# Patient Record
Sex: Female | Born: 1960
Health system: Southern US, Community
[De-identification: ages and names within clinical notes are randomized; demographics above are authoritative.]

## PROBLEM LIST (undated history)

## (undated) DIAGNOSIS — K769 Liver disease, unspecified: Secondary | ICD-10-CM

## (undated) DIAGNOSIS — F419 Anxiety disorder, unspecified: Secondary | ICD-10-CM

## (undated) DIAGNOSIS — D649 Anemia, unspecified: Secondary | ICD-10-CM

## (undated) DIAGNOSIS — E7800 Pure hypercholesterolemia, unspecified: Secondary | ICD-10-CM

## (undated) DIAGNOSIS — Z803 Family history of malignant neoplasm of breast: Secondary | ICD-10-CM

## (undated) DIAGNOSIS — G4700 Insomnia, unspecified: Secondary | ICD-10-CM

## (undated) DIAGNOSIS — E785 Hyperlipidemia, unspecified: Secondary | ICD-10-CM

## (undated) DIAGNOSIS — R12 Heartburn: Secondary | ICD-10-CM

## (undated) DIAGNOSIS — Z800 Family history of malignant neoplasm of digestive organs: Secondary | ICD-10-CM

## (undated) DIAGNOSIS — K5900 Constipation, unspecified: Secondary | ICD-10-CM

## (undated) DIAGNOSIS — D472 Monoclonal gammopathy: Secondary | ICD-10-CM

## (undated) DIAGNOSIS — R232 Flushing: Secondary | ICD-10-CM

## (undated) DIAGNOSIS — I10 Essential (primary) hypertension: Secondary | ICD-10-CM

## (undated) DIAGNOSIS — R002 Palpitations: Secondary | ICD-10-CM

## (undated) DIAGNOSIS — M792 Neuralgia and neuritis, unspecified: Secondary | ICD-10-CM

## (undated) DIAGNOSIS — R0602 Shortness of breath: Secondary | ICD-10-CM

## (undated) DIAGNOSIS — R079 Chest pain, unspecified: Secondary | ICD-10-CM

## (undated) DIAGNOSIS — E559 Vitamin D deficiency, unspecified: Secondary | ICD-10-CM

## (undated) DIAGNOSIS — R011 Cardiac murmur, unspecified: Secondary | ICD-10-CM

## (undated) DIAGNOSIS — M2550 Pain in unspecified joint: Secondary | ICD-10-CM

## (undated) DIAGNOSIS — N6320 Unspecified lump in the left breast, unspecified quadrant: Secondary | ICD-10-CM

## (undated) DIAGNOSIS — Z8041 Family history of malignant neoplasm of ovary: Secondary | ICD-10-CM

## (undated) HISTORY — DX: Anemia, unspecified: D64.9

## (undated) HISTORY — DX: Chest pain, unspecified: R07.9

## (undated) HISTORY — DX: Flushing: R23.2

## (undated) HISTORY — DX: Palpitations: R00.2

## (undated) HISTORY — DX: Shortness of breath: R06.02

## (undated) HISTORY — PX: OTHER SURGICAL HISTORY: SHX169

## (undated) HISTORY — DX: Unspecified lump in the left breast, unspecified quadrant: N63.20

## (undated) HISTORY — DX: Cardiac murmur, unspecified: R01.1

## (undated) HISTORY — DX: Hyperlipidemia, unspecified: E78.5

## (undated) HISTORY — DX: Pain in unspecified joint: M25.50

## (undated) HISTORY — DX: Insomnia, unspecified: G47.00

## (undated) HISTORY — DX: Liver disease, unspecified: K76.9

## (undated) HISTORY — PX: OOPHORECTOMY: SHX86

## (undated) HISTORY — DX: Family history of malignant neoplasm of digestive organs: Z80.0

## (undated) HISTORY — DX: Constipation, unspecified: K59.00

## (undated) HISTORY — DX: Family history of malignant neoplasm of breast: Z80.3

## (undated) HISTORY — PX: HYSTERECTOMY ABDOMINAL WITH SALPINGECTOMY: SHX6725

## (undated) HISTORY — DX: Vitamin D deficiency, unspecified: E55.9

## (undated) HISTORY — DX: Essential (primary) hypertension: I10

## (undated) HISTORY — DX: Neuralgia and neuritis, unspecified: M79.2

## (undated) HISTORY — DX: Pure hypercholesterolemia, unspecified: E78.00

## (undated) HISTORY — DX: Heartburn: R12

## (undated) HISTORY — DX: Family history of malignant neoplasm of ovary: Z80.41

## (undated) HISTORY — DX: Monoclonal gammopathy: D47.2

## (undated) HISTORY — DX: Anxiety disorder, unspecified: F41.9

---

## 1980-06-06 HISTORY — PX: APPENDECTOMY: SHX54

## 1997-06-06 HISTORY — PX: BREAST BIOPSY: SHX20

## 1998-06-06 HISTORY — PX: OVARY SURGERY: SHX727

## 1999-06-07 HISTORY — PX: ABDOMINAL HYSTERECTOMY: SHX81

## 2004-09-17 ENCOUNTER — Ambulatory Visit: Payer: Self-pay | Admitting: Internal Medicine

## 2004-11-02 ENCOUNTER — Ambulatory Visit: Payer: Self-pay | Admitting: Internal Medicine

## 2004-11-18 ENCOUNTER — Ambulatory Visit: Payer: Self-pay | Admitting: Internal Medicine

## 2005-07-05 ENCOUNTER — Ambulatory Visit: Payer: Self-pay | Admitting: Unknown Physician Specialty

## 2006-07-25 ENCOUNTER — Ambulatory Visit: Payer: Self-pay | Admitting: Internal Medicine

## 2007-08-02 ENCOUNTER — Ambulatory Visit: Payer: Self-pay | Admitting: Unknown Physician Specialty

## 2007-09-20 ENCOUNTER — Ambulatory Visit: Payer: Self-pay | Admitting: Unknown Physician Specialty

## 2007-09-25 ENCOUNTER — Inpatient Hospital Stay: Payer: Self-pay | Admitting: Unknown Physician Specialty

## 2008-10-15 ENCOUNTER — Ambulatory Visit: Payer: Self-pay | Admitting: Unknown Physician Specialty

## 2010-04-21 ENCOUNTER — Ambulatory Visit: Payer: Self-pay | Admitting: Unknown Physician Specialty

## 2011-05-18 ENCOUNTER — Ambulatory Visit: Payer: Self-pay | Admitting: Unknown Physician Specialty

## 2012-06-13 ENCOUNTER — Ambulatory Visit: Payer: Self-pay | Admitting: General Surgery

## 2012-06-13 HISTORY — PX: COLONOSCOPY: SHX174

## 2014-06-06 DIAGNOSIS — E559 Vitamin D deficiency, unspecified: Secondary | ICD-10-CM

## 2014-06-06 HISTORY — DX: Vitamin D deficiency, unspecified: E55.9

## 2014-08-15 ENCOUNTER — Encounter: Payer: Self-pay | Admitting: *Deleted

## 2014-08-25 ENCOUNTER — Encounter: Payer: Self-pay | Admitting: General Surgery

## 2014-08-26 ENCOUNTER — Ambulatory Visit (INDEPENDENT_AMBULATORY_CARE_PROVIDER_SITE_OTHER): Payer: 59 | Admitting: General Surgery

## 2014-08-26 ENCOUNTER — Encounter: Payer: Self-pay | Admitting: General Surgery

## 2014-08-26 VITALS — BP 130/70 | HR 76 | Resp 14 | Ht 62.0 in | Wt 175.0 lb

## 2014-08-26 DIAGNOSIS — R195 Other fecal abnormalities: Secondary | ICD-10-CM | POA: Diagnosis not present

## 2014-08-26 NOTE — Progress Notes (Signed)
Patient ID: Andrea Jones, female   DOB: 08/02/1960, 54 y.o.   MRN: [id number]  Chief Complaint  Patient presents with  . Other    positive hemmocult    HPI Andrea Jones is a 54 y.o. female. Here today for evaluation of blood in stool. Patient saw Dr. Ammie Dalton in [place]. Positive  Hemoccult. Repeat stool sample positive on [facility] testing  Patient has not noticed any blood in stool, No GI problems at this time. Change in bowel movements. The patient has no significant history of gastroesophageal reflux. No history of abdominal pain, distention. HPI  Past Medical History  Diagnosis Date  . Hyperlipidemia   . Vitamin D deficiency 2016    Past Surgical History  Procedure Laterality Date  . Appendectomy  1982  . Abdominal hysterectomy  2001  . Ovary surgery  2000  . Breast biopsy Left 1999    cyst  . Cesarean section  1985  . Colonoscopy  06/13/2012    [facility]. Normal Exam.    Family History  Problem Relation Age of Onset  . Colon cancer Father 45    Social History History  Substance Use Topics  . Smoking status: Never Smoker   . Smokeless tobacco: Not on file  . Alcohol Use: No    No Known Allergies  Current Outpatient Prescriptions  Medication Sig Dispense Refill  . Vitamin D, Ergocalciferol, (DRISDOL) 50000 UNITS CAPS capsule Take 50,000 Units by mouth every 7 (seven) days.     No current facility-administered medications for this visit.    Review of Systems Review of Systems  Constitutional: Negative.   Respiratory: Negative.   Cardiovascular: Negative.   Gastrointestinal: Positive for blood in stool. Negative for nausea, vomiting, abdominal pain, diarrhea, constipation, abdominal distention, anal bleeding and rectal pain.    Blood pressure 130/70, pulse 76, resp. rate 14, height 5\' 2"  (1.575 m), weight 175 lb (79.379 kg).  Physical Exam Physical Exam  Constitutional: She appears well-developed and well-nourished.  Eyes: Conjunctivae are normal.  Pupils are equal, round, and reactive to light.  Cardiovascular: Normal rate, regular rhythm and normal heart sounds.   Pulmonary/Chest: Effort normal and breath sounds normal.    Data Reviewed GYN notes.  Assessment    Heme-positive stools, unlikely colonic source.    Plan    In light of a normal colonoscopy just over 2 years ago, I think it's highly unlikely that were dealing with colonic source. Prior to beginning an upper GI/small bowel investigation the patient will complete stool Hemoccult cards 6. Dietary restrictions were reviewed with the patient. If these are negative would postpone further assessment. If positive will undertake upper endoscopy/SBFT.  6 samples hemoccult cards.     Referred by: Dr. Carollee Leitz 08/27/2014, 6:57 AM

## 2014-08-26 NOTE — Patient Instructions (Signed)
Patient to bring hemoccult cards TBA.

## 2014-08-27 ENCOUNTER — Encounter: Payer: Self-pay | Admitting: General Surgery

## 2014-08-27 DIAGNOSIS — R195 Other fecal abnormalities: Secondary | ICD-10-CM | POA: Insufficient documentation

## 2017-12-29 ENCOUNTER — Ambulatory Visit (INDEPENDENT_AMBULATORY_CARE_PROVIDER_SITE_OTHER): Payer: Managed Care, Other (non HMO) | Admitting: Obstetrics & Gynecology

## 2017-12-29 ENCOUNTER — Encounter: Payer: Self-pay | Admitting: Obstetrics & Gynecology

## 2017-12-29 VITALS — BP 140/80 | Ht 62.0 in | Wt 175.0 lb

## 2017-12-29 DIAGNOSIS — Z1211 Encounter for screening for malignant neoplasm of colon: Secondary | ICD-10-CM

## 2017-12-29 DIAGNOSIS — Z1239 Encounter for other screening for malignant neoplasm of breast: Secondary | ICD-10-CM

## 2017-12-29 DIAGNOSIS — Z01419 Encounter for gynecological examination (general) (routine) without abnormal findings: Secondary | ICD-10-CM

## 2017-12-29 DIAGNOSIS — Z1231 Encounter for screening mammogram for malignant neoplasm of breast: Secondary | ICD-10-CM | POA: Diagnosis not present

## 2017-12-29 DIAGNOSIS — Z13220 Encounter for screening for lipoid disorders: Secondary | ICD-10-CM

## 2017-12-29 DIAGNOSIS — Z1329 Encounter for screening for other suspected endocrine disorder: Secondary | ICD-10-CM

## 2017-12-29 DIAGNOSIS — Z131 Encounter for screening for diabetes mellitus: Secondary | ICD-10-CM

## 2017-12-29 DIAGNOSIS — Z0000 Encounter for general adult medical examination without abnormal findings: Secondary | ICD-10-CM

## 2017-12-29 DIAGNOSIS — Z8041 Family history of malignant neoplasm of ovary: Secondary | ICD-10-CM

## 2017-12-29 DIAGNOSIS — Z1321 Encounter for screening for nutritional disorder: Secondary | ICD-10-CM

## 2017-12-29 DIAGNOSIS — Z124 Encounter for screening for malignant neoplasm of cervix: Secondary | ICD-10-CM

## 2017-12-29 DIAGNOSIS — Z803 Family history of malignant neoplasm of breast: Secondary | ICD-10-CM

## 2017-12-29 NOTE — Patient Instructions (Signed)
PAP every three years Mammogram every year    Call [phone] to schedule at [facility] Colonoscopy every 10 years Labs soon

## 2017-12-29 NOTE — Progress Notes (Signed)
HPI:      Andrea Jones is a 57 y.o. [phone] who LMP was in the past, h/o [place] BSO years ago, she presents today for her annual examination.  The patient has no complaints today. The patient is sexually active. Herlast pap: approximate date 2016 and was normal and last mammogram: approximate date 2016 and was normal. HPV in past.  The patient does perform self breast exams.  There is notable family history of breast or ovarian cancer in her family. The patient is not taking hormone replacement therapy. Patient denies post-menopausal vaginal bleeding.   The patient has regular exercise: yes. The patient denies current symptoms of depression.    GYN Hx: Last Colonoscopy:5 years ago. Normal.  Last DEXA: never ago.    PMHx: Past Medical History:  Diagnosis Date  . Heart murmur   . Hot flashes   . Hypercholesteremia   . Hyperlipidemia   . Insomnia   . Vitamin D deficiency 2016   Past Surgical History:  Procedure Laterality Date  . ABDOMINAL HYSTERECTOMY  2001  . APPENDECTOMY  1982  . BREAST BIOPSY Left 1999   cyst  . [place]  . COLONOSCOPY  06/13/2012   [facility]. Normal Exam.  . HYSTERECTOMY ABDOMINAL WITH SALPINGECTOMY    . OVARY SURGERY  2000   Family History  Problem Relation Age of Onset  . Colon cancer Father 106  . Heart disease Father   . Congestive Heart Failure Mother   . Emphysema Mother   . Hypertension Mother   . Kidney failure Mother   . Ovarian cancer Sister 81  . Breast cancer Cousin 88   Social History   Tobacco Use  . Smoking status: Never Smoker  . Smokeless tobacco: Never Used  Substance Use Topics  . Alcohol use: No    Alcohol/week: 0.0 oz  . Drug use: No    Current Outpatient Medications:  Marland Kitchen  Vitamin D, Ergocalciferol, (DRISDOL) 50000 UNITS CAPS capsule, Take 50,000 Units by mouth every 7 (seven) days., Disp: , Rfl:  Allergies: Patient has no known allergies.  Review of Systems  Constitutional: Negative for chills, fever  and malaise/fatigue.  HENT: Negative for congestion, sinus pain and sore throat.   Eyes: Negative for blurred vision and pain.  Respiratory: Negative for cough and wheezing.   Cardiovascular: Negative for chest pain and leg swelling.  Gastrointestinal: Negative for abdominal pain, constipation, diarrhea, heartburn, nausea and vomiting.  Genitourinary: Negative for dysuria, frequency, hematuria and urgency.  Musculoskeletal: Negative for back pain, joint pain, myalgias and neck pain.  Skin: Negative for itching and rash.  Neurological: Negative for dizziness, tremors and weakness.  Endo/Heme/Allergies: Does not bruise/bleed easily.  Psychiatric/Behavioral: Negative for depression. The patient is not nervous/anxious and does not have insomnia.     Objective: BP 140/80   Ht 5\' 2"  (1.575 m)   Wt 175 lb (79.4 kg)   BMI 32.01 kg/m   Filed Weights   12/29/17 1449  Weight: 175 lb (79.4 kg)   Body mass index is 32.01 kg/m. Physical Exam  Constitutional: She is oriented to person, place, and time. She appears well-developed and well-nourished. No distress.  Genitourinary: Rectum normal and vagina normal. Pelvic exam was performed with patient supine. There is no rash or lesion on the right labia. There is no rash or lesion on the left labia. Vagina exhibits no lesion. No bleeding in the vagina. Right adnexum does not display mass and does not display tenderness. Left  adnexum does not display mass and does not display tenderness. Cervix does not exhibit motion tenderness, lesion or discharge.  Genitourinary Comments: Absent Uterus  HENT:  Head: Normocephalic and atraumatic. Head is without laceration.  Right Ear: Hearing normal.  Left Ear: Hearing normal.  Nose: No epistaxis.  No foreign bodies.  Mouth/Throat: Uvula is midline, oropharynx is clear and moist and mucous membranes are normal.  Eyes: Pupils are equal, round, and reactive to light.  Neck: Normal range of motion. Neck supple. No  thyromegaly present.  Cardiovascular: Normal rate and regular rhythm. Exam reveals no gallop and no friction rub.  No murmur heard. Pulmonary/Chest: Effort normal and breath sounds normal. No respiratory distress. She has no wheezes. Right breast exhibits no mass, no skin change and no tenderness. Left breast exhibits no mass, no skin change and no tenderness.  Abdominal: Soft. Bowel sounds are normal. She exhibits no distension. There is no tenderness. There is no rebound.  Musculoskeletal: Normal range of motion.  Neurological: She is alert and oriented to person, place, and time. No cranial nerve deficit.  Skin: Skin is warm and dry.  Psychiatric: She has a normal mood and affect. Judgment normal.  Vitals reviewed.  Assessment: Annual Exam 1. Annual physical exam   2. Screening for breast cancer   3. Screening for cervical cancer   4. Screen for colon cancer   5. Screening for diabetes mellitus   6. Screening for cholesterol level   7. Screening for thyroid disorder   8. Encounter for vitamin deficiency screening    Plan:            1.  Cervical Screening-  Pap smear done today  2. Breast screening- Exam annually and mammogram scheduled  3. Colonoscopy every 10 years, Hemoccult testing after age 102  4. Labs To return fasting at a later date  5. Counseling for hormonal therapy: none    F/U  Return in about 1 year (around 12/30/2018) for Annual.  Barnett Applebaum, MD, [facility], [facility] 12/29/2017  3:15 PM

## 2018-01-04 ENCOUNTER — Telehealth: Payer: Self-pay | Admitting: Obstetrics & Gynecology

## 2018-01-04 LAB — IGP, APTIMA HPV
HPV APTIMA: POSITIVE — AB
PAP SMEAR COMMENT: 0

## 2018-01-04 LAB — FECAL OCCULT BLOOD, IMMUNOCHEMICAL: FECAL OCCULT BLD: NEGATIVE

## 2018-01-04 LAB — SPECIMEN STATUS REPORT

## 2018-01-04 NOTE — Progress Notes (Signed)
Sch Colpo w [facility]; pt aware

## 2018-01-04 NOTE — Telephone Encounter (Signed)
-----   Message from [name], MD sent at 01/04/2018  2:47 PM EDT ----- Sch Colpo w [place]; pt aware

## 2018-01-04 NOTE — Telephone Encounter (Signed)
Patient is schedule 01/19/18 with [facility]

## 2018-01-16 ENCOUNTER — Ambulatory Visit
Admission: RE | Admit: 2018-01-16 | Discharge: 2018-01-16 | Disposition: A | Discharge status: Home / Self Care | Payer: Managed Care, Other (non HMO) | Source: Ambulatory Visit | Attending: Obstetrics & Gynecology | Admitting: Obstetrics & Gynecology

## 2018-01-16 DIAGNOSIS — Z1231 Encounter for screening mammogram for malignant neoplasm of breast: Secondary | ICD-10-CM | POA: Insufficient documentation

## 2018-01-16 DIAGNOSIS — Z1239 Encounter for other screening for malignant neoplasm of breast: Secondary | ICD-10-CM

## 2018-01-19 ENCOUNTER — Other Ambulatory Visit (HOSPITAL_COMMUNITY)
Admission: RE | Admit: 2018-01-19 | Discharge: 2018-01-19 | Disposition: A | Discharge status: Home / Self Care | Payer: Managed Care, Other (non HMO) | Source: Ambulatory Visit | Attending: Obstetrics & Gynecology | Admitting: Obstetrics & Gynecology

## 2018-01-19 ENCOUNTER — Encounter: Payer: Self-pay | Admitting: Obstetrics & Gynecology

## 2018-01-19 ENCOUNTER — Ambulatory Visit (INDEPENDENT_AMBULATORY_CARE_PROVIDER_SITE_OTHER): Payer: Managed Care, Other (non HMO) | Admitting: Obstetrics & Gynecology

## 2018-01-19 VITALS — BP 130/80 | Ht 62.0 in | Wt 172.0 lb

## 2018-01-19 DIAGNOSIS — R87611 Atypical squamous cells cannot exclude high grade squamous intraepithelial lesion on cytologic smear of cervix (ASC-H): Secondary | ICD-10-CM | POA: Insufficient documentation

## 2018-01-19 NOTE — Patient Instructions (Signed)

## 2018-01-19 NOTE — Progress Notes (Signed)
HPI:  Andrea Jones is a 57 y.o.  [phone]  who presents today for evaluation and management of abnormal cervical cytology.    Dysplasia History:  ASC-H, HPV+ by recent PAP    Last PAP 2017 HPV+ only    Prior PAP 2016 LGSIL, Colpo and biopsy revealed CIN I  ROS:  Pertinent items noted in HPI and remainder of comprehensive ROS otherwise negative.  OB History  Gravida Para Term Preterm AB Living  3 3 3     3   SAB TAB Ectopic Multiple Live Births               # Outcome Date GA Lbr Len/2nd Weight Sex Delivery Anes PTL Lv  3 Term 05/24/89   8 lb 2.6 oz (3.701 kg) M CS-Unspec     2 Term 12/06/86   7 lb 1.8 oz (3.225 kg) F Vag-Spont     1 Term 01/21/84   8 lb 9.6 oz (3.901 kg) F Vag-Spont       Past Medical History:  Diagnosis Date  . Heart murmur   . Hot flashes   . Hypercholesteremia   . Hyperlipidemia   . Insomnia   . Vitamin D deficiency 2016    Past Surgical History:  Procedure Laterality Date  . ABDOMINAL HYSTERECTOMY  2001  . APPENDECTOMY  1982  . BREAST BIOPSY Left 1999   cyst,benign  . CESAREAN SECTION  1985  . COLONOSCOPY  06/13/2012   [facility]. Normal Exam.  . HYSTERECTOMY ABDOMINAL WITH SALPINGECTOMY    . OOPHORECTOMY    . OVARY SURGERY  2000    SOCIAL HISTORY: Social History   Substance and Sexual Activity  Alcohol Use No  . Alcohol/week: 0.0 standard drinks   Social History   Substance and Sexual Activity  Drug Use No     Family History  Problem Relation Age of Onset  . Colon cancer Father 45  . Heart disease Father   . Congestive Heart Failure Mother   . Emphysema Mother   . Hypertension Mother   . Kidney failure Mother   . Ovarian cancer Sister 2  . Breast cancer Cousin 46    ALLERGIES:  Patient has no known allergies.  Current Outpatient Medications on File Prior to Visit  Medication Sig Dispense Refill  . Vitamin D, Ergocalciferol, (DRISDOL) 50000 UNITS CAPS capsule Take 50,000 Units by mouth every 7 (seven) days.      No current facility-administered medications on file prior to visit.     Physical Exam: -Vitals:  There were no vitals taken for this visit. GEN: WD, WN, NAD.  A+ O x 3, good mood and affect. ABD:  NT, ND.  Soft, no masses.  No hernias noted.   Pelvic:   Vulva: Normal appearance.  No lesions.  Vagina: No lesions or abnormalities noted.  Support: Normal pelvic support.  Urethra No masses tenderness or scarring.  Meatus Normal size without lesions or prolapse.  Cervix: See below.  Anus: Normal exam.  No lesions.  Perineum: Normal exam.  No lesions.        Bimanual   Uterus: Normal size.  Non-tender.  Mobile.  AV.  Adnexae: No masses.  Non-tender to palpation.  Cul-de-sac: Negative for abnormality.   PROCEDURE: 1.  Urine Pregnancy Test:  not done 2.  Colposcopy performed with 4% acetic acid after verbal consent obtained                                         -  Aceto-white Lesions Location(s): none.              -Biopsy performed at 6, 12 o'clock               -ECC indicated and performed: Yes.       -Biopsy sites made hemostatic with pressure, AgNO3, and/or Monsel's solution   -Satisfactory colposcopy: Yes.      -Evidence of Invasive cervical CA :  NO  ASSESSMENT:  Andrea Jones is a 57 y.o. [phone] here for  1. Atypical squamous cells cannot exclude high grade squamous intraepithelial lesion on cytologic smear of cervix (ASC-H)   .  PLAN: 1.  I discussed the grading system of pap smears and HPV high risk viral types.  We will discuss and base management after colpo results return. 2. Follow up PAP 6 months, vs intervention if high grade dysplasia identified 3. Treatment of persistantly abnormal PAP smears and cervical dysplasia, even mild, is discussed w pt today in detail, as well as the pros and cons of Cryo and LEEP and even Trachelectomy procedures. Will consider and discuss after results.      Barnett Applebaum, MD, [facility], [facility] 01/19/2018  7:53 AM

## 2018-01-22 ENCOUNTER — Other Ambulatory Visit: Payer: Self-pay | Admitting: Obstetrics & Gynecology

## 2018-01-22 NOTE — Progress Notes (Signed)
Pathology from Colposcopy/ Biopsy recently done: Diagnosis 1. Endocervix, curettage - FRAGMENTS OF DYSPLASTIC SQUAMOUS EPITHELIUM. - BENIGN ENDOCERVICAL TYPE MUCOSA. - SEE COMMENT. 2. Cervix, biopsy, at 6 o'clock - BENIGN SQUAMOUS MUCOSA. - THERE IS NO EVIDENCE OF MALIGNANCY. - SEE COMMENT. 3. Cervix, biopsy, at 12 o'clock - INFLAMED TRANSITIONAL ZONE MUCOSA. - THERE IS NO EVIDENCE OF MALIGNANCY. D/W pt, f/u PAP 6 mos  Barnett Applebaum, MD, [place], [facility] 01/22/2018  1:23 PM

## 2018-01-22 NOTE — Progress Notes (Signed)
LM

## 2018-01-24 ENCOUNTER — Encounter: Payer: Self-pay | Admitting: Obstetrics and Gynecology

## 2018-02-08 ENCOUNTER — Encounter: Payer: Self-pay | Admitting: Obstetrics & Gynecology

## 2018-07-13 ENCOUNTER — Encounter: Payer: Self-pay | Admitting: Obstetrics & Gynecology

## 2018-07-13 ENCOUNTER — Ambulatory Visit (INDEPENDENT_AMBULATORY_CARE_PROVIDER_SITE_OTHER): Payer: 59 | Admitting: Obstetrics & Gynecology

## 2018-07-13 ENCOUNTER — Other Ambulatory Visit (HOSPITAL_COMMUNITY)
Admission: RE | Admit: 2018-07-13 | Discharge: 2018-07-13 | Disposition: A | Discharge status: Home / Self Care | Payer: 59 | Source: Ambulatory Visit | Attending: Obstetrics & Gynecology | Admitting: Obstetrics & Gynecology

## 2018-07-13 VITALS — BP 138/90 | Ht 62.0 in | Wt 178.0 lb

## 2018-07-13 DIAGNOSIS — R87611 Atypical squamous cells cannot exclude high grade squamous intraepithelial lesion on cytologic smear of cervix (ASC-H): Secondary | ICD-10-CM | POA: Insufficient documentation

## 2018-07-13 NOTE — Progress Notes (Signed)
HPI:  Patient is a 58 y.o. [id number] presenting for follow up evaluation of abnormal PAP smear in the past.  Prior [facility].    Last PAP 01/2018 ASC-H, HPV+       Colpo/Bx- 1. Endocervix, curettage - FRAGMENTS OF DYSPLASTIC SQUAMOUS EPITHELIUM. - BENIGN ENDOCERVICAL TYPE MUCOSA. - SEE COMMENT. 2. Cervix, biopsy, at 6 o'clock - BENIGN SQUAMOUS MUCOSA. - THERE IS NO EVIDENCE OF MALIGNANCY. - SEE COMMENT. 3. Cervix, biopsy, at 12 o'clock - INFLAMED TRANSITIONAL ZONE MUCOSA. - THERE IS NO EVIDENCE OF MALIGNANCY.        Prior PAP 2017 HPV+ only    Prior PAP 2016 LGSIL, Colpo and biopsy revealed CIN I  PMHx: She  has a past medical history of Family history of ovarian cancer, Heart murmur, Hot flashes, Hypercholesteremia, Hyperlipidemia, Insomnia, and Vitamin D deficiency (2016). Also,  has a past surgical history that includes Appendectomy (1982); Abdominal hysterectomy (2001); Ovary surgery (2000); Cesarean section (1985); Colonoscopy (06/13/2012); Hysterectomy abdominal with salpingectomy; Oophorectomy; and Breast biopsy (Left, 1999)., family history includes Breast cancer (age of onset: 42) in her cousin; Colon cancer (age of onset: 65) in her father; Congestive Heart Failure in her mother; Emphysema in her mother; Heart disease in her father; Hypertension in her mother; Kidney failure in her mother; Ovarian cancer (age of onset: 59) in her sister.,  reports that she has never smoked. She has never used smokeless tobacco. She reports that she does not drink alcohol or use drugs.  She has a current medication list which includes the following prescription(s): vitamin d (ergocalciferol). Also, has No Known Allergies.  Review of Systems  All other systems reviewed and are negative.  Objective: BP 138/90   Ht 5\' 2"  (1.575 m)   Wt 178 lb (80.7 kg)   BMI 32.56 kg/m  Filed Weights   07/13/18 0811  Weight: 178 lb (80.7 kg)   Body mass index is 32.56 kg/m.  Physical examination Physical  Exam Constitutional:      General: She is not in acute distress.    Appearance: She is well-developed.  Genitourinary:     Pelvic exam was performed with patient supine.     Vagina normal.     No vaginal erythema or bleeding.     No cervical motion tenderness, discharge, polyp or nabothian cyst.     Uterus is absent.  HENT:     Head: Normocephalic and atraumatic.     Nose: Nose normal.  Abdominal:     General: There is no distension.     Palpations: Abdomen is soft.     Tenderness: There is no abdominal tenderness.  Musculoskeletal: Normal range of motion.  Neurological:     Mental Status: She is alert and oriented to person, place, and time.     Cranial Nerves: No cranial nerve deficit.  Skin:    General: Skin is warm and dry.   ASSESSMENT:  History of Cervical Dysplasia, Prior CIN I  Plan:  1.  I discussed the grading system of pap smears and HPV high risk viral types.   2. Follow up PAP 6 months, vs intervention if high grade dysplasia identified. 3. Treatment of persistantly abnormal PAP smears and cervical dysplasia, even mild, is discussed w pt today in detail, as well as the pros and cons of Cryo and LEEP procedures. Will consider and discuss after results.  A total of 15 minutes were spent face-to-face with the patient during this encounter and over half of that time dealt  with counseling and coordination of care.  Barnett Applebaum, MD, [facility], [facility] 07/13/2018  8:13 AM

## 2018-07-17 LAB — CYTOLOGY - PAP: Diagnosis: NEGATIVE

## 2019-01-10 ENCOUNTER — Other Ambulatory Visit: Payer: Self-pay

## 2019-01-10 ENCOUNTER — Encounter: Payer: Self-pay | Admitting: Obstetrics & Gynecology

## 2019-01-10 ENCOUNTER — Ambulatory Visit (INDEPENDENT_AMBULATORY_CARE_PROVIDER_SITE_OTHER): Payer: 59 | Admitting: Obstetrics & Gynecology

## 2019-01-10 VITALS — BP 120/90 | Ht 62.0 in | Wt 174.8 lb

## 2019-01-10 DIAGNOSIS — G629 Polyneuropathy, unspecified: Secondary | ICD-10-CM

## 2019-01-10 DIAGNOSIS — Z01419 Encounter for gynecological examination (general) (routine) without abnormal findings: Secondary | ICD-10-CM

## 2019-01-10 DIAGNOSIS — Z1239 Encounter for other screening for malignant neoplasm of breast: Secondary | ICD-10-CM

## 2019-01-10 DIAGNOSIS — R87611 Atypical squamous cells cannot exclude high grade squamous intraepithelial lesion on cytologic smear of cervix (ASC-H): Secondary | ICD-10-CM

## 2019-01-10 DIAGNOSIS — Z1211 Encounter for screening for malignant neoplasm of colon: Secondary | ICD-10-CM

## 2019-01-10 NOTE — Patient Instructions (Addendum)
PAP every year Mammogram every year    Call [phone] to schedule at [facility] Colonoscopy every 10 years Labs yearly (with PCP or [facility])  Referral - Neurology Dr Jaynee Eagles (or partner)

## 2019-01-10 NOTE — Progress Notes (Signed)
HPI:      [name] is a 58 y.o. [phone] who LMP was in the past as she has had prior LSH/BSO, she presents today for her annual examination.  The patient has no complaints today. The patient is sexually active. Herlast pap: approximate date 07/2018 and was normal and last year PAP was ASC-H (w normal biopsies) and last mammogram: approximate date 2019 and was normal.  The patient does perform self breast exams.  There is no notable family history of breast or ovarian cancer in her family. The patient is not taking hormone replacement therapy. Patient denies post-menopausal vaginal bleeding.   The patient has regular exercise: yes. The patient denies current symptoms of depression.    Reports over the last year bilateral numbness and tingling of the big toes that is intermittent and not associated with anything. Normal testing for diabetes yearly.  FH (mother) of peripheral neuropathy so is concerned.  GYN Hx: Last Colonoscopy:8 yrs ago. Normal.  Last DEXA: never ago.    PMHx: Past Medical History:  Diagnosis Date  . Family history of ovarian cancer    8/19 genetic testing letter sent  . Heart murmur   . Hot flashes   . Hypercholesteremia   . Hyperlipidemia   . Insomnia   . Vitamin D deficiency 2016   Past Surgical History:  Procedure Laterality Date  . ABDOMINAL HYSTERECTOMY  2001  . APPENDECTOMY  1982  . BREAST BIOPSY Left 1999   cyst,benign  . CESAREAN SECTION  1985  . COLONOSCOPY  06/13/2012   [facility]. Normal Exam.  . HYSTERECTOMY ABDOMINAL WITH SALPINGECTOMY    . OOPHORECTOMY    . OVARY SURGERY  2000   Family History  Problem Relation Age of Onset  . Colon cancer Father 55  . Heart disease Father   . Congestive Heart Failure Mother   . Emphysema Mother   . Hypertension Mother   . Kidney failure Mother   . Ovarian cancer Sister 60       pat 1/2 sister  . Breast cancer Cousin 25   Social History   Tobacco Use  . Smoking status: Never Smoker  .  Smokeless tobacco: Never Used  Substance Use Topics  . Alcohol use: No    Alcohol/week: 0.0 standard drinks  . Drug use: No    Current Outpatient Medications:  Marland Kitchen  Vitamin D, Ergocalciferol, (DRISDOL) 50000 UNITS CAPS capsule, Take 50,000 Units by mouth every 7 (seven) days., Disp: , Rfl:  Allergies: Patient has no known allergies.  Review of Systems  Constitutional: Negative for chills, fever and malaise/fatigue.  HENT: Negative for congestion, sinus pain and sore throat.   Eyes: Positive for blurred vision. Negative for pain.  Respiratory: Negative for cough and wheezing.   Cardiovascular: Negative for chest pain and leg swelling.  Gastrointestinal: Negative for abdominal pain, constipation, diarrhea, heartburn, nausea and vomiting.  Genitourinary: Negative for dysuria, frequency, hematuria and urgency.  Musculoskeletal: Negative for back pain, joint pain, myalgias and neck pain.  Skin: Negative for itching and rash.  Neurological: Positive for tingling. Negative for dizziness, tremors and weakness.  Endo/Heme/Allergies: Does not bruise/bleed easily.  Psychiatric/Behavioral: Negative for depression. The patient is not nervous/anxious and does not have insomnia.     Objective: BP 120/90   Ht 5\' 2"  (1.575 m)   Wt 174 lb 12.8 oz (79.3 kg)   BMI 31.97 kg/m   Filed Weights   01/10/19 0805  Weight: 174 lb 12.8 oz (79.3 kg)  Body mass index is 31.97 kg/m. Physical Exam Constitutional:      General: She is not in acute distress.    Appearance: She is well-developed.  Genitourinary:     Pelvic exam was performed with patient supine.     Vagina and rectum normal.     No lesions in the vagina.     No vaginal bleeding.     No cervical motion tenderness, friability, lesion or polyp.     Uterus is absent.     No right or left adnexal mass present.     Right adnexa not tender.     Left adnexa not tender.     Genitourinary Comments: Cervix flush w vaginal apex  HENT:     Head:  Normocephalic and atraumatic. No laceration.     Right Ear: Hearing normal.     Left Ear: Hearing normal.     Mouth/Throat:     Pharynx: Uvula midline.  Eyes:     Pupils: Pupils are equal, round, and reactive to light.  Neck:     Musculoskeletal: Normal range of motion and neck supple.     Thyroid: No thyromegaly.  Cardiovascular:     Rate and Rhythm: Normal rate and regular rhythm.     Heart sounds: No murmur. No friction rub. No gallop.   Pulmonary:     Effort: Pulmonary effort is normal. No respiratory distress.     Breath sounds: Normal breath sounds. No wheezing.  Chest:     Breasts:        Right: No mass, skin change or tenderness.        Left: No mass, skin change or tenderness.  Abdominal:     General: Bowel sounds are normal. There is no distension.     Palpations: Abdomen is soft.     Tenderness: There is no abdominal tenderness. There is no rebound.  Musculoskeletal: Normal range of motion.  Neurological:     Mental Status: She is alert and oriented to person, place, and time.     Cranial Nerves: No cranial nerve deficit.  Skin:    General: Skin is warm and dry.  Psychiatric:        Judgment: Judgment normal.  Vitals signs reviewed.     Assessment: Annual Exam 1. Women's annual routine gynecological examination   2. Atypical squamous cells cannot exclude high grade squamous intraepithelial lesion on cytologic smear of cervix (ASC-H)   3. Screening for breast cancer   4. Screen for colon cancer     Plan:            1.  Cervical Screening-  Pap smear done today  2. Breast screening- Exam annually and mammogram scheduled  3. Colonoscopy every 10 years, Hemoccult testing after age 20  4. Labs managed by PCP  5. Counseling for hormonal therapy: no change in therapy today              6. FRAX - FRAX score for assessing the 10 year probability for fracture calculated and discussed today.  Based on age and score today, DEXA is not currently scheduled.     F/U  Return in about 1 year (around 01/10/2020) for Annual.  Barnett Applebaum, MD, [facility], [facility] 01/10/2019  8:16 AM

## 2019-01-17 LAB — PAP IG (IMAGE GUIDED)

## 2019-02-13 LAB — FECAL OCCULT BLOOD, IMMUNOCHEMICAL

## 2019-02-22 ENCOUNTER — Other Ambulatory Visit: Payer: Self-pay

## 2019-02-22 ENCOUNTER — Ambulatory Visit
Admission: RE | Admit: 2019-02-22 | Discharge: 2019-02-22 | Disposition: A | Discharge status: Home / Self Care | Payer: 59 | Source: Ambulatory Visit | Attending: Obstetrics & Gynecology | Admitting: Obstetrics & Gynecology

## 2019-02-22 DIAGNOSIS — Z1231 Encounter for screening mammogram for malignant neoplasm of breast: Secondary | ICD-10-CM | POA: Insufficient documentation

## 2019-02-22 DIAGNOSIS — Z1239 Encounter for other screening for malignant neoplasm of breast: Secondary | ICD-10-CM

## 2019-02-25 ENCOUNTER — Other Ambulatory Visit: Payer: Self-pay

## 2019-02-25 ENCOUNTER — Other Ambulatory Visit: Payer: Self-pay | Admitting: Obstetrics & Gynecology

## 2019-02-25 ENCOUNTER — Encounter: Payer: Self-pay | Admitting: Neurology

## 2019-02-25 ENCOUNTER — Ambulatory Visit: Payer: 59 | Admitting: Neurology

## 2019-02-25 VITALS — BP 123/72 | HR 59 | Temp 97.3°F | Ht 62.0 in | Wt 178.0 lb

## 2019-02-25 DIAGNOSIS — R928 Other abnormal and inconclusive findings on diagnostic imaging of breast: Secondary | ICD-10-CM

## 2019-02-25 DIAGNOSIS — G609 Hereditary and idiopathic neuropathy, unspecified: Secondary | ICD-10-CM

## 2019-02-25 DIAGNOSIS — R200 Anesthesia of skin: Secondary | ICD-10-CM

## 2019-02-25 DIAGNOSIS — N6310 Unspecified lump in the right breast, unspecified quadrant: Secondary | ICD-10-CM

## 2019-02-25 DIAGNOSIS — G629 Polyneuropathy, unspecified: Secondary | ICD-10-CM | POA: Diagnosis not present

## 2019-02-25 NOTE — Progress Notes (Signed)
[facility]    Provider:  Dr Jaynee Eagles Requesting Provider: [name], MD Primary Care Provider:  Teresa Coombs MD  CC:  Sensory symptoms in the great toes  HPI:  Andrea Jones is a 58 y.o. female here as requested by [name], MD for peripheral neuropathy. PMHx heart murmur, hot flashes, hypercholesterolemia/lipidemia, insomnia, vit d def, abnormal PAP smears and cervical dysplasia. Mother had peripheral neuropathy, passed away at 90, she had significant pain in her feet, she did not have diabetes, mother ended up in a wheelchair and she could not walk without her shoes, she had a lot of numbness on her feet and had a problem wit balance, she had a lot of burning and sensitivity in the feet to touch. Patient has started getting symptoms in her 2 big toes, numbness, pain, no burning but it has scared her, currently no symptoms "comes and goes", not worse with sitting, standing walking, unclear when she feels it and she can't tell me if anything makes it worse or better, she exercised too which doesn't seem to exacerbate it. Mostly on the sides of the great toes bilaterally, she does not wear tight shows or heels, The bottoms of her mother's feet were flat, her toes were a little crooked, she had a problem with her toe nails never healthy, radiated up into the legs, mother was on gabapentin, she doesn't know if her mother was ever diagnosed but she was taking B12 shots. Patient's toes started being symptomatic for a few months, she started taking supplements, she started the B vitamins after the symptoms, she started the immune health in March a few months before symptoms. She has some pain in her thumbs, no numbness or sensory changes she thinking it may be arthritis or ovreruse unclear but no sensory symptoms. No other focal neurologic deficits, associated symptoms, inciting events or modifiable factors.  Reviewed notes, labs and imaging from outside physicians,  which showed:  I reviewed multiple notes from Dr. Kenton Kingfisher from 2019-2020.  Reports over the last year bilateral numbness and tingling of the big toes that is intermittent and not associated with anything.  Normal testing for diabetes yearly.  Family history of peripheral neuropathy in the mother so patient is concerned.  Normal diabetic testing per Dr. Kenton Kingfisher' notes but I don't see any labs in [facility].  Never smoked.  No alcohol use or drug use. Review of symptoms positive for tingling neurologically otherwise negative.  She had labs about amonth ago, will request  Review of Systems: Patient complains of symptoms per HPI as well as the following symptoms: sensory changes in the great toes. Pertinent negatives and positives per HPI. All others negative.   Social History   Socioeconomic History  . Marital status: Married    Spouse name: Not on file  . Number of children: Not on file  . Years of education: Not on file  . Highest education level: Not on file  Occupational History  . Not on file  Social Needs  . Financial resource strain: Not on file  . Food insecurity    Worry: Not on file    Inability: Not on file  . Transportation needs    Medical: Not on file    Non-medical: Not on file  Tobacco Use  . Smoking status: Never Smoker  . Smokeless tobacco: Never Used  Substance and Sexual Activity  . Alcohol use: No    Alcohol/week: 0.0 standard drinks  . Drug use: No  .  Sexual activity: Not Currently  Lifestyle  . Physical activity    Days per week: Not on file    Minutes per session: Not on file  . Stress: Not on file  Relationships  . Social Herbalist on phone: Not on file    Gets together: Not on file    Attends religious service: Not on file    Active member of club or organization: Not on file    Attends meetings of clubs or organizations: Not on file    Relationship status: Not on file  . Intimate partner violence    Fear of current or ex partner: Not on  file    Emotionally abused: Not on file    Physically abused: Not on file    Forced sexual activity: Not on file  Other Topics Concern  . Not on file  Social History Narrative   Lives at home with husband    Right handed    Family History  Problem Relation Age of Onset  . Colon cancer Father 58  . Heart disease Father   . Congestive Heart Failure Mother   . Emphysema Mother   . Hypertension Mother   . Kidney failure Mother   . Neuropathy Mother   . Ovarian cancer Sister 19       pat 1/2 sister  . Breast cancer Cousin 53    Past Medical History:  Diagnosis Date  . Family history of ovarian cancer    8/19 genetic testing letter sent  . Heart murmur    hx of per pt, "hasn't presented itself in years"  . Hot flashes    "pretty much gone"  . Hypercholesteremia   . Hyperlipidemia   . Insomnia   . Vitamin D deficiency 2016    Patient Active Problem List   Diagnosis Date Noted  . Atypical squamous cells cannot exclude high grade squamous intraepithelial lesion on cytologic smear of cervix (ASC-H) 01/19/2018  . Heme positive stool 08/27/2014    Past Surgical History:  Procedure Laterality Date  . ABDOMINAL HYSTERECTOMY  2001  . APPENDECTOMY  1982  . BREAST BIOPSY Left 1999   cyst,benign  . CESAREAN SECTION  1985  . COLONOSCOPY  06/13/2012   [place]. Normal Exam.  . HYSTERECTOMY ABDOMINAL WITH SALPINGECTOMY    . OOPHORECTOMY    . OVARY SURGERY  2000    Current Outpatient Medications  Medication Sig Dispense Refill  . Biotin 10000 MCG TABS Take 1 each by mouth daily.    Marland Kitchen CALCIUM PO Take 500 mg by mouth daily.    . Cholecalciferol (VITAMIN D3 PO) Take 1,000 Int'l Units by mouth daily.    . Chromium Picolinate 400 MCG TABS Take by mouth daily.    Marland Kitchen FIBER PO Take by mouth. Dietary fiber- 6 grams soluble fiber- 6 grams Chromium Picolinate- 200 mcg    . Omega-3 Fatty Acids (OMEGA 3 PO) Take 1,200 mg by mouth daily.    Marland Kitchen OVER THE COUNTER MEDICATION daily. IMMUNE  PLUS: Vitamin C- 180 mg  Vitamin D- 25 mcg Zinc- 5 mg    . SUPER B COMPLEX/C PO Take by mouth daily. Vitamin C- 150 mg  Thiamine- 100 mg  Riboflavin- 20 mg  Niacin- 25 mg  B6- 2 mg  Folate- 665 mcg B12- 15 mcg Biotin- 30 mcg Pantothenic Acid- 5.5 mg  Calcium- 35 mg     No current facility-administered medications for this visit.     Allergies as of  02/25/2019  . (No Known Allergies)    Vitals: BP 123/72 (BP Location: Right Arm, Patient Position: Sitting)   Pulse (!) 59   Temp (!) 97.3 F (36.3 C) Comment: taken by check-in staff  Ht 5' 2"  (1.575 m)   Wt 178 lb (80.7 kg)   BMI 32.56 kg/m  Last Weight:  Wt Readings from Last 1 Encounters:  02/25/19 178 lb (80.7 kg)   Last Height:   Ht Readings from Last 1 Encounters:  02/25/19 5' 2"  (1.575 m)     Physical exam: Exam: Gen: NAD, conversant, well nourised, obese, well groomed                     CV: RRR, no MRG. No Carotid Bruits. No peripheral edema, warm, nontender Eyes: Conjunctivae clear without exudates or hemorrhage  Neuro: Detailed Neurologic Exam  Speech:    Speech is normal; fluent and spontaneous with normal comprehension.  Cognition:    The patient is oriented to person, place, and time;     recent and remote memory intact;     language fluent;     normal attention, concentration,     fund of knowledge Cranial Nerves:    The pupils are equal, round, and reactive to light. The fundi are flat. Visual fields are full to finger confrontation. Extraocular movements are intact. Trigeminal sensation is intact and the muscles of mastication are normal. The face is symmetric. The palate elevates in the midline. Hearing intact. Voice is normal. Shoulder shrug is normal. The tongue has normal motion without fasciculations.   Coordination:    Normal finger to nose and heel to shin. Normal rapid alternating movements.   Gait:    Heel-toe and tandem gait are normal.   Motor Observation:    No asymmetry, no  atrophy, and no involuntary movements noted. Tone:    Normal muscle tone.    Posture:    Posture is normal. normal erect    Strength:    Strength is V/V in the upper and lower limbs.      Sensation: intact to LT, pin prick, vibration, temp. Neg Romberg.      Reflex Exam:  DTR's:    Deep tendon reflexes in the upper and lower extremities are normal bilaterally.   Toes:    The toes are downgoing bilaterally.   Clonus:    Clonus is absent.    Assessment/Plan:  10 58 year old with sensory symptoms in the great toes, likely small fiber of unknown etiology  Mild neuropathic small-fiber symptoms in the great toes. Will send panel bloodwork to screen for causes.  EMG/NCS would be likely negative, it does not catch small-fiber neuropathy, will not order at this time  Mother with severe neuropathy in the feet, consider genetic testing next  Patient's toes started being symptomatic a few months ago, prior she started taking supplements, (she started the B vitamins after the symptoms), she started the Immune Health in March a few months before symptoms. Stop any suplemment started before the symptoms.   Super B complex is fine and started after symptoms appeared however make sure not mega doses of B6 can be toxic and cause neuropathy. (<175m)  Mother had B12 deficiency, otherwise no diabetes and unclear why she had such severe painful neuropathy in the feet. Consider genetic testing.  Wear good arch supports or see a podiatrist just to rule out any structural foot issues.   Will schedule f/u in 8 weeks but pending  results this may change or we may order genetic testing.   Orders Placed This Encounter  Procedures  . Hemoglobin A1c  . Vitamin B6  . Vitamin B1  . Methylmalonic acid, serum  . B. burgdorfi Antibody  . TSH  . HIV Antibody (routine testing w rflx)  . Sedimentation rate  . Sjogren's syndrome antibods(ssa + ssb)  . Pan-ANCA  . B12 and Folate Panel  . RPR  .  Hepatitis C antibody  . Tissue transglutaminase, IgA  . Gliadin antibodies, serum  . Rheumatoid factor  . Heavy metals, blood  . Multiple Myeloma Panel (SPEP&IFE w/QIG)  . CBC  . Comprehensive metabolic panel  . ANA  . ANA, IFA (with reflex)    Cc: [name], MD  Sarina Ill, MD  [facility] [address] [place], [place] [address]  Phone [phone] Fax [phone]

## 2019-02-25 NOTE — Patient Instructions (Addendum)
Mild neuropathic small-fiber symptoms in the great toes. Will send panel bloodwork to screen for causes.  EMG/NCS would be likely negative, it does not catch small-fiber neuropathy, will not order at this time  Mother with severe neuropathy in the feet, consider genetic testing next  Patient's toes started being symptomatic for a few months, she started taking supplements, she started the B vitamins after the symptoms, she started the immune health in March a few months before symptoms. Stop any suplemment started before the symptoms.   Super B complex is fine and started after symptoms appeared however make sure not mega doses of B6(needs to be 100 or less) can be toxic and cause neuropathy.   Mother had B12 deficiency, otherwise no diabetes and unclear why she had such severe painful neuropathy in the feet  Wear good arch supports or see a podiatrist just to rule out any structural foot issues.     Peripheral Neuropathy Peripheral neuropathy is a type of nerve damage. It affects nerves that carry signals between the spinal cord and the arms, legs, and the rest of the body (peripheral nerves). It does not affect nerves in the spinal cord or brain. In peripheral neuropathy, one nerve or a group of nerves may be damaged. Peripheral neuropathy is a broad category that includes many specific nerve disorders, like diabetic neuropathy, hereditary neuropathy, and carpal tunnel syndrome. What are the causes? This condition may be caused by:  Diabetes. This is the most common cause of peripheral neuropathy.  Nerve injury.  Pressure or stress on a nerve that lasts a long time.  Lack (deficiency) of B vitamins. This can result from alcoholism, poor diet, or a restricted diet.  Infections.  Autoimmune diseases, such as rheumatoid arthritis and systemic lupus erythematosus.  Nerve diseases that are passed from parent to child (inherited).  Some medicines, such as cancer medicines  (chemotherapy).  Poisonous (toxic) substances, such as lead and mercury.  Too little blood flowing to the legs.  Kidney disease.  Thyroid disease. In some cases, the cause of this condition is not known. What are the signs or symptoms? Symptoms of this condition depend on which of your nerves is damaged. Common symptoms include:  Loss of feeling (numbness) in the feet, hands, or both.  Tingling in the feet, hands, or both.  Burning pain.  Very sensitive skin.  Weakness.  Not being able to move a part of the body (paralysis).  Muscle twitching.  Clumsiness or poor coordination.  Loss of balance.  Not being able to control your bladder.  Feeling dizzy.  Sexual problems. How is this diagnosed? Diagnosing and finding the cause of peripheral neuropathy can be difficult. Your health care provider will take your medical history and do a physical exam. A neurological exam will also be done. This involves checking things that are affected by your brain, spinal cord, and nerves (nervous system). For example, your health care provider will check your reflexes, how you move, and what you can feel. You may have other tests, such as:  Blood tests.  Electromyogram (EMG) and nerve conduction tests. These tests check nerve function and how well the nerves are controlling the muscles.  Imaging tests, such as CT scans or MRI to rule out other causes of your symptoms.  Removing a small piece of nerve to be examined in a lab (nerve biopsy). This is rare.  Removing and examining a small amount of the fluid that surrounds the brain and spinal cord (lumbar puncture). This  is rare. How is this treated? Treatment for this condition may involve:  Treating the underlying cause of the neuropathy, such as diabetes, kidney disease, or vitamin deficiencies.  Stopping medicines that can cause neuropathy, such as chemotherapy.  Medicine to relieve pain. Medicines may include: ? Prescription or  over-the-counter pain medicine. ? Antiseizure medicine. ? Antidepressants. ? Pain-relieving patches that are applied to painful areas of skin.  Surgery to relieve pressure on a nerve or to destroy a nerve that is causing pain.  Physical therapy to help improve movement and balance.  Devices to help you move around (assistive devices). Follow these instructions at home: Medicines  Take over-the-counter and prescription medicines only as told by your health care provider. Do not take any other medicines without first asking your health care provider.  Do not drive or use heavy machinery while taking prescription pain medicine. Lifestyle   Do not use any products that contain nicotine or tobacco, such as cigarettes and e-cigarettes. Smoking keeps blood from reaching damaged nerves. If you need help quitting, ask your health care provider.  Avoid or limit alcohol. Too much alcohol can cause a vitamin B deficiency, and vitamin B is needed for healthy nerves.  Eat a healthy diet. This includes: ? Eating foods that are high in fiber, such as fresh fruits and vegetables, whole grains, and beans. ? Limiting foods that are high in fat and processed sugars, such as fried or sweet foods. General instructions   If you have diabetes, work closely with your health care provider to keep your blood sugar under control.  If you have numbness in your feet: ? Check every day for signs of injury or infection. Watch for redness, warmth, and swelling. ? Wear padded socks and comfortable shoes. These help protect your feet.  Develop a good support system. Living with peripheral neuropathy can be stressful. Consider talking with a mental health specialist or joining a support group.  Use assistive devices and attend physical therapy as told by your health care provider. This may include using a walker or a cane.  Keep all follow-up visits as told by your health care provider. This is important.  Contact a health care provider if:  You have new signs or symptoms of peripheral neuropathy.  You are struggling emotionally from dealing with peripheral neuropathy.  Your pain is not well-controlled. Get help right away if:  You have an injury or infection that is not healing normally.  You develop new weakness in an arm or leg.  You fall frequently. Summary  Peripheral neuropathy is when the nerves in the arms, or legs are damaged, resulting in numbness, weakness, or pain.  There are many causes of peripheral neuropathy, including diabetes, pinched nerves, vitamin deficiencies, autoimmune disease, and hereditary conditions.  Diagnosing and finding the cause of peripheral neuropathy can be difficult. Your health care provider will take your medical history, do a physical exam, and do tests, including blood tests and nerve function tests.  Treatment involves treating the underlying cause of the neuropathy and taking medicines to help control pain. Physical therapy and assistive devices may also help. This information is not intended to replace advice given to you by your health care provider. Make sure you discuss any questions you have with your health care provider. Document Released: 05/13/2002 Document Revised: 05/05/2017 Document Reviewed: 08/01/2016 Elsevier Patient Education  2020 [employer].

## 2019-03-05 LAB — COMPREHENSIVE METABOLIC PANEL
ALT: 49 IU/L — ABNORMAL HIGH (ref 0–32)
AST: 31 IU/L (ref 0–40)
Albumin/Globulin Ratio: 1.6 (ref 1.2–2.2)
Albumin: 4.6 g/dL (ref 3.8–4.9)
Alkaline Phosphatase: 54 IU/L (ref 39–117)
BUN/Creatinine Ratio: 17 (ref 9–23)
BUN: 16 mg/dL (ref 6–24)
Bilirubin Total: 0.4 mg/dL (ref 0.0–1.2)
CO2: 22 mmol/L (ref 20–29)
Calcium: 9.6 mg/dL (ref 8.7–10.2)
Chloride: 103 mmol/L (ref 96–106)
Creatinine, Ser: 0.92 mg/dL (ref 0.57–1.00)
GFR calc Af Amer: 80 mL/min/{1.73_m2} (ref >59)
GFR calc non Af Amer: 69 mL/min/{1.73_m2} (ref >59)
Globulin, Total: 2.9 g/dL (ref 1.5–4.5)
Glucose: 105 mg/dL — ABNORMAL HIGH (ref 65–99)
Potassium: 4.2 mmol/L (ref 3.5–5.2)
Sodium: 142 mmol/L (ref 134–144)
Total Protein: 7.5 g/dL (ref 6.0–8.5)

## 2019-03-05 LAB — MULTIPLE MYELOMA PANEL, SERUM
Albumin SerPl Elph-Mcnc: 4.1 g/dL (ref 2.9–4.4)
Albumin/Glob SerPl: 1.3 (ref 0.7–1.7)
Alpha 1: 0.3 g/dL (ref 0.0–0.4)
Alpha2 Glob SerPl Elph-Mcnc: 0.7 g/dL (ref 0.4–1.0)
B-Globulin SerPl Elph-Mcnc: 1.9 g/dL — ABNORMAL HIGH (ref 0.7–1.3)
Gamma Glob SerPl Elph-Mcnc: 0.5 g/dL (ref 0.4–1.8)
Globulin, Total: 3.4 g/dL (ref 2.2–3.9)
IgA/Immunoglobulin A, Serum: 147 mg/dL (ref 87–352)
IgG (Immunoglobin G), Serum: 1389 mg/dL (ref 586–1602)
IgM (Immunoglobulin M), Srm: 95 mg/dL (ref 26–217)
M Protein SerPl Elph-Mcnc: 0.9 g/dL — ABNORMAL HIGH

## 2019-03-05 LAB — CBC
Hematocrit: 43.8 % (ref 34.0–46.6)
Hemoglobin: 15.2 g/dL (ref 11.1–15.9)
MCH: 30.6 pg (ref 26.6–33.0)
MCHC: 34.7 g/dL (ref 31.5–35.7)
MCV: 88 fL (ref 79–97)
Platelets: 230 10*3/uL (ref 150–450)
RBC: 4.97 x10E6/uL (ref 3.77–5.28)
RDW: 12.4 % (ref 11.7–15.4)
WBC: 6.4 10*3/uL (ref 3.4–10.8)

## 2019-03-05 LAB — B12 AND FOLATE PANEL
Folate: 16.3 ng/mL (ref >3.0)
Vitamin B-12: 715 pg/mL (ref 232–1245)

## 2019-03-05 LAB — RPR: RPR Ser Ql: NONREACTIVE

## 2019-03-05 LAB — SJOGREN'S SYNDROME ANTIBODS(SSA + SSB)
ENA SSA (RO) Ab: <0.2 AI (ref 0.0–0.9)
ENA SSB (LA) Ab: <0.2 AI (ref 0.0–0.9)

## 2019-03-05 LAB — PAN-ANCA
ANCA Proteinase 3: <3.5 U/mL (ref 0.0–3.5)
Atypical pANCA: <1:20 {titer}
C-ANCA: <1:20 {titer}
Myeloperoxidase Ab: <9 U/mL (ref 0.0–9.0)
P-ANCA: <1:20 {titer}

## 2019-03-05 LAB — TISSUE TRANSGLUTAMINASE, IGA: Transglutaminase IgA: <2 U/mL (ref 0–3)

## 2019-03-05 LAB — SEDIMENTATION RATE: Sed Rate: 24 mm/hr (ref 0–40)

## 2019-03-05 LAB — HEAVY METALS, BLOOD
Arsenic: 6 ug/L (ref 2–23)
Lead, Blood: <1 ug/dL (ref 0–4)
Mercury: <1 ug/L (ref 0.0–14.9)

## 2019-03-05 LAB — METHYLMALONIC ACID, SERUM: Methylmalonic Acid: 248 nmol/L (ref 0–378)

## 2019-03-05 LAB — B. BURGDORFI ANTIBODIES: Lyme IgG/IgM Ab: <0.91 {ISR} (ref 0.00–0.90)

## 2019-03-05 LAB — ANA: ANA Titer 1: NEGATIVE

## 2019-03-05 LAB — HEMOGLOBIN A1C
Est. average glucose Bld gHb Est-mCnc: 111 mg/dL
Hgb A1c MFr Bld: 5.5 % (ref 4.8–5.6)

## 2019-03-05 LAB — TSH: TSH: 1.78 u[IU]/mL (ref 0.450–4.500)

## 2019-03-05 LAB — RHEUMATOID FACTOR: Rhuematoid fact SerPl-aCnc: <10 IU/mL (ref 0.0–13.9)

## 2019-03-05 LAB — HIV ANTIBODY (ROUTINE TESTING W REFLEX): HIV Screen 4th Generation wRfx: NONREACTIVE

## 2019-03-05 LAB — HEPATITIS C ANTIBODY: Hep C Virus Ab: <0.1 s/co ratio (ref 0.0–0.9)

## 2019-03-05 LAB — GLIADIN ANTIBODIES, SERUM
Antigliadin Abs, IgA: 3 units (ref 0–19)
Gliadin IgG: 2 units (ref 0–19)

## 2019-03-05 LAB — VITAMIN B1: Thiamine: 222.9 nmol/L — ABNORMAL HIGH (ref 66.5–200.0)

## 2019-03-05 LAB — VITAMIN B6: Vitamin B6: 35.4 ug/L — ABNORMAL HIGH (ref 2.0–32.8)

## 2019-03-06 ENCOUNTER — Ambulatory Visit
Admission: RE | Admit: 2019-03-06 | Discharge: 2019-03-06 | Disposition: A | Discharge status: Home / Self Care | Payer: 59 | Source: Ambulatory Visit | Attending: Obstetrics & Gynecology | Admitting: Obstetrics & Gynecology

## 2019-03-06 ENCOUNTER — Other Ambulatory Visit: Payer: Self-pay | Admitting: Obstetrics & Gynecology

## 2019-03-06 DIAGNOSIS — N6310 Unspecified lump in the right breast, unspecified quadrant: Secondary | ICD-10-CM | POA: Diagnosis present

## 2019-03-06 DIAGNOSIS — R928 Other abnormal and inconclusive findings on diagnostic imaging of breast: Secondary | ICD-10-CM | POA: Insufficient documentation

## 2019-03-07 ENCOUNTER — Other Ambulatory Visit: Payer: Self-pay | Admitting: Obstetrics & Gynecology

## 2019-03-07 ENCOUNTER — Telehealth: Payer: Self-pay | Admitting: *Deleted

## 2019-03-07 DIAGNOSIS — R928 Other abnormal and inconclusive findings on diagnostic imaging of breast: Secondary | ICD-10-CM

## 2019-03-07 DIAGNOSIS — N6320 Unspecified lump in the left breast, unspecified quadrant: Secondary | ICD-10-CM

## 2019-03-07 DIAGNOSIS — D472 Monoclonal gammopathy: Secondary | ICD-10-CM

## 2019-03-07 NOTE — Telephone Encounter (Signed)
-----   Message from Melvenia Beam, MD sent at 03/05/2019 10:35 AM EDT ----- B6 is elevated, I would recommend decreasing B6 dose. Also in one lab we found an increase in a normal component of blood. This may not mean anything but I want her to be evaluated by hematology for something known as monoclonal protein or M protein - is in your blood. The protein is produced in a type of white blood cell (plasma cells) in your bone marrow. May not be significant but sometimes this disorder can cause neuropathy so I would her to see a hematologist. Thank you

## 2019-03-07 NOTE — Telephone Encounter (Signed)
I spoke with the patient and discussed the lab results. Pt also stated she had seen them on mychart and she verbalized understanding of the entire message. She understands B6 is elevated and Dr. Jaynee Eagles recommends to decrease B6 dose. She also agrees to be referred to hematology to r/o the M protein disorder. I also advised her the hematologist group is with oncology and not to be alarmed when she receives a call from their office. She verbalized appreciation.   I spoke with Dr. Jaynee Eagles and placed a verbal order for the hematology referral.

## 2019-03-15 ENCOUNTER — Inpatient Hospital Stay: Payer: 59 | Attending: Oncology | Admitting: Oncology

## 2019-03-15 ENCOUNTER — Encounter: Payer: Self-pay | Admitting: Oncology

## 2019-03-15 ENCOUNTER — Ambulatory Visit
Admission: RE | Admit: 2019-03-15 | Discharge: 2019-03-15 | Disposition: A | Discharge status: Home / Self Care | Payer: 59 | Source: Ambulatory Visit | Attending: Obstetrics & Gynecology | Admitting: Obstetrics & Gynecology

## 2019-03-15 ENCOUNTER — Other Ambulatory Visit: Payer: Self-pay

## 2019-03-15 ENCOUNTER — Ambulatory Visit
Admission: RE | Admit: 2019-03-15 | Discharge: 2019-03-15 | Disposition: A | Discharge status: Home / Self Care | Payer: 59 | Source: Home / Self Care | Attending: Oncology | Admitting: Oncology

## 2019-03-15 ENCOUNTER — Ambulatory Visit
Admission: RE | Admit: 2019-03-15 | Discharge: 2019-03-15 | Disposition: A | Discharge status: Home / Self Care | Payer: 59 | Source: Ambulatory Visit | Attending: Oncology | Admitting: Oncology

## 2019-03-15 VITALS — BP 147/81 | HR 72 | Temp 97.8°F | Resp 16 | Ht 65.0 in | Wt 180.0 lb

## 2019-03-15 DIAGNOSIS — D472 Monoclonal gammopathy: Secondary | ICD-10-CM | POA: Diagnosis not present

## 2019-03-15 DIAGNOSIS — N6320 Unspecified lump in the left breast, unspecified quadrant: Secondary | ICD-10-CM

## 2019-03-15 DIAGNOSIS — E7800 Pure hypercholesterolemia, unspecified: Secondary | ICD-10-CM | POA: Insufficient documentation

## 2019-03-15 DIAGNOSIS — E785 Hyperlipidemia, unspecified: Secondary | ICD-10-CM | POA: Insufficient documentation

## 2019-03-15 DIAGNOSIS — Z8041 Family history of malignant neoplasm of ovary: Secondary | ICD-10-CM | POA: Insufficient documentation

## 2019-03-15 DIAGNOSIS — Z79899 Other long term (current) drug therapy: Secondary | ICD-10-CM | POA: Insufficient documentation

## 2019-03-15 DIAGNOSIS — R928 Other abnormal and inconclusive findings on diagnostic imaging of breast: Secondary | ICD-10-CM

## 2019-03-15 DIAGNOSIS — G629 Polyneuropathy, unspecified: Secondary | ICD-10-CM | POA: Insufficient documentation

## 2019-03-15 DIAGNOSIS — G4700 Insomnia, unspecified: Secondary | ICD-10-CM | POA: Insufficient documentation

## 2019-03-15 DIAGNOSIS — R7989 Other specified abnormal findings of blood chemistry: Secondary | ICD-10-CM | POA: Insufficient documentation

## 2019-03-15 DIAGNOSIS — E559 Vitamin D deficiency, unspecified: Secondary | ICD-10-CM | POA: Insufficient documentation

## 2019-03-15 HISTORY — PX: BREAST BIOPSY: SHX20

## 2019-03-15 NOTE — Progress Notes (Signed)
Hematology/Oncology Consult note Andrea Jones [phone] [phone]   Patient Care Team: [name], MD as PCP - General (Obstetrics and Gynecology) Ammie Dalton, Okey Regal, MD (Unknown Physician Specialty) Bary Castilla Forest Gleason, MD (General Surgery)  REFERRING PROVIDER: Melvenia Beam, MD  CHIEF COMPLAINTS/REASON FOR VISIT:  Evaluation of abnormal SPEP  HISTORY OF PRESENTING ILLNESS:  Andrea Jones is a 58 y.o. female who was seen in consultation at the request of Melvenia Beam, MD for evaluation of abnormal SPEP results.   Patient recently had neurology work-up for 12 numbness and tingling.. Labs reviewed,  SPEP showed 0.9 g/dL M spike,   , and IFE showed IgG Kappa monoclonal protein.  No aggravating or alleviated factors.  Associated signs or symptoms:  Neuropathy: Toe numbness and tingling. Denies weight loss, fever chills, bone pain, Bone pain: Denies Anemia denies  Patient also recently had an abnormal screening mammogram on 02/22/2019 and the patient was called back to perform unilateral left diagnostic breast mammogram on 03/06/2019 Showed irregular hypoechoic mass 0.6 x 0.4 x 0.7 cm in the left breast is suspicious for malignancy.  Axillary lymph node status was not commented on mammogram or targeted ultrasound. Patient is status post left breast mass biopsy  Review of Systems  Constitutional: Negative for appetite change, chills, fatigue and fever.  HENT:   Negative for hearing loss and voice change.   Eyes: Negative for eye problems.  Respiratory: Negative for chest tightness and cough.   Cardiovascular: Negative for chest pain.  Gastrointestinal: Negative for abdominal distention, abdominal pain and blood in stool.  Endocrine: Negative for hot flashes.  Genitourinary: Negative for difficulty urinating and frequency.   Musculoskeletal: Negative for arthralgias.  Skin: Negative for itching and rash.  Neurological:  Negative for extremity weakness.  Hematological: Negative for adenopathy.  Psychiatric/Behavioral: Negative for confusion.     MEDICAL HISTORY:  Past Medical History:  Diagnosis Date   Family history of ovarian cancer    8/19 genetic testing letter sent   Heart murmur    hx of per pt, "hasn't presented itself in years"   Hot flashes    "pretty much gone"   Hypercholesteremia    Hyperlipidemia    Insomnia    Vitamin D deficiency 2016    SURGICAL HISTORY: Past Surgical History:  Procedure Laterality Date   ABDOMINAL HYSTERECTOMY  2001   APPENDECTOMY  1982   BREAST BIOPSY Left 1999   cyst,benign   BREAST BIOPSY Left 03/15/2019   [name] bx path pending venus clip   [place]   COLONOSCOPY  06/13/2012   [place]. Normal Exam.   HYSTERECTOMY ABDOMINAL WITH SALPINGECTOMY     OOPHORECTOMY     OVARY SURGERY  2000    SOCIAL HISTORY: Social History   Socioeconomic History   Marital status: Married    Spouse name: Not on file   Number of children: Not on file   Years of education: Not on file   Highest education level: Not on file  Occupational History   Not on file  Social Needs   Financial resource strain: Not on file   Food insecurity    Worry: Not on file    Inability: Not on file   Transportation needs    Medical: Not on file    Non-medical: Not on file  Tobacco Use   Smoking status: Never Smoker   Smokeless tobacco: Never Used  Substance and Sexual Activity   Alcohol use: No  Alcohol/week: 0.0 standard drinks   Drug use: No   Sexual activity: Not Currently  Lifestyle   Physical activity    Days per week: Not on file    Minutes per session: Not on file   Stress: Not on file  Relationships   Social connections    Talks on phone: Not on file    Gets together: Not on file    Attends religious service: Not on file    Active member of club or organization: Not on file    Attends meetings of clubs or  organizations: Not on file    Relationship status: Not on file   Intimate partner violence    Fear of current or ex partner: Not on file    Emotionally abused: Not on file    Physically abused: Not on file    Forced sexual activity: Not on file  Other Topics Concern   Not on file  Social History Narrative   Lives at home with husband    Right handed    FAMILY HISTORY: Family History  Problem Relation Age of Onset   Colon cancer Father 71   Heart disease Father    Congestive Heart Failure Mother    Emphysema Mother    Hypertension Mother    Kidney failure Mother    Neuropathy Mother    Ovarian cancer Sister 37       pat 1/2 sister   Breast cancer Cousin 67    ALLERGIES:  has No Known Allergies.  MEDICATIONS:  Current Outpatient Medications  Medication Sig Dispense Refill   Biotin 10000 MCG TABS Take 1 each by mouth daily.     CALCIUM PO Take 500 mg by mouth daily.     Cholecalciferol (VITAMIN D3 PO) Take 1,000 Int'l Units by mouth daily.     Chromium Picolinate 400 MCG TABS Take by mouth daily.     Omega-3 Fatty Acids (OMEGA 3 PO) Take 1,200 mg by mouth daily.     FIBER PO Take by mouth. Dietary fiber- 6 grams soluble fiber- 6 grams Chromium Picolinate- 200 mcg     OVER THE COUNTER MEDICATION daily. IMMUNE PLUS: Vitamin C- 180 mg  Vitamin D- 25 mcg Zinc- 5 mg     SUPER B COMPLEX/C PO Take by mouth daily. Vitamin C- 150 mg  Thiamine- 100 mg  Riboflavin- 20 mg  Niacin- 25 mg  B6- 2 mg  Folate- 665 mcg B12- 15 mcg Biotin- 30 mcg Pantothenic Acid- 5.5 mg  Calcium- 35 mg     No current facility-administered medications for this visit.      PHYSICAL EXAMINATION: ECOG PERFORMANCE STATUS: 0 - Asymptomatic Vitals:   03/15/19 0936  BP: (!) 147/81  Pulse: 72  Resp: 16  Temp: 97.8 F (36.6 C)   Filed Weights   03/15/19 0936  Weight: 180 lb (81.6 kg)    Physical Exam Constitutional:      General: She is not in acute distress. HENT:       Head: Normocephalic and atraumatic.  Eyes:     General: No scleral icterus.    Pupils: Pupils are equal, round, and reactive to light.  Neck:     Musculoskeletal: Normal range of motion and neck supple.  Cardiovascular:     Rate and Rhythm: Normal rate and regular rhythm.     Heart sounds: Normal heart sounds.  Pulmonary:     Effort: Pulmonary effort is normal. No respiratory distress.     Breath sounds:  No wheezing.  Abdominal:     General: Bowel sounds are normal. There is no distension.     Palpations: Abdomen is soft. There is no mass.     Tenderness: There is no abdominal tenderness.  Musculoskeletal: Normal range of motion.        General: No deformity.  Skin:    General: Skin is warm and dry.     Findings: No erythema or rash.  Neurological:     Mental Status: She is alert and oriented to person, place, and time.     Cranial Nerves: No cranial nerve deficit.     Coordination: Coordination normal.  Psychiatric:        Behavior: Behavior normal.        Thought Content: Thought content normal.   Left breast status post biopsy.   LABORATORY DATA:  I have reviewed the data as listed Lab Results  Component Value Date   WBC 6.4 02/25/2019   HGB 15.2 02/25/2019   HCT 43.8 02/25/2019   MCV 88 02/25/2019   PLT 230 02/25/2019   Recent Labs    02/25/19 0858  NA 142  K 4.2  CL 103  CO2 22  GLUCOSE 105*  BUN 16  CREATININE 0.92  CALCIUM 9.6  GFRNONAA 69  GFRAA 80  PROT 7.5  ALBUMIN 4.6  AST 31  ALT 49*  ALKPHOS 54  BILITOT 0.4   Iron/TIBC/Ferritin/ %Sat No results found for: IRON, TIBC, FERRITIN, IRONPCTSAT    RADIOGRAPHIC STUDIES: I have personally reviewed the radiological images as listed and agreed with the findings in the report.  US Breast Ltd Uni Left Inc Axilla  Result Date: 03/06/2019 CLINICAL DATA:  Recall from screening mammogram for architectural distortion in the left breast and a mass in the right breast. EXAM: DIGITAL DIAGNOSTIC LEFT  MAMMOGRAM WITH TOMO ULTRASOUND BILATERAL BREAST COMPARISON:  Previous exam(s). ACR Breast Density Category b: There are scattered areas of fibroglandular density. FINDINGS: Additional views including spot compression demonstrate architectural distortion in the upper outer left breast at middle depth. Targeted bilateral ultrasound was performed. In the left breast at 1 o'clock 2 cm from the nipple an irregular hypoechoic mass measures 0.6 x 0.4 by 0.7 cm. This corresponds to the architectural distortion seen on the mammogram. In the right breast at 9 o'clock 2 cm from the nipple a benign simple cyst measuring 0.9 cm is seen. This corresponds to the mass seen on screening mammogram. IMPRESSION: 1. Irregular hypoechoic mass in the left breast is suspicious for malignancy. 2. Benign cyst in the right breast. No evidence of malignancy in the right breast. RECOMMENDATION: Recommend ultrasound-guided biopsy of the left breast mass. I have discussed the findings and recommendations with the patient. If applicable, a reminder letter will be sent to the patient regarding the next appointment. BI-RADS CATEGORY  4: Suspicious. Electronically Signed   By: Zerita Boers M.D.   On: 03/06/2019 15:35   US Breast Ltd Uni Right Inc Axilla  Result Date: 03/06/2019 CLINICAL DATA:  Recall from screening mammogram for architectural distortion in the left breast and a mass in the right breast. EXAM: DIGITAL DIAGNOSTIC LEFT MAMMOGRAM WITH TOMO ULTRASOUND BILATERAL BREAST COMPARISON:  Previous exam(s). ACR Breast Density Category b: There are scattered areas of fibroglandular density. FINDINGS: Additional views including spot compression demonstrate architectural distortion in the upper outer left breast at middle depth. Targeted bilateral ultrasound was performed. In the left breast at 1 o'clock 2 cm from the nipple an irregular hypoechoic  mass measures 0.6 x 0.4 by 0.7 cm. This corresponds to the architectural distortion seen on the  mammogram. In the right breast at 9 o'clock 2 cm from the nipple a benign simple cyst measuring 0.9 cm is seen. This corresponds to the mass seen on screening mammogram. IMPRESSION: 1. Irregular hypoechoic mass in the left breast is suspicious for malignancy. 2. Benign cyst in the right breast. No evidence of malignancy in the right breast. RECOMMENDATION: Recommend ultrasound-guided biopsy of the left breast mass. I have discussed the findings and recommendations with the patient. If applicable, a reminder letter will be sent to the patient regarding the next appointment. BI-RADS CATEGORY  4: Suspicious. Electronically Signed   By: Zerita Boers M.D.   On: 03/06/2019 15:35   Mm Diag Breast Tomo Uni Left  Result Date: 03/06/2019 CLINICAL DATA:  Recall from screening mammogram for architectural distortion in the left breast and a mass in the right breast. EXAM: DIGITAL DIAGNOSTIC LEFT MAMMOGRAM WITH TOMO ULTRASOUND BILATERAL BREAST COMPARISON:  Previous exam(s). ACR Breast Density Category b: There are scattered areas of fibroglandular density. FINDINGS: Additional views including spot compression demonstrate architectural distortion in the upper outer left breast at middle depth. Targeted bilateral ultrasound was performed. In the left breast at 1 o'clock 2 cm from the nipple an irregular hypoechoic mass measures 0.6 x 0.4 by 0.7 cm. This corresponds to the architectural distortion seen on the mammogram. In the right breast at 9 o'clock 2 cm from the nipple a benign simple cyst measuring 0.9 cm is seen. This corresponds to the mass seen on screening mammogram. IMPRESSION: 1. Irregular hypoechoic mass in the left breast is suspicious for malignancy. 2. Benign cyst in the right breast. No evidence of malignancy in the right breast. RECOMMENDATION: Recommend ultrasound-guided biopsy of the left breast mass. I have discussed the findings and recommendations with the patient. If applicable, a reminder letter will be  sent to the patient regarding the next appointment. BI-RADS CATEGORY  4: Suspicious. Electronically Signed   By: Zerita Boers M.D.   On: 03/06/2019 15:35   Mm 3d Screen Breast Bilateral  Result Date: 02/22/2019 CLINICAL DATA:  Screening. EXAM: DIGITAL SCREENING BILATERAL MAMMOGRAM WITH TOMO AND CAD COMPARISON:  Previous exam(s). ACR Breast Density Category b: There are scattered areas of fibroglandular density. FINDINGS: In the right breast a possible mass requires further evaluation. In the left breast possible distortion requires further evaluation. Images were processed with CAD. IMPRESSION: Further evaluation is suggested for a possible mass in the right breast. Further evaluation is suggested for possible distortion in the left breast. RECOMMENDATION: Diagnostic mammogram and possibly ultrasound of the LEFT breast and ultrasound of the RIGHT breast. (Code:[id number]) The patient will be contacted regarding the findings, and additional imaging will be scheduled. BI-RADS CATEGORY  0: Incomplete. Need additional imaging evaluation and/or prior mammograms for comparison. Electronically Signed   By: Evangeline Dakin M.D.   On: 02/22/2019 16:19   Mm Clip Placement Left  Result Date: 03/15/2019 CLINICAL DATA:  Status post ultrasound-guided core needle biopsy a recently demonstrated 7 mm mass in an area of architectural distortion in the 1 o'clock position of the left breast. EXAM: DIAGNOSTIC LEFT MAMMOGRAM POST ULTRASOUND BIOPSY COMPARISON:  Previous exam(s). FINDINGS: Mammographic images were obtained following ultrasound guided biopsy of the recently demonstrated 7 mm mass in an area of architectural distortion in the 1 o'clock position of the left breast. The Venus shaped biopsy marking clip is in expected position at the site  of biopsy. This is within the area of architectural distortion. IMPRESSION: Appropriate positioning of the Venus shaped biopsy marking clip at the site of biopsy in the the 1 o'clock  position of the left breast. Final Assessment: Post Procedure Mammograms for Marker Placement Electronically Signed   By: Claudie Revering M.D.   On: 03/15/2019 08:57   [name] Lt Breast Bx W Loc Dev 1st Lesion Img Bx Spec US Guide  Result Date: 03/15/2019 CLINICAL DATA:  7 mm mass with architectural distortion in the 1 o'clock position of the left breast at recent mammography and ultrasound. EXAM: ULTRASOUND GUIDED LEFT BREAST CORE NEEDLE BIOPSY COMPARISON:  Previous exam(s). FINDINGS: I met with the patient and we discussed the procedure of ultrasound-guided biopsy, including benefits and alternatives. We discussed the high likelihood of a successful procedure. We discussed the risks of the procedure, including infection, bleeding, tissue injury, clip migration, and inadequate sampling. Informed written consent was given. The usual time-out protocol was performed immediately prior to the procedure. Lesion quadrant: Upper outer quadrant Using sterile technique and 1% Lidocaine as local anesthetic, under direct ultrasound visualization, a 12 gauge spring-loaded device was used to perform biopsy of the recently demonstrated 7 mm mass with architectural distortion in the 1 o'clock position of the left breast using a inferolateral approach. At the conclusion of the procedure Venus shaped tissue marker clip was deployed into the biopsy cavity. Follow up 2 view mammogram was performed and dictated separately. IMPRESSION: Ultrasound guided biopsy of the recently demonstrated 7 mm mass with architectural distortion in the 1 o'clock position of the breast. No apparent complications. Electronically Signed   By: Claudie Revering M.D.   On: 03/15/2019 08:42     ASSESSMENT & PLAN:  1. IgG monoclonal protein disorder   2. Breast mass, left   Labs are reviewed and discussed with patient. Patient has IgG kappa monoclonal protein on SPEP.  Consistent with MGUS. I discussed with patient about the diagnosis of IgG MGUS which is an  asymptomatic condition which has a small risk of progression to malignant plasma cell dyscrasia or lymphoproliferative disorder.   Check UPEP with immunofixation, light chain ratio Obtain skeletal survery Breast mass, status post biopsy.  Awaiting pathology results.  Orders Placed This Encounter  Procedures   DG Bone Survey Met    Standing Status:   Future    Number of Occurrences:   1    Standing Expiration Date:   05/12/2020    Order Specific Question:   Reason for Exam (SYMPTOM  OR DIAGNOSIS REQUIRED)    Answer:   MGUS    Order Specific Question:   Preferred imaging location?    Answer:   [facility]    Order Specific Question:   Is the patient pregnant?    Answer:   No    All questions were answered. The patient knows to call the clinic with any problems questions or concerns.  Cc Melvenia Beam, MD  Return of visit: 2 weeks Thank you for this kind referral and the opportunity to participate in the care of this patient. A copy of today's note is routed to referring provider  Total face to face encounter time for this patient visit was 45 min. >50% of the time was  spent in counseling and coordination of care.    Earlie Server, MD, PhD 03/15/2019

## 2019-03-18 LAB — SURGICAL PATHOLOGY

## 2019-03-19 ENCOUNTER — Telehealth: Payer: Self-pay | Admitting: Obstetrics & Gynecology

## 2019-03-19 NOTE — Telephone Encounter (Signed)
Pt called returning your phone call  

## 2019-03-20 ENCOUNTER — Encounter: Payer: Self-pay | Admitting: Oncology

## 2019-04-04 NOTE — Progress Notes (Signed)
Patient is coming in for follow up she is doing well no complaints. Would like to know her test results

## 2019-04-05 ENCOUNTER — Other Ambulatory Visit: Payer: Self-pay

## 2019-04-05 ENCOUNTER — Inpatient Hospital Stay: Payer: 59 | Admitting: Oncology

## 2019-04-05 ENCOUNTER — Encounter: Payer: Self-pay | Admitting: Oncology

## 2019-04-05 VITALS — BP 130/82 | HR 61 | Temp 97.3°F | Resp 16 | Wt 177.8 lb

## 2019-04-05 DIAGNOSIS — D472 Monoclonal gammopathy: Secondary | ICD-10-CM | POA: Diagnosis not present

## 2019-04-05 DIAGNOSIS — N6489 Other specified disorders of breast: Secondary | ICD-10-CM

## 2019-04-05 DIAGNOSIS — R7989 Other specified abnormal findings of blood chemistry: Secondary | ICD-10-CM | POA: Diagnosis not present

## 2019-04-05 NOTE — Progress Notes (Signed)
Patient does not offer any problems today.  

## 2019-04-07 NOTE — Progress Notes (Signed)
Hematology/Oncology follow up  note [facility] [phone] [phone]   Patient Care Team: [name], MD as PCP - General (Obstetrics and Gynecology) Ammie Dalton, Okey Regal, MD (Unknown Physician Specialty) Bary Castilla Forest Gleason, MD (General Surgery)  REFERRING PROVIDER: [name], MD  CHIEF COMPLAINTS/REASON FOR VISIT:  Evaluation of abnormal SPEP  HISTORY OF PRESENTING ILLNESS:  Andrea Jones is a 58 y.o. female who was seen in consultation at the request of [name], MD for evaluation of abnormal SPEP results.   Patient recently had neurology work-up for 12 numbness and tingling.. Labs reviewed,  SPEP showed 0.9 g/dL M spike,   , and IFE showed IgG Kappa monoclonal protein.  No aggravating or alleviated factors.  Associated signs or symptoms:  Neuropathy: Toe numbness and tingling. Denies weight loss, fever chills, bone pain, Bone pain: Denies Anemia denies  Patient also recently had an abnormal screening mammogram on 02/22/2019 and the patient was called back to perform unilateral left diagnostic breast mammogram on 03/06/2019 Showed irregular hypoechoic mass 0.6 x 0.4 x 0.7 cm in the left breast is suspicious for malignancy.  Axillary lymph node status was not commented on mammogram or targeted ultrasound. Patient is status post left breast mass biopsy   INTERVAL HISTORY Andrea Jones is a 58 y.o. female who has above history reviewed by me today presents for follow up visit for management of abnormal SPEP Problems and complaints are listed below: Patient had work-up done recently present to discuss work-up results. She also underwent left breast mass biopsy. Denies any new complaint today.  Review of Systems  Constitutional: Negative for appetite change, chills, fatigue and fever.  HENT:   Negative for hearing loss and voice change.   Eyes: Negative for eye problems.  Respiratory: Negative for  chest tightness and cough.   Cardiovascular: Negative for chest pain.  Gastrointestinal: Negative for abdominal distention, abdominal pain and blood in stool.  Endocrine: Negative for hot flashes.  Genitourinary: Negative for difficulty urinating and frequency.   Musculoskeletal: Negative for arthralgias.  Skin: Negative for itching and rash.  Neurological: Negative for extremity weakness.  Hematological: Negative for adenopathy.  Psychiatric/Behavioral: Negative for confusion.     MEDICAL HISTORY:  Past Medical History:  Diagnosis Date   Family history of ovarian cancer    8/19 genetic testing letter sent   Heart murmur    hx of per pt, "hasn't presented itself in years"   Hot flashes    "pretty much gone"   Hypercholesteremia    Hyperlipidemia    Insomnia    Vitamin D deficiency 2016    SURGICAL HISTORY: Past Surgical History:  Procedure Laterality Date   ABDOMINAL HYSTERECTOMY  2001   APPENDECTOMY  1982   BREAST BIOPSY Left 1999   cyst,benign   BREAST BIOPSY Left 03/15/2019   [name] bx path pending venus clip   [place]   COLONOSCOPY  06/13/2012   [facility]. Normal Exam.   HYSTERECTOMY ABDOMINAL WITH SALPINGECTOMY     OOPHORECTOMY     OVARY SURGERY  2000    SOCIAL HISTORY: Social History   Socioeconomic History   Marital status: Married    Spouse name: Not on file   Number of children: Not on file   Years of education: Not on file   Highest education level: Not on file  Occupational History   Not on file  Social Needs   Financial resource strain: Not on file  Food insecurity    Worry: Not on file    Inability: Not on file   Transportation needs    Medical: Not on file    Non-medical: Not on file  Tobacco Use   Smoking status: Never Smoker   Smokeless tobacco: Never Used  Substance and Sexual Activity   Alcohol use: No    Alcohol/week: 0.0 standard drinks   Drug use: No   Sexual activity: Not Currently    Lifestyle   Physical activity    Days per week: Not on file    Minutes per session: Not on file   Stress: Not on file  Relationships   Social connections    Talks on phone: Not on file    Gets together: Not on file    Attends religious service: Not on file    Active member of club or organization: Not on file    Attends meetings of clubs or organizations: Not on file    Relationship status: Not on file   Intimate partner violence    Fear of current or ex partner: Not on file    Emotionally abused: Not on file    Physically abused: Not on file    Forced sexual activity: Not on file  Other Topics Concern   Not on file  Social History Narrative   Lives at home with husband    Right handed    FAMILY HISTORY: Family History  Problem Relation Age of Onset   Colon cancer Father 46   Heart disease Father    Congestive Heart Failure Mother    Emphysema Mother    Hypertension Mother    Kidney failure Mother    Neuropathy Mother    Ovarian cancer Sister 46       pat 1/2 sister   Breast cancer Cousin 26    ALLERGIES:  has No Known Allergies.  MEDICATIONS:  Current Outpatient Medications  Medication Sig Dispense Refill   Biotin 10000 MCG TABS Take 1 each by mouth daily.     CALCIUM PO Take 500 mg by mouth daily.     Cholecalciferol (VITAMIN D3 PO) Take 1,000 Int'l Units by mouth daily.     Chromium Picolinate 400 MCG TABS Take by mouth daily.     FIBER PO Take by mouth. Dietary fiber- 6 grams soluble fiber- 6 grams Chromium Picolinate- 200 mcg     Omega-3 Fatty Acids (OMEGA 3 PO) Take 1,200 mg by mouth daily.     OVER THE COUNTER MEDICATION daily. IMMUNE PLUS: Vitamin C- 180 mg  Vitamin D- 25 mcg Zinc- 5 mg     SUPER B COMPLEX/C PO Take by mouth daily. Vitamin C- 150 mg  Thiamine- 100 mg  Riboflavin- 20 mg  Niacin- 25 mg  B6- 2 mg  Folate- 665 mcg B12- 15 mcg Biotin- 30 mcg Pantothenic Acid- 5.5 mg  Calcium- 35 mg     No current  facility-administered medications for this visit.      PHYSICAL EXAMINATION: ECOG PERFORMANCE STATUS: 0 - Asymptomatic Vitals:   04/05/19 1030  BP: 130/82  Pulse: 61  Resp: 16  Temp: (!) 97.3 F (36.3 C)   Filed Weights   04/05/19 1030  Weight: 177 lb 12.8 oz (80.6 kg)    Physical Exam Constitutional:      General: She is not in acute distress. HENT:     Head: Normocephalic and atraumatic.  Eyes:     General: No scleral icterus.    Pupils: Pupils  are equal, round, and reactive to light.  Neck:     Musculoskeletal: Normal range of motion and neck supple.  Cardiovascular:     Rate and Rhythm: Normal rate and regular rhythm.     Heart sounds: Normal heart sounds.  Pulmonary:     Effort: Pulmonary effort is normal. No respiratory distress.     Breath sounds: No wheezing.  Abdominal:     General: Bowel sounds are normal. There is no distension.     Palpations: Abdomen is soft. There is no mass.     Tenderness: There is no abdominal tenderness.  Musculoskeletal: Normal range of motion.        General: No deformity.  Skin:    General: Skin is warm and dry.     Findings: No erythema or rash.  Neurological:     Mental Status: She is alert and oriented to person, place, and time.     Cranial Nerves: No cranial nerve deficit.     Coordination: Coordination normal.  Psychiatric:        Behavior: Behavior normal.        Thought Content: Thought content normal.   Left breast status post biopsy.   LABORATORY DATA:  I have reviewed the data as listed Lab Results  Component Value Date   WBC 6.4 02/25/2019   HGB 15.2 02/25/2019   HCT 43.8 02/25/2019   MCV 88 02/25/2019   PLT 230 02/25/2019   Recent Labs    02/25/19 0858  NA 142  K 4.2  CL 103  CO2 22  GLUCOSE 105*  BUN 16  CREATININE 0.92  CALCIUM 9.6  GFRNONAA 69  GFRAA 80  PROT 7.5  ALBUMIN 4.6  AST 31  ALT 49*  ALKPHOS 54  BILITOT 0.4   Iron/TIBC/Ferritin/ %Sat No results found for: IRON, TIBC,  FERRITIN, IRONPCTSAT    RADIOGRAPHIC STUDIES: I have personally reviewed the radiological images as listed and agreed with the findings in the report.  Dg Bone Survey Met  Result Date: 03/15/2019 CLINICAL DATA:  IgG monoclonal protein disorder. MGUS. No current complaints. EXAM: METASTATIC BONE SURVEY COMPARISON:  Limited correlation made with chest radiographs 09/17/2004. FINDINGS: Lateral view of the skull demonstrates no lytic calvarial lesions. There are no focal lytic lesions or pathologic fractures in the spine. There is mild cervical spondylosis. There are scattered Schmorl's nodes within the thoracolumbar spine. No rib or pelvic lytic lesions identified. The heart size and mediastinal contours are stable. The lungs are clear. There is no pleural effusion or pneumothorax. No lytic lesions or pathologic fractures are seen within the extremities. IMPRESSION: No lytic lesions are identified within axial or appendicular skeleton to suggest multiple myeloma. No evidence of pathologic fracture. Mild spondylosis. Electronically Signed   By: Richardean Sale M.D.   On: 03/15/2019 16:17   Mm Clip Placement Left  Result Date: 03/15/2019 CLINICAL DATA:  Status post ultrasound-guided core needle biopsy a recently demonstrated 7 mm mass in an area of architectural distortion in the 1 o'clock position of the left breast. EXAM: DIAGNOSTIC LEFT MAMMOGRAM POST ULTRASOUND BIOPSY COMPARISON:  Previous exam(s). FINDINGS: Mammographic images were obtained following ultrasound guided biopsy of the recently demonstrated 7 mm mass in an area of architectural distortion in the 1 o'clock position of the left breast. The Venus shaped biopsy marking clip is in expected position at the site of biopsy. This is within the area of architectural distortion. IMPRESSION: Appropriate positioning of the Venus shaped biopsy marking clip at the  site of biopsy in the the 1 o'clock position of the left breast. Final Assessment: Post  Procedure Mammograms for Marker Placement Electronically Signed   By: Claudie Revering M.D.   On: 03/15/2019 08:57   [name] Lt Breast Bx W Loc Dev 1st Lesion Img Bx Spec US Guide  Addendum Date: 03/19/2019   ADDENDUM REPORT: 03/19/2019 09:27 ADDENDUM: PATHOLOGY revealed: A. LEFT BREAST; ULTRASOUND-GUIDED NEEDLE CORE BIOPSY: - RADIAL SCAR / COMPLEX SCLEROSING LESION. - NEGATIVE FOR ATYPIA AND MALIGNANCY. Comment: Immunohistochemical stains for p63 and calponin highlight myoepithelial cells. Pathology results are CONCORDANT with imaging findings, per Dr. Claudie Revering. Pathology results were discussed with patient via telephone. The patient reported doing well after the biopsy with no adverse symptoms, only tenderness at the site. Post biopsy care instructions were reviewed and questions were answered. The patient was encouraged to call [facility] for any additional questions or concerns. Recommendation: Surgical referral for excision to exclude associated malignancy. Request for surgical referral was relayed to [facility], at [facility], by Electa Sniff RN on 03/19/2019. Addendum by Electa Sniff RN on 03/19/2019. Electronically Signed   By: Claudie Revering M.D.   On: 03/19/2019 09:27   Result Date: 03/19/2019 CLINICAL DATA:  7 mm mass with architectural distortion in the 1 o'clock position of the left breast at recent mammography and ultrasound. EXAM: ULTRASOUND GUIDED LEFT BREAST CORE NEEDLE BIOPSY COMPARISON:  Previous exam(s). FINDINGS: I met with the patient and we discussed the procedure of ultrasound-guided biopsy, including benefits and alternatives. We discussed the high likelihood of a successful procedure. We discussed the risks of the procedure, including infection, bleeding, tissue injury, clip migration, and inadequate sampling. Informed written consent was given. The usual time-out protocol was performed immediately prior to the procedure. Lesion quadrant: Upper outer quadrant  Using sterile technique and 1% Lidocaine as local anesthetic, under direct ultrasound visualization, a 12 gauge spring-loaded device was used to perform biopsy of the recently demonstrated 7 mm mass with architectural distortion in the 1 o'clock position of the left breast using a inferolateral approach. At the conclusion of the procedure Venus shaped tissue marker clip was deployed into the biopsy cavity. Follow up 2 view mammogram was performed and dictated separately. IMPRESSION: Ultrasound guided biopsy of the recently demonstrated 7 mm mass with architectural distortion in the 1 o'clock position of the breast. No apparent complications. Electronically Signed: By: Claudie Revering M.D. On: 03/15/2019 08:42     ASSESSMENT & PLAN:  1. MGUS (monoclonal gammopathy of unknown significance)   2. Radial scar of breast   Labs reviewed and discussed with patient. Urine protein electrophoresis with immunofixation showed no monoclonal protein. Free light chain ratio 2.5.  Free kappa 27.7, free lambda 11.1. 03/15/2019 bone skeletal survey showed negative for lytic lesions.  IgG MGUS, which is asymptomatic condition which is a small risk of progression to malignant plasma dyscrasia or lymphoproliferative disease.  Recommend observation.  #Radial scar/complex sclerosing lesion, discussed with patient that this is a benign lesion, recommend excision. Patient has an appointment to discuss with surgery next week.   Orders Placed This Encounter  Procedures   CBC with Differential/Platelet    Standing Status:   Future    Standing Expiration Date:   04/04/2020   Comprehensive metabolic panel    Standing Status:   Future    Standing Expiration Date:   04/04/2020   Multiple Myeloma Panel (SPEP&IFE w/QIG)    Standing Status:   Future  Standing Expiration Date:   04/04/2020   Kappa/lambda light chains    Standing Status:   Future    Standing Expiration Date:   04/04/2020    All questions were answered.  The patient knows to call the clinic with any problems questions or concerns.  Cc [name], MD  Return of visit: 6 months Earlie Server, MD, PhD 04/07/2019

## 2019-04-22 ENCOUNTER — Ambulatory Visit: Payer: 59 | Admitting: Neurology

## 2019-04-22 ENCOUNTER — Other Ambulatory Visit: Payer: Self-pay

## 2019-04-22 ENCOUNTER — Encounter: Payer: Self-pay | Admitting: Neurology

## 2019-04-22 VITALS — BP 144/84 | HR 73 | Temp 97.6°F | Ht 62.0 in | Wt 182.0 lb

## 2019-04-22 DIAGNOSIS — G629 Polyneuropathy, unspecified: Secondary | ICD-10-CM

## 2019-04-22 NOTE — Progress Notes (Signed)
[facility]    Provider:  Dr Jaynee Eagles Requesting Provider: No ref. provider found Primary Care Provider:  Teresa Coombs MD  CC:  Sensory symptoms in the great toes  Interval history: Not worsening in the feet. Right now the toes are not numb. More pain then numbness. Right now her feet are not symptomatic. Recommend podiatry, may be plantar fasciitis. She does not notice it in the mornings, notices it sporadically during the day, moreso in the evening. She stopped the B complex due to elevated b6 and now starting a multi-vitamin.   HPI:  Andrea Jones is a 58 y.o. female here as requested by No ref. provider found for peripheral neuropathy. PMHx heart murmur, hot flashes, hypercholesterolemia/lipidemia, insomnia, vit d def, abnormal PAP smears and cervical dysplasia. Mother had peripheral neuropathy, passed away at 77, she had significant pain in her feet, she did not have diabetes, mother ended up in a wheelchair and she could not walk without her shoes, she had a lot of numbness on her feet and had a problem wit balance, she had a lot of burning and sensitivity in the feet to touch. Patient has started getting symptoms in her 2 big toes, numbness, pain, no burning but it has scared her, currently no symptoms "comes and goes", not worse with sitting, standing walking, unclear when she feels it and she can't tell me if anything makes it worse or better, she exercised too which doesn't seem to exacerbate it. Mostly on the sides of the great toes bilaterally, she does not wear tight shows or heels, The bottoms of her mother's feet were flat, her toes were a little crooked, she had a problem with her toe nails never healthy, radiated up into the legs, mother was on gabapentin, she doesn't know if her mother was ever diagnosed but she was taking B12 shots. Patient's toes started being symptomatic for a few months, she started taking supplements, she started the B vitamins after the  symptoms, she started the immune health in March a few months before symptoms. She has some pain in her thumbs, no numbness or sensory changes she thinking it may be arthritis or ovreruse unclear but no sensory symptoms. No other focal neurologic deficits, associated symptoms, inciting events or modifiable factors.  Reviewed notes, labs and imaging from outside physicians, which showed:  I reviewed multiple notes from Dr. Kenton Kingfisher from 2019-2020.  Reports over the last year bilateral numbness and tingling of the big toes that is intermittent and not associated with anything.  Normal testing for diabetes yearly.  Family history of peripheral neuropathy in the mother so patient is concerned.  Normal diabetic testing per Dr. Kenton Kingfisher' notes but I don't see any labs in [facility].  Never smoked.  No alcohol use or drug use. Review of symptoms positive for tingling neurologically otherwise negative.  She had labs about amonth ago, will request  Review of Systems: Patient complains of symptoms per HPI as well as the following symptoms: sensory changes in the great toes. Pertinent negatives and positives per HPI. All others negative.   Social History   Socioeconomic History   Marital status: Married    Spouse name: Not on file   Number of children: Not on file   Years of education: Not on file   Highest education level: Not on file  Occupational History   Not on file  Social Needs   Financial resource strain: Not on file   Food insecurity    Worry: Not  on file    Inability: Not on file   Transportation needs    Medical: Not on file    Non-medical: Not on file  Tobacco Use   Smoking status: Never Smoker   Smokeless tobacco: Never Used  Substance and Sexual Activity   Alcohol use: No    Alcohol/week: 0.0 standard drinks   Drug use: No   Sexual activity: Not Currently  Lifestyle   Physical activity    Days per week: Not on file    Minutes per session: Not on file   Stress: Not on  file  Relationships   Social connections    Talks on phone: Not on file    Gets together: Not on file    Attends religious service: Not on file    Active member of club or organization: Not on file    Attends meetings of clubs or organizations: Not on file    Relationship status: Not on file   Intimate partner violence    Fear of current or ex partner: Not on file    Emotionally abused: Not on file    Physically abused: Not on file    Forced sexual activity: Not on file  Other Topics Concern   Not on file  Social History Narrative   Lives at home with husband    Right handed    Family History  Problem Relation Age of Onset   Colon cancer Father 60   Heart disease Father    Congestive Heart Failure Mother    Emphysema Mother    Hypertension Mother    Kidney failure Mother    Neuropathy Mother    Ovarian cancer Sister 75       pat 1/2 sister   Breast cancer Cousin 67    Past Medical History:  Diagnosis Date   Family history of ovarian cancer    8/19 genetic testing letter sent   Heart murmur    hx of per pt, "hasn't presented itself in years"   Hot flashes    "pretty much gone"   Hypercholesteremia    Hyperlipidemia    Insomnia    Vitamin D deficiency 2016    Patient Active Problem List   Diagnosis Date Noted   Small fiber neuropathy 04/22/2019   Atypical squamous cells cannot exclude high grade squamous intraepithelial lesion on cytologic smear of cervix (ASC-H) 01/19/2018   Heme positive stool 08/27/2014    Past Surgical History:  Procedure Laterality Date   ABDOMINAL HYSTERECTOMY  2001   APPENDECTOMY  1982   BREAST BIOPSY Left 1999   cyst,benign   BREAST BIOPSY Left 03/15/2019   [name] bx path pending venus clip   [place]   COLONOSCOPY  06/13/2012   [facility]. Normal Exam.   HYSTERECTOMY ABDOMINAL WITH SALPINGECTOMY     OOPHORECTOMY     OVARY SURGERY  2000    Current Outpatient Medications  Medication  Sig Dispense Refill   Biotin 10000 MCG TABS Take 1 each by mouth daily.     CALCIUM PO Take 500 mg by mouth daily.     Cholecalciferol (VITAMIN D3 PO) Take 1,000 Int'l Units by mouth daily.     Chromium Picolinate 400 MCG TABS Take by mouth daily.     FIBER PO Take by mouth. Dietary fiber- 6 grams soluble fiber- 6 grams Chromium Picolinate- 200 mcg     Omega-3 Fatty Acids (OMEGA 3 PO) Take 1,200 mg by mouth daily.     OVER  THE COUNTER MEDICATION daily. IMMUNE PLUS: Vitamin C- 180 mg  Vitamin D- 25 mcg Zinc- 5 mg     No current facility-administered medications for this visit.     Allergies as of 04/22/2019   (No Known Allergies)    Vitals: BP (!) 144/84 (BP Location: Right Arm, Patient Position: Sitting)    Pulse 73    Temp 97.6 F (36.4 C) Comment: taken at front door   Ht 5\' 2"  (1.575 m)    Wt 182 lb (82.6 kg)    BMI 33.29 kg/m  Last Weight:  Wt Readings from Last 1 Encounters:  04/22/19 182 lb (82.6 kg)   Last Height:   Ht Readings from Last 1 Encounters:  04/22/19 5\' 2"  (1.575 m)     Physical exam: Exam: Gen: NAD, conversant, well nourised, obese, well groomed                     CV: RRR, no MRG. No Carotid Bruits. No peripheral edema, warm, nontender Eyes: Conjunctivae clear without exudates or hemorrhage  Neuro: Detailed Neurologic Exam  Speech:    Speech is normal; fluent and spontaneous with normal comprehension.  Cognition:    The patient is oriented to person, place, and time;     recent and remote memory intact;     language fluent;     normal attention, concentration,     fund of knowledge Cranial Nerves:    The pupils are equal, round, and reactive to light. The fundi are flat. Visual fields are full to finger confrontation. Extraocular movements are intact. Trigeminal sensation is intact and the muscles of mastication are normal. The face is symmetric. The palate elevates in the midline. Hearing intact. Voice is normal. Shoulder shrug is  normal. The tongue has normal motion without fasciculations.   Coordination:    Normal finger to nose and heel to shin. Normal rapid alternating movements.   Gait:    Difficulty getting out of seat, reports significant pain in the right leg, using a walker, slow, cautious, antalgic  Motor Observation:    No asymmetry, no atrophy, and no involuntary movements noted. Tone:    Normal muscle tone.    Posture:    Posture is normal. normal erect    Strength:    Strength is V/V in the upper and lower limbs.      Sensation: intact to LT, pin prick, vibration, temp. Neg Romberg.      Reflex Exam:  DTR's:    Deep tendon reflexes in the upper and lower extremities are normal bilaterally.   Toes:    The toes are downgoing bilaterally.   Clonus:    Clonus is absent.    Assessment/Plan:  31 58 year old with sensory symptoms in the great toes, likely small fiber of unknown etiology. Labs showed +MGUS, elevated b levels, she stopped her B complex. Mild neuropathic small-fiber symptoms in the great toes.   EMG/NCS would be likely negative, it does not catch small-fiber neuropathy, but offered. Decided to follow clinically, not worsening, not progressive. She has localized thumb pain likely arthritic but if we perform emg/ncs can check one hand.   If symptoms worsening, she will email me and we will order an emg/ncs  Mother with severe neuropathy in the feet, consider genetic testing next.   Patient's toes started being symptomatic a few months ago, prior she started taking supplements, (she started the B vitamins after the symptoms), she started the Immune Health in  March a few months before symptoms. She stopped any suplemment started before the symptoms.   Super B complex is fine and started after symptoms appeared however make sure not mega doses of B6 can be toxic and cause neuropathy. (100mg ). She stopped.   Mother had B12 deficiency, otherwise no diabetes and unclear why she had  such severe painful neuropathy in the feet. Consider genetic testing.  Wear good arch supports or see a podiatrist just to rule out any structural foot issues.   Will schedule f/u in 6 months   Cc: Teresa Coombs, MD  Sarina Ill, MD  [facility] [address] [place], [place] [address]  Phone [phone] Fax [phone]  A total of 25 minutes was spent face-to-face with this patient. Over half this time was spent on counseling patient on the  1. Small fiber neuropathy    diagnosis and different diagnostic and therapeutic options, counseling and coordination of care, risks ans benefits of management, compliance, or risk factor reduction and education.

## 2019-07-02 ENCOUNTER — Other Ambulatory Visit: Payer: Self-pay

## 2019-07-02 ENCOUNTER — Ambulatory Visit (INDEPENDENT_AMBULATORY_CARE_PROVIDER_SITE_OTHER): Payer: 59

## 2019-07-02 ENCOUNTER — Encounter: Payer: Self-pay | Admitting: Podiatry

## 2019-07-02 ENCOUNTER — Ambulatory Visit: Payer: 59 | Admitting: Podiatry

## 2019-07-02 ENCOUNTER — Other Ambulatory Visit: Payer: Self-pay | Admitting: Podiatry

## 2019-07-02 VITALS — BP 137/81

## 2019-07-02 DIAGNOSIS — M792 Neuralgia and neuritis, unspecified: Secondary | ICD-10-CM

## 2019-07-02 DIAGNOSIS — G5790 Unspecified mononeuropathy of unspecified lower limb: Secondary | ICD-10-CM | POA: Diagnosis not present

## 2019-07-02 DIAGNOSIS — M79671 Pain in right foot: Secondary | ICD-10-CM

## 2019-07-02 DIAGNOSIS — M2141 Flat foot [pes planus] (acquired), right foot: Secondary | ICD-10-CM

## 2019-07-02 DIAGNOSIS — G629 Polyneuropathy, unspecified: Secondary | ICD-10-CM

## 2019-07-02 DIAGNOSIS — M79672 Pain in left foot: Secondary | ICD-10-CM

## 2019-07-02 DIAGNOSIS — G5782 Other specified mononeuropathies of left lower limb: Secondary | ICD-10-CM | POA: Diagnosis not present

## 2019-07-02 DIAGNOSIS — M2142 Flat foot [pes planus] (acquired), left foot: Secondary | ICD-10-CM

## 2019-07-02 NOTE — Progress Notes (Signed)
Subjective:  Patient ID: Andrea Jones, female    DOB: 1961-02-10,  MRN: [id number]  Chief Complaint  Patient presents with  . Numbness    pt is here for bil numbness of both great toes, which has been going on for about 7 months, pt states that numbness is intermittent and is unsure how it flairs up, pt states that the left foot also has a burning sensation to the lateral side of the left foot, that has been going on for about 3 weeks now, pt also states that she is unsure how the flair ups startes but states that she has stopped walking, which has not helped at all.    59 y.o. female presents with the above complaint.  Patient presents with bilateral numbness in the medial aspect of both great toes.  She states been going on for about 7 months.  It is very sporadic in nature.  She states that she has a family history of neuropathy with unknown etiology.  She does state that it is tingling numbness and burning feeling that happens at the same time.  On the left side she states that there is a burning pain on the lateral side as well that has been going on for about 3 weeks the pain is elevated when overextending it.  Patient does not know how the burning is elevated.  Patient has tried resting it was not helped much.  Pain scale is 5 out of 10.  She denies any other acute complaints. She is denies seeing anyone else for this.  Review of Systems: Negative except as noted in the HPI. Denies N/V/F/Ch.  Past Medical History:  Diagnosis Date  . Family history of ovarian cancer    8/19 genetic testing letter sent  . Heart murmur    hx of per pt, "hasn't presented itself in years"  . Hot flashes    "pretty much gone"  . Hypercholesteremia   . Hyperlipidemia   . Insomnia   . Vitamin D deficiency 2016    Current Outpatient Medications:  .  Biotin 10000 MCG TABS, Take 1 each by mouth daily., Disp: , Rfl:  .  CALCIUM PO, Take 500 mg by mouth daily., Disp: , Rfl:  .  Cholecalciferol  (VITAMIN D3 PO), Take 1,000 Int'l Units by mouth daily., Disp: , Rfl:  .  Chromium Picolinate 400 MCG TABS, Take by mouth daily., Disp: , Rfl:  .  doxycycline (VIBRA-TABS) 100 MG tablet, , Disp: , Rfl:  .  FIBER PO, Take by mouth. Dietary fiber- 6 grams soluble fiber- 6 grams Chromium Picolinate- 200 mcg, Disp: , Rfl:  .  Omega-3 Fatty Acids (OMEGA 3 PO), Take 1,200 mg by mouth daily., Disp: , Rfl:  .  OVER THE COUNTER MEDICATION, daily. IMMUNE PLUS: Vitamin C- 180 mg  Vitamin D- 25 mcg Zinc- 5 mg, Disp: , Rfl:   Social History   Tobacco Use  Smoking Status Never Smoker  Smokeless Tobacco Never Used    No Known Allergies Objective:   Vitals:   07/02/19 0925  BP: 137/81   There is no height or weight on file to calculate BMI. Constitutional Well developed. Well nourished.  Vascular Dorsalis pedis pulses palpable bilaterally. Posterior tibial pulses palpable bilaterally. Capillary refill normal to all digits.  No cyanosis or clubbing noted. Pedal hair growth normal.  Neurologic Normal speech. Oriented to person, place, and time. Decreased sensation to the bilateral medial aspect of the hallux as well as the lateral aspect  of the foot.  Neuritis related symptoms.  Dermatologic Nails well groomed and normal in appearance. No open wounds. No skin lesions.  Orthopedic:  No pain on palpation to the metatarsophalangeal joint bilaterally no pain along the medial aspect of the hallux on palpation.  Manual muscle testing 5 out of 5 bilaterally.   Radiographs: Mild exostosis noted at the distal phalanx of bilateral hallux.  No other bony abnormalities identified.  Os peroneum noted bilaterally.  However her pain is not clinically correlating. Assessment:   1. Bilateral foot pain   2. Small fiber neuropathy   3. Sural neuritis, left   4. Neuritis of foot, unspecified laterality   5. Pes planus of both feet    Plan:  Patient was evaluated and treated and all questions  answered.  Bilateral hallux intermittent neuritis -I explained to the patient the etiology of neuritis and various treatment options associated with it.  Patient states that she does have a history of lower back pain with a possible small fiber neuropathy.  I explained to the patient that she will benefit from shoe gear modification as the medial part of the hallux sensation is controlled by a nerve that runs across the top of the foot which could be irritated from excessive pressure from the dorsal aspect of the shoe gear.  Patient states that she will make the modification and assess if she is doing better or not.  Left peroneal neuritis -I explained to the patient the etiology of neuritis and various treatment options associated with it.  I believe that this is likely due to compensate Tory mechanism by protecting the hallux from pain/tingling and therefore compensating with walking on the outside of her foot therefore aggravating/putting excessive pressure on the several nerve that runs along the course of the lateral foot.  I believe this will resolve on itself once the pain from both hallucis are under control.  Pes planus deformity semiflexible -I explained to the patient the etiology of pes planus deformity and its underlying correlation with her gait that could be the underlying etiology of excessive pressure on top of her shoe foot as well as the lateral aspect of her foot.  I believe patient will benefit from custom-made orthotics to help control the hindfoot motion as well as provide arch support. -Patient is scheduled to see Liliane Channel for custom-made orthotics  Return if symptoms worsen or fail to improve, for See Liliane Channel for orthotics asap.

## 2019-07-03 ENCOUNTER — Encounter: Payer: Self-pay | Admitting: Podiatry

## 2019-07-12 ENCOUNTER — Ambulatory Visit: Payer: 59 | Admitting: Orthotics

## 2019-07-12 ENCOUNTER — Other Ambulatory Visit: Payer: Self-pay

## 2019-07-12 DIAGNOSIS — G5782 Other specified mononeuropathies of left lower limb: Secondary | ICD-10-CM

## 2019-07-12 DIAGNOSIS — M2141 Flat foot [pes planus] (acquired), right foot: Secondary | ICD-10-CM

## 2019-07-12 DIAGNOSIS — M2142 Flat foot [pes planus] (acquired), left foot: Secondary | ICD-10-CM

## 2019-07-12 DIAGNOSIS — G629 Polyneuropathy, unspecified: Secondary | ICD-10-CM

## 2019-07-12 DIAGNOSIS — M79671 Pain in right foot: Secondary | ICD-10-CM

## 2019-07-22 ENCOUNTER — Ambulatory Visit (INDEPENDENT_AMBULATORY_CARE_PROVIDER_SITE_OTHER): Payer: 59 | Admitting: Obstetrics & Gynecology

## 2019-07-22 ENCOUNTER — Encounter: Payer: Self-pay | Admitting: Obstetrics & Gynecology

## 2019-07-22 ENCOUNTER — Other Ambulatory Visit: Payer: Self-pay

## 2019-07-22 ENCOUNTER — Other Ambulatory Visit (HOSPITAL_COMMUNITY)
Admission: RE | Admit: 2019-07-22 | Discharge: 2019-07-22 | Disposition: A | Discharge status: Home / Self Care | Payer: 59 | Source: Ambulatory Visit | Attending: Obstetrics & Gynecology | Admitting: Obstetrics & Gynecology

## 2019-07-22 VITALS — BP 130/88 | Ht 62.5 in | Wt 180.0 lb

## 2019-07-22 DIAGNOSIS — Z90711 Acquired absence of uterus with remaining cervical stump: Secondary | ICD-10-CM | POA: Diagnosis not present

## 2019-07-22 DIAGNOSIS — R87611 Atypical squamous cells cannot exclude high grade squamous intraepithelial lesion on cytologic smear of cervix (ASC-H): Secondary | ICD-10-CM | POA: Diagnosis present

## 2019-07-22 NOTE — Progress Notes (Signed)
HPI:  Patient is a 59 y.o. [id number] presenting for follow up evaluation of abnormal PAP smear in the past.  Her last PAP was 6 months ago and approximate date 01/2019 and was abnormal: LGSIL; prior PAP 1 year ago normal, and prior to that ASC-H. She has had a prior colposcopy. Prior biopsies (if done) were Normal.  Prior LSH.  PMHx: She  has a past medical history of Family history of ovarian cancer, Heart murmur, Hot flashes, Hypercholesteremia, Hyperlipidemia, Insomnia, and Vitamin D deficiency (2016). Also,  has a past surgical history that includes Appendectomy (1982); Abdominal hysterectomy (2001); Ovary surgery (2000); Cesarean section (1985); Colonoscopy (06/13/2012); Hysterectomy abdominal with salpingectomy; Oophorectomy; Breast biopsy (Left, 1999); Breast biopsy (Left, 03/15/2019); and mgus., family history includes Breast cancer (age of onset: 48) in her cousin; Colon cancer (age of onset: 85) in her father; Congestive Heart Failure in her mother; Emphysema in her mother; Heart disease in her father; Hypertension in her mother; Kidney failure in her mother; Neuropathy in her mother; Ovarian cancer (age of onset: 63) in her sister.,  reports that she has never smoked. She has never used smokeless tobacco. She reports that she does not drink alcohol or use drugs.  She has a current medication list which includes the following prescription(s): biotin, calcium, cholecalciferol, chromium picolinate, doxycycline, fiber, omega-3 fatty acids, and OVER THE COUNTER MEDICATION. Also, has No Known Allergies.  Review of Systems  Constitutional: Negative for chills, fever and malaise/fatigue.  HENT: Negative for congestion, sinus pain and sore throat.   Eyes: Negative for blurred vision and pain.  Respiratory: Negative for cough and wheezing.   Cardiovascular: Negative for chest pain and leg swelling.  Gastrointestinal: Negative for abdominal pain, constipation, diarrhea, heartburn, nausea and vomiting.   Genitourinary: Negative for dysuria, frequency, hematuria and urgency.  Musculoskeletal: Negative for back pain, joint pain, myalgias and neck pain.  Skin: Negative for itching and rash.  Neurological: Positive for tingling. Negative for dizziness, tremors and weakness.  Endo/Heme/Allergies: Does not bruise/bleed easily.  Psychiatric/Behavioral: Negative for depression. The patient is not nervous/anxious and does not have insomnia.     Objective: BP 130/88   Ht 5' 2.5" (1.588 m)   Wt 180 lb (81.6 kg)   BMI 32.40 kg/m  Filed Weights   07/22/19 0758  Weight: 180 lb (81.6 kg)   Body mass index is 32.4 kg/m.  Physical examination Physical Exam Constitutional:      General: She is not in acute distress.    Appearance: She is well-developed.  Genitourinary:     Pelvic exam was performed with patient supine.     Vagina normal.     No vaginal erythema or bleeding.     No cervical motion tenderness, discharge, polyp or nabothian cyst.     Uterus is absent.     No right or left adnexal mass present.     Right adnexa not tender.     Left adnexa not tender.  HENT:     Head: Normocephalic and atraumatic.     Nose: Nose normal.  Abdominal:     General: There is no distension.     Palpations: Abdomen is soft.     Tenderness: There is no abdominal tenderness.  Musculoskeletal:        General: Normal range of motion.  Neurological:     Mental Status: She is alert and oriented to person, place, and time.     Cranial Nerves: No cranial nerve deficit.  Skin:  General: Skin is warm and dry.  Psychiatric:        Attention and Perception: Attention normal.        Mood and Affect: Mood and affect normal.        Speech: Speech normal.        Behavior: Behavior normal.        Thought Content: Thought content normal.        Judgment: Judgment normal.    ASSESSMENT:  History of Cervical Dysplasia  Plan:  1.  I discussed the grading system of pap smears and HPV high risk viral  types.   2. Follow up PAP 6 months, vs intervention if high grade dysplasia identified. 3. Treatment of persistantly abnormal PAP smears and cervical dysplasia, even mild, is discussed w pt today in detail, as well as the pros and cons of Cryo and LEEP procedures. Will consider and discuss after results.  A total of 20 minutes were spent face-to-face with the patient as well as preparation, review, communication, and documentation during this encounter.    Barnett Applebaum, MD, [facility], [facility] 07/22/2019  8:18 AM

## 2019-07-23 LAB — CYTOLOGY - PAP

## 2019-07-24 NOTE — Progress Notes (Signed)
Sch Annual in August plz (Aug sch should be up and ready)  D/W Pt.  Still LGSIL, option for surveillance vs cryo/Leep/trachelectomy as options at this and future times.  Plan PAP 6 mos.

## 2019-07-30 ENCOUNTER — Ambulatory Visit: Payer: 59 | Admitting: Orthotics

## 2019-07-30 ENCOUNTER — Other Ambulatory Visit: Payer: Self-pay

## 2019-07-30 DIAGNOSIS — M79671 Pain in right foot: Secondary | ICD-10-CM

## 2019-07-30 DIAGNOSIS — G629 Polyneuropathy, unspecified: Secondary | ICD-10-CM

## 2019-07-30 NOTE — Progress Notes (Signed)
Patient came in today to pick up custom made foot orthotics.  The goals were accomplished and the patient reported no dissatisfaction with said orthotics.  Patient was advised of breakin period and how to report any issues. 

## 2019-09-24 IMAGING — MG MM DIGITAL SCREENING BILAT W/ TOMO W/ CAD
8 series · 8 of 24 positions shown · non-contrast
Comparison: Previous exam(s).

CLINICAL DATA: Screening.

EXAM:
DIGITAL SCREENING BILATERAL MAMMOGRAM WITH TOMO AND CAD

[R MLO synth-2D]
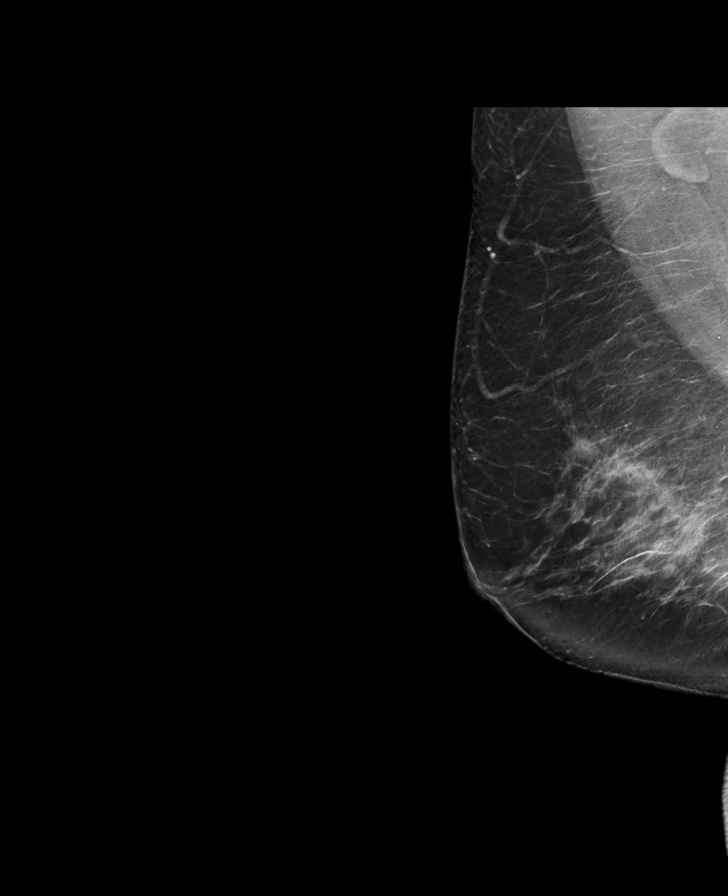

[L MLO synth-2D]
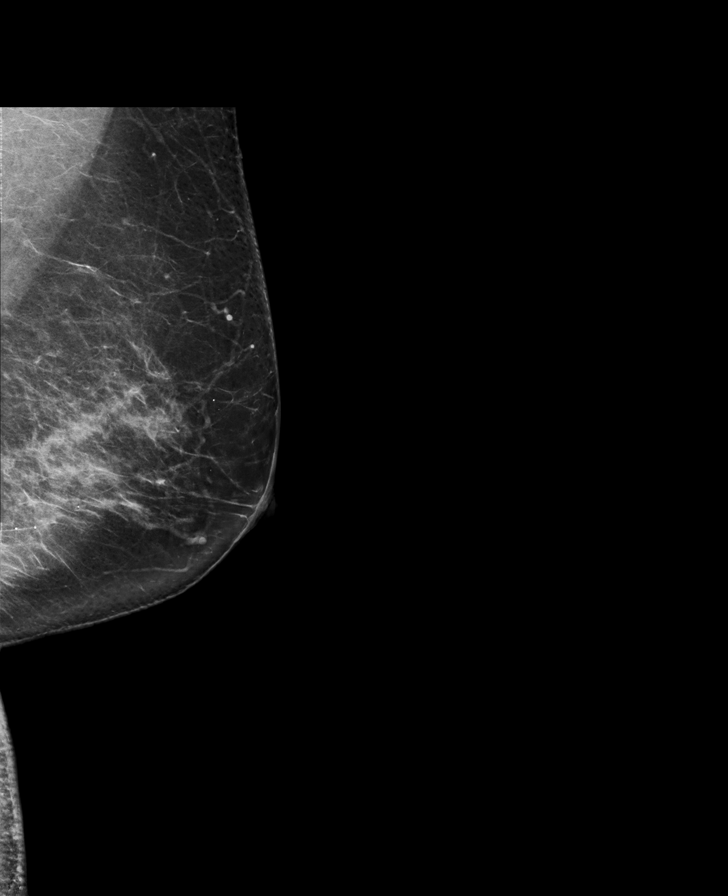

[L CC synth-2D]
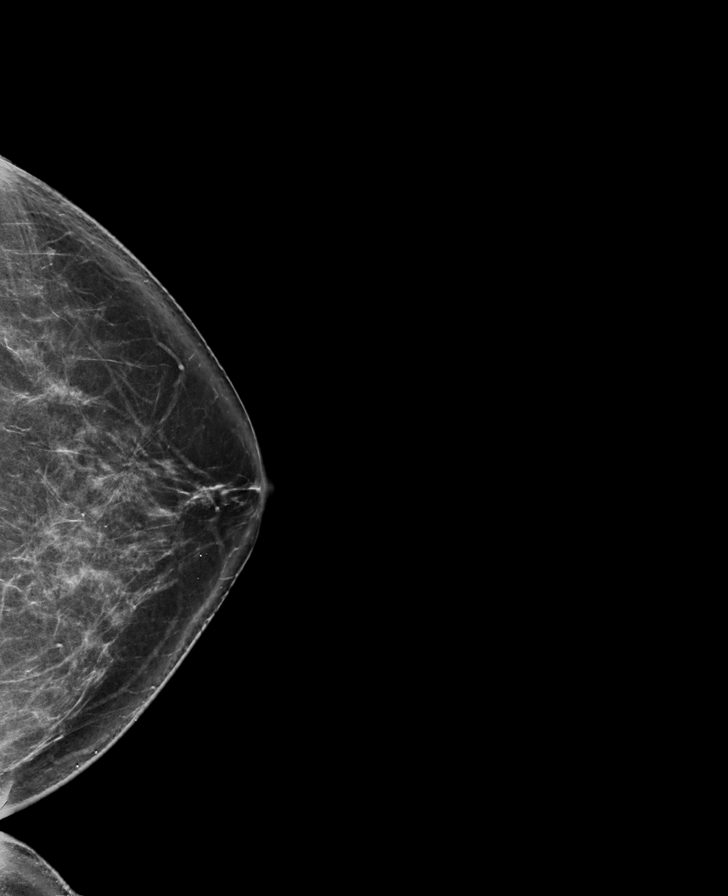

[R CC synth-2D]
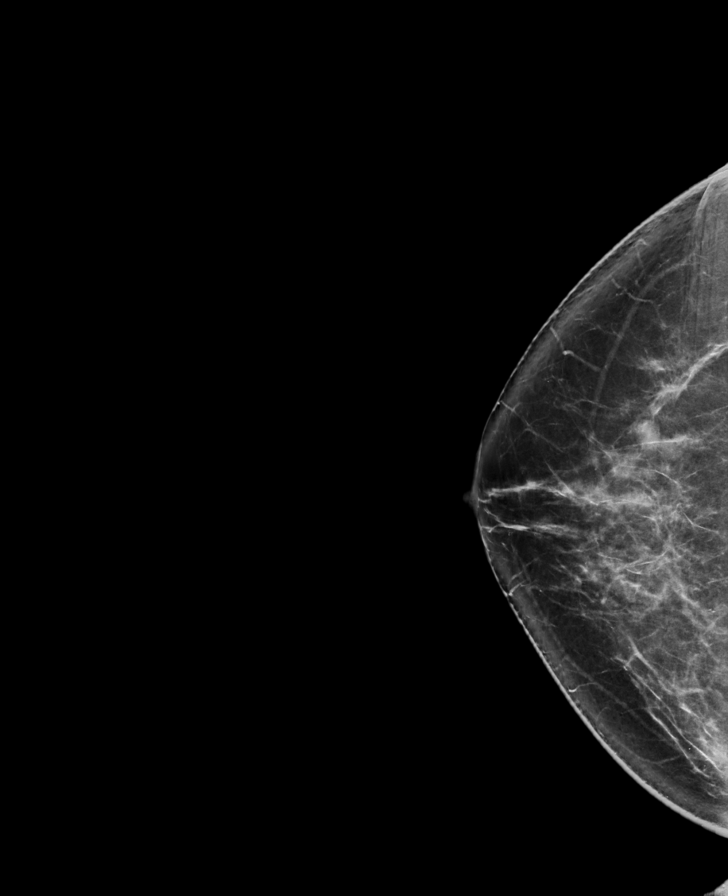

[L MLO tomo · tomo slice 45/90.0]
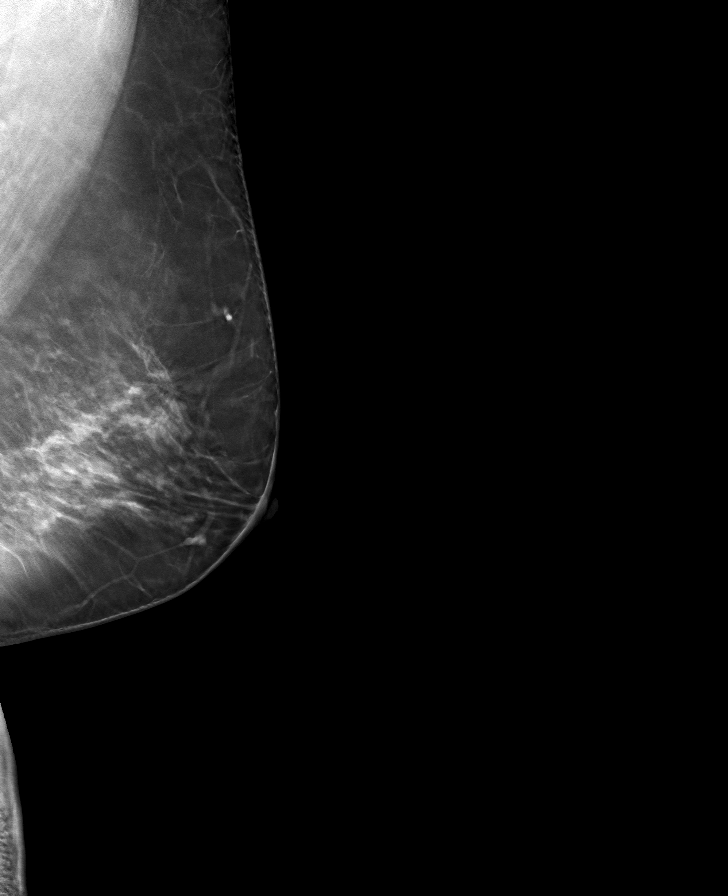

[R CC tomo · tomo slice 43/84.0]
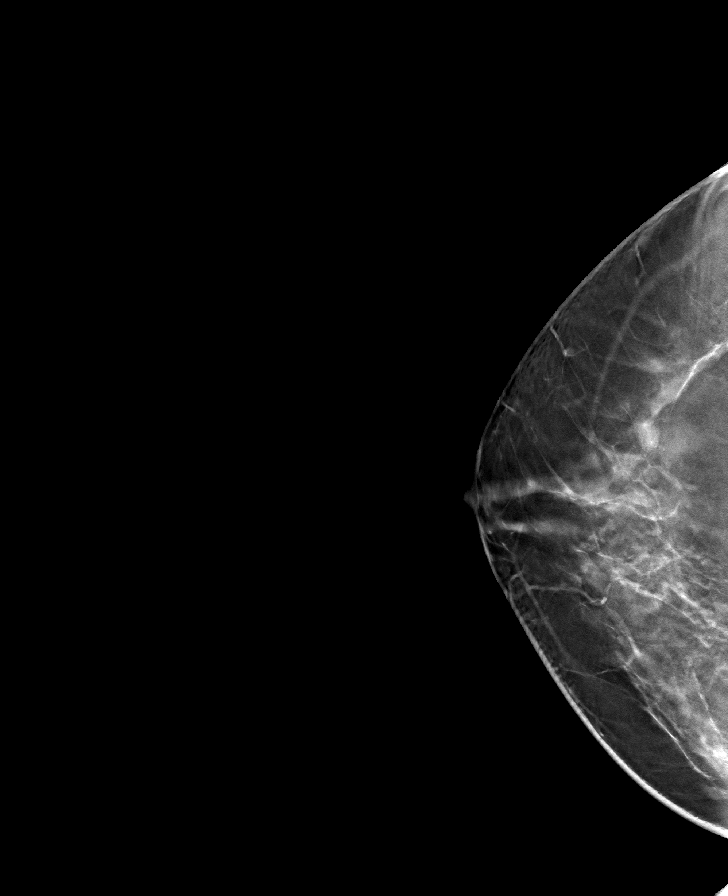

[R MLO tomo · tomo slice 49/97.0]
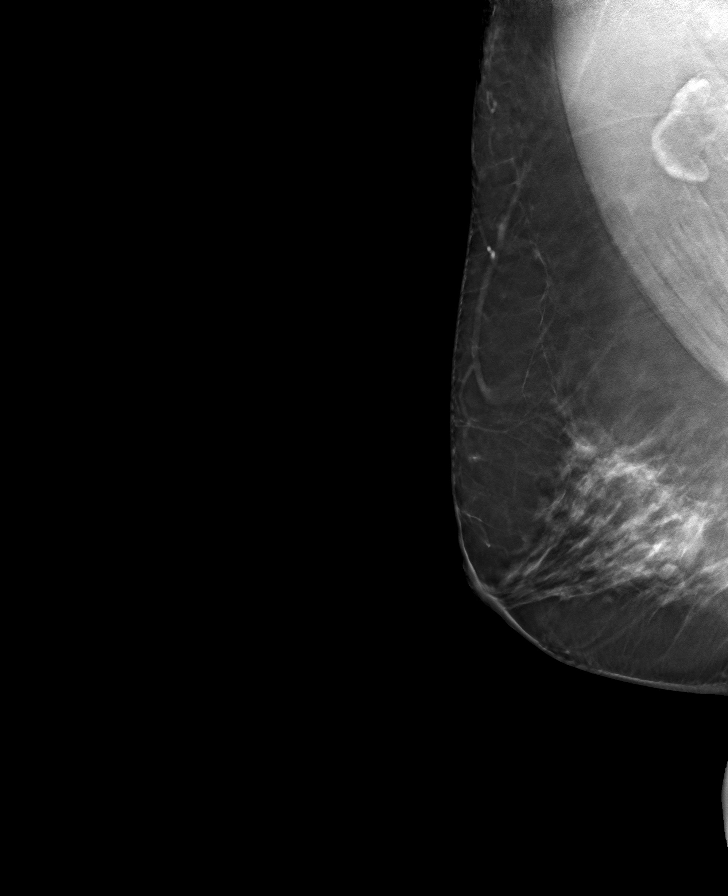

[L CC tomo · tomo slice 41/81.0]
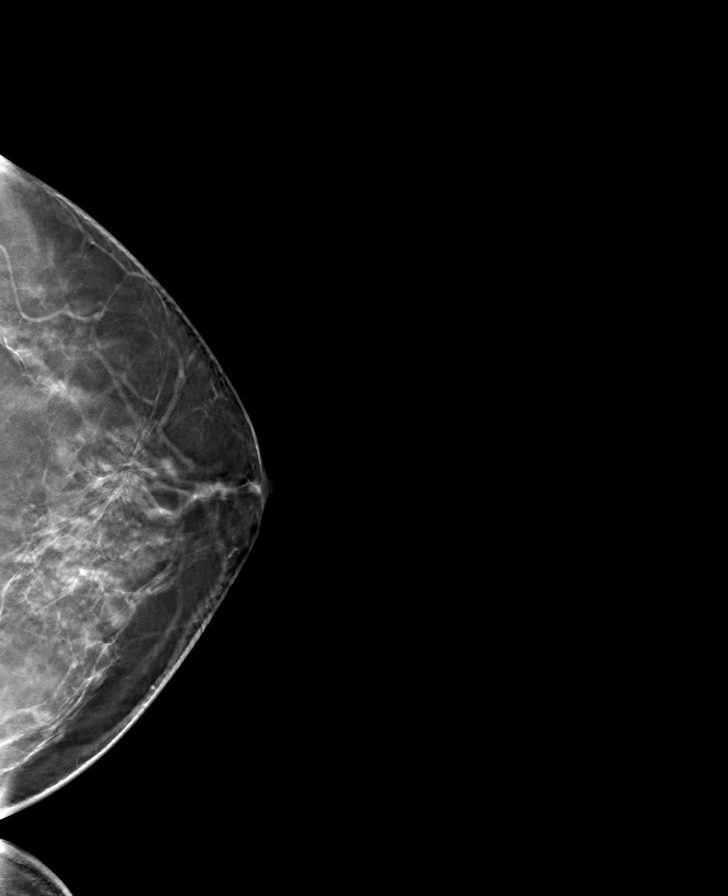

[8 of 24 positions shown; findings below may reference images not displayed]

ACR Breast Density Category b: There are scattered areas of
fibroglandular density.
FINDINGS: In the right breast a possible mass requires further evaluation.

In the left breast possible distortion requires further evaluation.

Images were processed with CAD.
IMPRESSION: Further evaluation is suggested for a possible mass in the right
breast.

Further evaluation is suggested for possible distortion in the left
breast.

RECOMMENDATION:
Diagnostic mammogram and possibly ultrasound of the LEFT breast and
ultrasound of the RIGHT breast. (Code:JJ-5-33P)

The patient will be contacted regarding the findings, and additional
imaging will be scheduled.

BI-RADS CATEGORY  0: Incomplete. Need additional imaging evaluation
and/or prior mammograms for comparison.

## 2019-09-27 ENCOUNTER — Inpatient Hospital Stay: Payer: 59

## 2019-09-30 ENCOUNTER — Encounter: Payer: Self-pay | Admitting: Oncology

## 2019-10-04 ENCOUNTER — Encounter: Payer: Self-pay | Admitting: Oncology

## 2019-10-04 ENCOUNTER — Other Ambulatory Visit: Payer: Self-pay | Admitting: Oncology

## 2019-10-04 ENCOUNTER — Other Ambulatory Visit: Payer: Self-pay

## 2019-10-04 ENCOUNTER — Inpatient Hospital Stay: Payer: 59 | Attending: Oncology | Admitting: Oncology

## 2019-10-04 VITALS — BP 142/82 | HR 97 | Temp 96.3°F | Resp 18 | Wt 188.1 lb

## 2019-10-04 DIAGNOSIS — N6489 Other specified disorders of breast: Secondary | ICD-10-CM | POA: Diagnosis not present

## 2019-10-04 DIAGNOSIS — E7800 Pure hypercholesterolemia, unspecified: Secondary | ICD-10-CM | POA: Insufficient documentation

## 2019-10-04 DIAGNOSIS — R202 Paresthesia of skin: Secondary | ICD-10-CM | POA: Diagnosis not present

## 2019-10-04 DIAGNOSIS — Z800 Family history of malignant neoplasm of digestive organs: Secondary | ICD-10-CM | POA: Diagnosis not present

## 2019-10-04 DIAGNOSIS — E785 Hyperlipidemia, unspecified: Secondary | ICD-10-CM | POA: Insufficient documentation

## 2019-10-04 DIAGNOSIS — E559 Vitamin D deficiency, unspecified: Secondary | ICD-10-CM | POA: Diagnosis not present

## 2019-10-04 DIAGNOSIS — L905 Scar conditions and fibrosis of skin: Secondary | ICD-10-CM | POA: Diagnosis not present

## 2019-10-04 DIAGNOSIS — R200 Anesthesia of skin: Secondary | ICD-10-CM | POA: Insufficient documentation

## 2019-10-04 DIAGNOSIS — G4700 Insomnia, unspecified: Secondary | ICD-10-CM | POA: Insufficient documentation

## 2019-10-04 DIAGNOSIS — Z803 Family history of malignant neoplasm of breast: Secondary | ICD-10-CM | POA: Diagnosis not present

## 2019-10-04 DIAGNOSIS — Z79899 Other long term (current) drug therapy: Secondary | ICD-10-CM | POA: Diagnosis not present

## 2019-10-04 DIAGNOSIS — R011 Cardiac murmur, unspecified: Secondary | ICD-10-CM | POA: Insufficient documentation

## 2019-10-04 DIAGNOSIS — D472 Monoclonal gammopathy: Secondary | ICD-10-CM | POA: Diagnosis not present

## 2019-10-04 DIAGNOSIS — R232 Flushing: Secondary | ICD-10-CM | POA: Diagnosis not present

## 2019-10-04 NOTE — Progress Notes (Signed)
Hematology/Oncology follow up  note [facility] Telephone:(336) [phone] Fax:([phone]   Patient Care Team: Andrea Dry, MD as PCP - General (Obstetrics and Gynecology) Andrea Dry, MD as PCP - OBGYN (Obstetrics and Gynecology) Andrea Jones, Andrea Regal, MD (Unknown Physician Specialty) Andrea Castilla Forest Gleason, MD (General Surgery)  REFERRING PROVIDER: [name], MD  CHIEF COMPLAINTS/REASON FOR VISIT:  Follow-up for MGUS  HISTORY OF PRESENTING ILLNESS:  Andrea Jones is a 59 y.o. female presents for follow-up of MGUS.  neurology work-up for foot numbness and tingling.Andrea Jones  SPEP showed 0.9 g/dL M spike,   , and IFE showed IgG Kappa monoclonal protein.  No aggravating or alleviated factors.  Associated signs or symptoms:  Neuropathy: Toe numbness and tingling. Denies weight loss, fever chills, bone pain, Bone pain: Denies Anemia denies  Patient also recently had an abnormal screening mammogram on 02/22/2019 and the patient was called back to perform unilateral left diagnostic breast mammogram on 03/06/2019 Showed irregular hypoechoic mass 0.6 x 0.4 x 0.7 cm in the left breast is suspicious for malignancy.  Axillary lymph node status was not commented on mammogram or targeted ultrasound. Patient is status post left breast mass biopsy-03/15/2019, biopsy showed radial scar/complex sclerosing lesion.   INTERVAL HISTORY Andrea Jones is a 58 y.o. female who has above history reviewed by me today presents for follow up visit for management of abnormal SPEP Problems and complaints are listed below: Patient had work-up done recently present to discuss work-up results. She also underwent left breast mass biopsy. Denies any new complaint today.  Review of Systems  Constitutional: Negative for appetite change, chills, fatigue and fever.  HENT:   Negative for hearing loss and voice change.   Eyes: Negative for eye problems.  Respiratory: Negative  for chest tightness and cough.   Cardiovascular: Negative for chest pain.  Gastrointestinal: Negative for abdominal distention, abdominal pain and blood in stool.  Endocrine: Negative for hot flashes.  Genitourinary: Negative for difficulty urinating and frequency.   Musculoskeletal: Negative for arthralgias.  Skin: Negative for itching and rash.  Neurological: Negative for extremity weakness.  Hematological: Negative for adenopathy.  Psychiatric/Behavioral: Negative for confusion.     MEDICAL HISTORY:  Past Medical History:  Diagnosis Date  . Family history of ovarian cancer    8/19 genetic testing letter sent  . Heart murmur    hx of per pt, "hasn't presented itself in years"  . Hot flashes    "pretty much gone"  . Hypercholesteremia   . Hyperlipidemia   . Insomnia   . Vitamin D deficiency 2016    SURGICAL HISTORY: Past Surgical History:  Procedure Laterality Date  . ABDOMINAL HYSTERECTOMY  2001  . APPENDECTOMY  1982  . BREAST BIOPSY Left 1999   cyst,benign  . BREAST BIOPSY Left 03/15/2019   [name] bx path pending venus clip  . CESAREAN SECTION  1985  . COLONOSCOPY  06/13/2012   [facility]. Normal Exam.  . HYSTERECTOMY ABDOMINAL WITH SALPINGECTOMY    . mgus    . OOPHORECTOMY    . OVARY SURGERY  2000    SOCIAL HISTORY: Social History   Socioeconomic History  . Marital status: Married    Spouse name: Not on file  . Number of children: Not on file  . Years of education: Not on file  . Highest education level: Not on file  Occupational History  . Not on file  Tobacco Use  . Smoking status: Never Smoker  .  Smokeless tobacco: Never Used  Substance and Sexual Activity  . Alcohol use: No    Alcohol/week: 0.0 standard drinks  . Drug use: No  . Sexual activity: Not Currently  Other Topics Concern  . Not on file  Social History Narrative   Lives at home with husband    Right handed   Social Determinants of Health   Financial Resource Strain:   . Difficulty of  Paying Living Expenses:   Food Insecurity:   . Worried About Charity fundraiser in the Last Year:   . Arboriculturist in the Last Year:   Transportation Needs:   . Film/video editor (Medical):   Andrea Jones Lack of Transportation (Non-Medical):   Physical Activity:   . Days of Exercise per Week:   . Minutes of Exercise per Session:   Stress:   . Feeling of Stress :   Social Connections:   . Frequency of Communication with Friends and Family:   . Frequency of Social Gatherings with Friends and Family:   . Attends Religious Services:   . Active Member of Clubs or Organizations:   . Attends Archivist Meetings:   Andrea Jones Marital Status:   Intimate Partner Violence:   . Fear of Current or Ex-Partner:   . Emotionally Abused:   Andrea Jones Physically Abused:   . Sexually Abused:     FAMILY HISTORY: Family History  Problem Relation Age of Onset  . Colon cancer Father 56  . Heart disease Father   . Congestive Heart Failure Mother   . Emphysema Mother   . Hypertension Mother   . Kidney failure Mother   . Neuropathy Mother   . Ovarian cancer Jones 26       Andrea Jones  . Breast cancer Cousin 48    ALLERGIES:  has No Known Allergies.  MEDICATIONS:  Current Outpatient Medications  Medication Sig Dispense Refill  . Biotin 10000 MCG TABS Take 1 each by mouth daily.    Andrea Jones CALCIUM PO Take 500 mg by mouth daily.    . Cholecalciferol (VITAMIN D3 PO) Take 1,000 Int'l Units by mouth daily.    Andrea Jones FIBER PO Take by mouth. Dietary fiber- 6 grams soluble fiber- 6 grams Chromium Picolinate- 200 mcg    . Omega-3 Fatty Acids (OMEGA 3 PO) Take 1,200 mg by mouth daily.    Andrea Jones OVER THE COUNTER MEDICATION daily. IMMUNE PLUS: Vitamin C- 180 mg  Vitamin D- 25 mcg Zinc- 5 mg     No current facility-administered medications for this visit.     PHYSICAL EXAMINATION: ECOG PERFORMANCE STATUS: 0 - Asymptomatic Vitals:   10/04/19 1038  BP: (!) 142/82  Pulse: 97  Resp: 18  Temp: (!) 96.3 F (35.7  C)   Filed Weights   10/04/19 1038  Weight: 188 lb 1.6 oz (85.3 kg)    Physical Exam Constitutional:      General: She is not in acute distress. HENT:     Head: Normocephalic and atraumatic.  Eyes:     General: No scleral icterus.    Pupils: Pupils are equal, round, and reactive to light.  Cardiovascular:     Rate and Rhythm: Normal rate and regular rhythm.     Heart sounds: Normal heart sounds.  Pulmonary:     Effort: Pulmonary effort is normal. No respiratory distress.     Breath sounds: No wheezing.  Abdominal:     General: Bowel sounds are normal. There is no distension.  Palpations: Abdomen is soft. There is no mass.     Tenderness: There is no abdominal tenderness.  Musculoskeletal:        General: No deformity. Normal range of motion.     Cervical back: Normal range of motion and neck supple.  Skin:    General: Skin is warm and Jones.     Findings: No erythema or rash.  Neurological:     Mental Status: She is alert and oriented to person, place, and time.     Cranial Nerves: No cranial nerve deficit.     Coordination: Coordination normal.  Psychiatric:        Behavior: Behavior normal.        Thought Content: Thought content normal.   Left breast status post biopsy.   LABORATORY DATA:  I have reviewed the data as listed Lab Results  Component Value Date   WBC 6.4 02/25/2019   HGB 15.2 02/25/2019   HCT 43.8 02/25/2019   MCV 88 02/25/2019   PLT 230 02/25/2019   Recent Labs    02/25/19 0858  NA 142  K 4.2  CL 103  CO2 22  GLUCOSE 105*  BUN 16  CREATININE 0.92  CALCIUM 9.6  GFRNONAA 69  GFRAA 80  PROT 7.5  ALBUMIN 4.6  AST 31  ALT 49*  ALKPHOS 54  BILITOT 0.4   Iron/TIBC/Ferritin/ %Sat No results found for: IRON, TIBC, FERRITIN, IRONPCTSAT    RADIOGRAPHIC STUDIES: I have personally reviewed the radiological images as listed and agreed with the findings in the report.  No results found.   ASSESSMENT & PLAN:  1. Radial scar of breast    2. MGUS (monoclonal gammopathy of unknown significance)   IgG MGUS Blood work was done through [facility], Results were independently reviewed by me and discussed with patient. M protein is stable at 0.9.  Free light chain slightly elevated at 2.23. Recommend patient to continue monitor counts every 6 months. Avoid prolonged use of NSAIDs.  #Radial scar/complex sclerosing lesion, discussed with patient that this is a benign lesion, recommend excision. I do not see that patient has a resection yet. Recommend patient to follow-up with surgery for evaluation of resection.  [facility] prescription for next visit in 6 months. All questions were answered. The patient knows to call the clinic with any problems questions or concerns.  Cc Andrea Dry, MD  Return of visit: 6 months Earlie Server, MD, PhD 10/04/2019

## 2019-10-04 NOTE — Progress Notes (Signed)
Patient here for follow up. No new concerns voiced.  °

## 2019-10-21 ENCOUNTER — Ambulatory Visit: Payer: 59 | Admitting: Neurology

## 2019-10-21 ENCOUNTER — Encounter: Payer: Self-pay | Admitting: Neurology

## 2019-10-21 ENCOUNTER — Other Ambulatory Visit: Payer: Self-pay

## 2019-10-21 VITALS — BP 144/86 | HR 70 | Temp 97.6°F | Ht 62.0 in | Wt 189.0 lb

## 2019-10-21 DIAGNOSIS — G629 Polyneuropathy, unspecified: Secondary | ICD-10-CM | POA: Diagnosis not present

## 2019-10-21 NOTE — Progress Notes (Signed)
[facility]    Provider:  Dr Jaynee Eagles Requesting Provider: [name], MD Primary Care Provider:  Teresa Coombs MD  CC:  Sensory symptoms in the great toes  Interval history 10/21/2019: Andrea Jones has been to and Andrea Jones is back walking, it has really helped a lot. Andrea Jones is walking better, the numbness and tingling in the toes is better. When Andrea Jones walks a lot Andrea Jones may feel it a little bit it comes but Andrea Jones is not feeling it during the day. Andrea Jones is feeling so much better. Andrea Jones still sees hematology for MGUS and is stable. Andrea Jones is very happy. Andrea Jones has pain in the thumb, the left hurts, the right got better, when Andrea Jones is using it more it hurts more, may be arthritis, the right improved. This is new. No numbness or tingling. Andrea Jones feels weakness opening a jar, its like the bone hurts not muscle pain. (neg Mcphalen's in the office), no nocturnal awakening, doesn't appear to be CTS but more arthrits so follow up with pcp, moving from her home made the left thumb worse and better with resting. Andrea Jones has some joint pain in her knees as well. Andrea Jones has not been exercising much, discussed exercising and diet. Andrea Jones has gained weight. Discussed healthy weight and wellness center.  Interval history: Not worsening in the feet. Right now the toes are not numb. More pain then numbness. Right now her feet are not symptomatic. Recommend podiatry, may be plantar fasciitis. Andrea Jones does not notice it in the mornings, notices it sporadically during the day, moreso in the evening. Andrea Jones stopped the B complex due to elevated b6 and now starting a multi-vitamin.   HPI:  Andrea Jones is a 59 y.o. female here as requested by [name], MD for peripheral neuropathy. PMHx heart murmur, hot flashes, hypercholesterolemia/lipidemia, insomnia, vit d def, abnormal PAP smears and cervical dysplasia. Mother had peripheral neuropathy, passed away at 57, Andrea Jones had significant pain in her feet, Andrea Jones did not have diabetes, mother ended up  in a wheelchair and Andrea Jones could not walk without her shoes, Andrea Jones had a lot of numbness on her feet and had a problem wit balance, Andrea Jones had a lot of burning and sensitivity in the feet to touch. Patient has started getting symptoms in her 2 big toes, numbness, pain, no burning but it has scared her, currently no symptoms "comes and goes", not worse with sitting, standing walking, unclear when Andrea Jones feels it and Andrea Jones can't tell me if anything makes it worse or better, Andrea Jones exercised too which doesn't seem to exacerbate it. Mostly on the sides of the great toes bilaterally, Andrea Jones does not wear tight shows or heels, The bottoms of her mother's feet were flat, her toes were a little crooked, Andrea Jones had a problem with her toe nails never healthy, radiated up into the legs, mother was on gabapentin, Andrea Jones doesn't know if her mother was ever diagnosed but Andrea Jones was taking B12 shots. Patient's toes started being symptomatic for a few months, Andrea Jones started taking supplements, Andrea Jones started the B vitamins after the symptoms, Andrea Jones started the immune health in March a few months before symptoms. Andrea Jones has some pain in her thumbs, no numbness or sensory changes Andrea Jones thinking it may be arthritis or ovreruse unclear but no sensory symptoms. No other focal neurologic deficits, associated symptoms, inciting events or modifiable factors.  Reviewed notes, labs and imaging from outside physicians, which showed:  I reviewed multiple notes from Dr. Kenton Kingfisher from 2019-2020.  Reports  over the last year bilateral numbness and tingling of the big toes that is intermittent and not associated with anything.  Normal testing for diabetes yearly.  Family history of peripheral neuropathy in the mother so patient is concerned.  Normal diabetic testing per Dr. Kenton Kingfisher' notes but I don't see any labs in [facility].  Never smoked.  No alcohol use or drug use. Review of symptoms positive for tingling neurologically otherwise negative.  Andrea Jones had labs about amonth ago, will  request  Review of Systems: Patient complains of symptoms per HPI as well as the following symptoms:arthritic joint pain. Pertinent negatives and positives per HPI. All others negative   Social History   Socioeconomic History  . Marital status: Married    Spouse name: Not on file  . Number of children: 3  . Years of education: Not on file  . Highest education level: Not on file  Occupational History  . Not on file  Tobacco Use  . Smoking status: Never Smoker  . Smokeless tobacco: Never Used  Substance and Sexual Activity  . Alcohol use: No    Alcohol/week: 0.0 standard drinks  . Drug use: No  . Sexual activity: Not Currently  Other Topics Concern  . Not on file  Social History Narrative   Lives at home with husband    Right handed   Caffeine: maybe 1 cup maybe 3 days a week   Social Determinants of Health   Financial Resource Strain:   . Difficulty of Paying Living Expenses:   Food Insecurity:   . Worried About Charity fundraiser in the Last Year:   . Arboriculturist in the Last Year:   Transportation Needs:   . Film/video editor (Medical):   Marland Kitchen Lack of Transportation (Non-Medical):   Physical Activity:   . Days of Exercise per Week:   . Minutes of Exercise per Session:   Stress:   . Feeling of Stress :   Social Connections:   . Frequency of Communication with Friends and Family:   . Frequency of Social Gatherings with Friends and Family:   . Attends Religious Services:   . Active Member of Clubs or Organizations:   . Attends Archivist Meetings:   Marland Kitchen Marital Status:   Intimate Partner Violence:   . Fear of Current or Ex-Partner:   . Emotionally Abused:   Marland Kitchen Physically Abused:   . Sexually Abused:     Family History  Problem Relation Age of Onset  . Colon cancer Father 50  . Heart disease Father   . Congestive Heart Failure Mother   . Emphysema Mother   . Hypertension Mother   . Kidney failure Mother   . Neuropathy Mother   . Ovarian  cancer Sister 77       pat 1/2 sister  . Breast cancer Cousin 62    Past Medical History:  Diagnosis Date  . Family history of ovarian cancer    8/19 genetic testing letter sent  . Heart murmur    hx of per pt, "hasn't presented itself in years"  . Hot flashes    "pretty much gone"  . Hypercholesteremia   . Hyperlipidemia   . Insomnia   . MGUS (monoclonal gammopathy of unknown significance)    sees Dr. Tasia Catchings with hematology  . Vitamin D deficiency 2016    Patient Active Problem List   Diagnosis Date Noted  . Small fiber neuropathy 04/22/2019  . Atypical squamous cells cannot exclude  high grade squamous intraepithelial lesion on cytologic smear of cervix (ASC-H) 01/19/2018  . Heme positive stool 08/27/2014    Past Surgical History:  Procedure Laterality Date  . ABDOMINAL HYSTERECTOMY  2001  . APPENDECTOMY  1982  . BREAST BIOPSY Left 1999   cyst,benign  . BREAST BIOPSY Left 03/15/2019   [name] bx path pending venus clip  . CESAREAN SECTION  1985  . COLONOSCOPY  06/13/2012   [facility]. Normal Exam.  . HYSTERECTOMY ABDOMINAL WITH SALPINGECTOMY    . mgus    . OOPHORECTOMY    . OVARY SURGERY  2000    Current Outpatient Medications  Medication Sig Dispense Refill  . Biotin 10000 MCG TABS Take 1 each by mouth daily.    Marland Kitchen CALCIUM PO Take 500 mg by mouth daily.    . Cholecalciferol (VITAMIN D3 PO) Take 1,000 Int'l Units by mouth daily.    Marland Kitchen FIBER PO Take by mouth. Dietary fiber- 6 grams soluble fiber- 6 grams Chromium Picolinate- 200 mcg    . Omega-3 Fatty Acids (OMEGA 3 PO) Take 1,200 mg by mouth daily.    Marland Kitchen OVER THE COUNTER MEDICATION daily. IMMUNE PLUS: Vitamin C- 180 mg  Vitamin D- 25 mcg Zinc- 5 mg     No current facility-administered medications for this visit.    Allergies as of 10/21/2019  . (No Known Allergies)    Vitals: BP (!) 144/86 (BP Location: Left Arm, Patient Position: Sitting)   Pulse 70   Temp 97.6 F (36.4 C) Comment: taken at front  Ht 5\' 2"   (1.575 m)   Wt 189 lb (85.7 kg)   BMI 34.57 kg/m  Last Weight:  Wt Readings from Last 1 Encounters:  10/21/19 189 lb (85.7 kg)   Last Height:   Ht Readings from Last 1 Encounters:  10/21/19 5\' 2"  (1.575 m)   Physical exam: Exam: Gen: NAD, conversant, well nourised, obese, well groomed                     CV: RRR, no MRG. No Carotid Bruits. No peripheral edema, warm, nontender Eyes: Conjunctivae clear without exudates or hemorrhage  Neuro: Detailed Neurologic Exam  Speech:    Speech is normal; fluent and spontaneous with normal comprehension.  Cognition:    The patient is oriented to person, place, and time;     recent and remote memory intact;     language fluent;     normal attention, concentration,     fund of knowledge Cranial Nerves:    The pupils are equal, round, and reactive to light. The fundi are normal and spontaneous venous pulsations are present. Visual fields are full to finger confrontation. Extraocular movements are intact. Trigeminal sensation is intact and the muscles of mastication are normal. The face is symmetric. The palate elevates in the midline. Hearing intact. Voice is normal. Shoulder shrug is normal. The tongue has normal motion without fasciculations.   Coordination:    No dysmetria or ataxia  Gait:    Normal native gait  Motor Observation:    No asymmetry, no atrophy, and no involuntary movements noted. Tone:    Normal muscle tone.    Posture:    Posture is normal. normal erect    Strength:    Strength is V/V in the upper and lower limbs.      Sensation: intact to LT     Reflex Exam:  DTR's:    Deep tendon reflexes in the upper and lower extremities  are normal bilaterally.   Toes:    The toes are downgoing bilaterally.   Clonus:    Clonus is absent.    Assessment/Plan:  [age] year old with sensory symptoms in the great toes, likely small fiber of unknown etiology. Labs showed +MGUS, elevated b levels, Andrea Jones stopped her B  complex. Mild neuropathic small-fiber symptoms in the great toes. Much improved with orthotics, follows with hematology stable.  Wear good arch supports and continue to follow with a podiatrist just to rule out any structural foot issues.   F/u with pcp for thumb pain likely arthritic  Had a long discussion regarding diet, discussed healthy weight and wellness center, provided information for her to call no referral needed. Discussed central obesity has been linked to polyneuropathy even in normoglycemic patients.   Will schedule as needed   Cc: Teresa Coombs, MD  Sarina Ill, MD  [facility] [place], [place] [address]  Phone [phone] Fax [phone]  I spent 30 minutes of face-to-face and non-face-to-face time with patient on the  1. Small fiber neuropathy    diagnosis.  This included previsit chart review, lab review, study review, order entry, electronic health record documentation, patient education on the different diagnostic and therapeutic options, counseling and coordination of care, risks and benefits of management, compliance, or risk factor reduction

## 2020-01-14 ENCOUNTER — Ambulatory Visit (INDEPENDENT_AMBULATORY_CARE_PROVIDER_SITE_OTHER): Payer: 59 | Admitting: Obstetrics & Gynecology

## 2020-01-14 ENCOUNTER — Other Ambulatory Visit (HOSPITAL_COMMUNITY)
Admission: RE | Admit: 2020-01-14 | Discharge: 2020-01-14 | Disposition: A | Discharge status: Home / Self Care | Payer: 59 | Source: Ambulatory Visit | Attending: Obstetrics & Gynecology | Admitting: Obstetrics & Gynecology

## 2020-01-14 ENCOUNTER — Other Ambulatory Visit: Payer: Self-pay

## 2020-01-14 ENCOUNTER — Encounter: Payer: Self-pay | Admitting: Obstetrics & Gynecology

## 2020-01-14 VITALS — BP 130/70 | HR 76 | Resp 18 | Ht 62.0 in | Wt 177.6 lb

## 2020-01-14 DIAGNOSIS — Z01419 Encounter for gynecological examination (general) (routine) without abnormal findings: Secondary | ICD-10-CM

## 2020-01-14 DIAGNOSIS — Z1211 Encounter for screening for malignant neoplasm of colon: Secondary | ICD-10-CM

## 2020-01-14 DIAGNOSIS — N870 Mild cervical dysplasia: Secondary | ICD-10-CM | POA: Insufficient documentation

## 2020-01-14 DIAGNOSIS — Z1231 Encounter for screening mammogram for malignant neoplasm of breast: Secondary | ICD-10-CM | POA: Diagnosis not present

## 2020-01-14 DIAGNOSIS — D069 Carcinoma in situ of cervix, unspecified: Secondary | ICD-10-CM

## 2020-01-14 NOTE — Patient Instructions (Signed)
PAP every three years Mammogram every year    Call [phone] to schedule at [facility] Colonoscopy every 10 years Labs yearly (with PCP)  Thank you for choosing [facility]. As part of our ongoing efforts to improve patient experience, we would appreciate your feedback. Please fill out the short survey that you will receive by mail or MyChart. Your opinion is important to [name]! - Dr. Kenton Kingfisher

## 2020-01-14 NOTE — Progress Notes (Signed)
HPI:      [name] is a 59 y.o. [phone] who LMP was in the past and she is s/p [facility], she presents today for her annual examination.  Prior PAP LGSIL 6 mos ago; her prior PAP was 12 months ago and approximate date 01/2019 and was abnormal: LGSIL; prior PAP 18 mos ago normal, and prior to that ASC-H. She has had a prior colposcopy. Prior biopsies (if done) were Normal.   The patient has no complaints today. The patient is sexually active. Herlast mammogram: was abnormal: left SCNB done in Oct 2020; benign.  The patient does perform self breast exams.  There is no notable family history of breast or ovarian cancer in her family. The patient is not taking hormone replacement therapy. Patient denies post-menopausal vaginal bleeding.   The patient has regular exercise: yes. The patient denies current symptoms of depression.    GYN Hx: Last Colonoscopy:9 years ago. Normal.  Last DEXA: never ago.    PMHx: Past Medical History:  Diagnosis Date  . Family history of ovarian cancer    8/19 genetic testing letter sent  . Heart murmur    hx of per pt, "hasn't presented itself in years"  . Hot flashes    "pretty much gone"  . Hypercholesteremia   . Hyperlipidemia   . Insomnia   . MGUS (monoclonal gammopathy of unknown significance)    sees Dr. Tasia Catchings with hematology  . Vitamin D deficiency 2016   Past Surgical History:  Procedure Laterality Date  . ABDOMINAL HYSTERECTOMY  2001  . APPENDECTOMY  1982  . BREAST BIOPSY Left 1999   cyst,benign  . BREAST BIOPSY Left 03/15/2019   [name] bx path pending venus clip  . CESAREAN SECTION  1985  . COLONOSCOPY  06/13/2012   [facility]. Normal Exam.  . HYSTERECTOMY ABDOMINAL WITH SALPINGECTOMY    . mgus    . OOPHORECTOMY    . OVARY SURGERY  2000   Family History  Problem Relation Age of Onset  . Colon cancer Father 64  . Heart disease Father   . Congestive Heart Failure Mother   . Emphysema Mother   . Hypertension Mother   . Kidney failure  Mother   . Neuropathy Mother   . Ovarian cancer Sister 67       pat 1/2 sister  . Breast cancer Cousin 21   Social History   Tobacco Use  . Smoking status: Never Smoker  . Smokeless tobacco: Never Used  Vaping Use  . Vaping Use: Never used  Substance Use Topics  . Alcohol use: No    Alcohol/week: 0.0 standard drinks  . Drug use: No    Current Outpatient Medications:  .  Biotin 10000 MCG TABS, Take 1 each by mouth daily., Disp: , Rfl:  .  CALCIUM PO, Take 500 mg by mouth daily., Disp: , Rfl:  .  Cholecalciferol (VITAMIN D3 PO), Take 1,000 Int'l Units by mouth daily., Disp: , Rfl:  .  FIBER PO, Take by mouth. Dietary fiber- 6 grams soluble fiber- 6 grams Chromium Picolinate- 200 mcg, Disp: , Rfl:  .  Omega-3 Fatty Acids (OMEGA 3 PO), Take 1,200 mg by mouth daily., Disp: , Rfl:  .  OVER THE COUNTER MEDICATION, daily. IMMUNE PLUS: Vitamin C- 180 mg  Vitamin D- 25 mcg Zinc- 5 mg, Disp: , Rfl:  Allergies: Patient has no known allergies.  Review of Systems  Constitutional: Negative for chills, fever and malaise/fatigue.  HENT: Negative for  congestion, sinus pain and sore throat.   Eyes: Negative for blurred vision and pain.  Respiratory: Negative for cough and wheezing.   Cardiovascular: Negative for chest pain and leg swelling.  Gastrointestinal: Negative for abdominal pain, constipation, diarrhea, heartburn, nausea and vomiting.  Genitourinary: Negative for dysuria, frequency, hematuria and urgency.  Musculoskeletal: Negative for back pain, joint pain, myalgias and neck pain.  Skin: Negative for itching and rash.  Neurological: Negative for dizziness, tremors and weakness.  Endo/Heme/Allergies: Does not bruise/bleed easily.  Psychiatric/Behavioral: Negative for depression. The patient is not nervous/anxious and does not have insomnia.     Objective: There were no vitals taken for this visit. There were no vitals filed for this visit. There is no height or weight on file to  calculate BMI. Physical Exam Constitutional:      General: She is not in acute distress.    Appearance: She is well-developed.  Genitourinary:     Pelvic exam was performed with patient supine.     Urethra, bladder, vagina and rectum normal.     No lesions in the vagina.     No vaginal bleeding.     No cervical motion tenderness, friability, lesion or polyp.     Uterus is absent.     No right or left adnexal mass present.     Right adnexa not tender.     Left adnexa not tender.     Genitourinary Comments: Cx flush w vaginal apex  HENT:     Head: Normocephalic and atraumatic. No laceration.     Right Ear: Hearing normal.     Left Ear: Hearing normal.     Mouth/Throat:     Pharynx: Uvula midline.  Eyes:     Pupils: Pupils are equal, round, and reactive to light.  Neck:     Thyroid: No thyromegaly.  Cardiovascular:     Rate and Rhythm: Normal rate and regular rhythm.     Heart sounds: No murmur heard.  No friction rub. No gallop.   Pulmonary:     Effort: Pulmonary effort is normal. No respiratory distress.     Breath sounds: Normal breath sounds. No wheezing.  Chest:     Breasts:        Right: No mass, skin change or tenderness.        Left: No mass, skin change or tenderness.  Abdominal:     General: Bowel sounds are normal. There is no distension.     Palpations: Abdomen is soft.     Tenderness: There is no abdominal tenderness. There is no rebound.  Musculoskeletal:        General: Normal range of motion.     Cervical back: Normal range of motion and neck supple.  Neurological:     Mental Status: She is alert and oriented to person, place, and time.     Cranial Nerves: No cranial nerve deficit.  Skin:    General: Skin is warm and dry.  Psychiatric:        Judgment: Judgment normal.  Vitals reviewed.     Assessment: Annual Exam 1. Women's annual routine gynecological examination   2. Encounter for screening mammogram for malignant neoplasm of breast   3. Low  grade cervical glandular intraepithelial neoplasia     Plan:            1.  Cervical Screening-  Pap smear done today, continued every 6 mos until series of normal.  Options for cryo/LEEP/trachelectomy also presented to patient  for residual or progressive disease.  2. Breast screening- Exam annually and mammogram scheduled  3. Colonoscopy every 10 years, Hemoccult testing after age 36  4. Labs managed by PCP  5. Counseling for hormonal therapy: none              6. FRAX - FRAX score for assessing the 10 year probability for fracture calculated and discussed today.  Based on age and score today, DEXA is not currently scheduled.    F/U  Return in about 6 months (around 07/16/2020) for Follow up.  Barnett Applebaum, MD, [facility], [facility] 01/14/2020  8:06 AM

## 2020-01-16 LAB — CYTOLOGY - PAP: Adequacy: ABSENT

## 2020-01-16 NOTE — Progress Notes (Signed)
Sch Follow up in 6 months

## 2020-02-05 ENCOUNTER — Encounter: Payer: Self-pay | Admitting: Obstetrics and Gynecology

## 2020-04-03 ENCOUNTER — Other Ambulatory Visit: Payer: Self-pay

## 2020-04-03 ENCOUNTER — Encounter: Payer: Self-pay | Admitting: Oncology

## 2020-04-03 ENCOUNTER — Inpatient Hospital Stay: Payer: 59 | Attending: Oncology | Admitting: Oncology

## 2020-04-03 VITALS — BP 135/79 | HR 79 | Temp 98.8°F | Resp 18 | Wt 188.7 lb

## 2020-04-03 DIAGNOSIS — E785 Hyperlipidemia, unspecified: Secondary | ICD-10-CM | POA: Insufficient documentation

## 2020-04-03 DIAGNOSIS — R7401 Elevation of levels of liver transaminase levels: Secondary | ICD-10-CM | POA: Diagnosis not present

## 2020-04-03 DIAGNOSIS — N6489 Other specified disorders of breast: Secondary | ICD-10-CM | POA: Diagnosis not present

## 2020-04-03 DIAGNOSIS — D472 Monoclonal gammopathy: Secondary | ICD-10-CM | POA: Insufficient documentation

## 2020-04-03 NOTE — Progress Notes (Signed)
Patient here for follow up. No new concerns voiced.  °

## 2020-04-03 NOTE — Progress Notes (Addendum)
Hematology/Oncology follow up  note [facility] Telephone:(336) [phone] [phone]   Patient Care Team: [name], MD as PCP - General (Obstetrics and Gynecology) [name], MD as PCP - OBGYN (Obstetrics and Gynecology) Ammie Dalton, Okey Regal, MD (Unknown Physician Specialty) Bary Castilla Forest Gleason, MD (General Surgery)  REFERRING PROVIDER: [name], MD  CHIEF COMPLAINTS/REASON FOR VISIT:  Follow-up for MGUS  HISTORY OF PRESENTING ILLNESS:  Andrea Jones is a 59 y.o. female presents for follow-up of MGUS.  neurology work-up for foot numbness and tingling.Marland Kitchen  SPEP showed 0.9 g/dL M spike,   , and IFE showed IgG Kappa monoclonal protein.  No aggravating or alleviated factors.  Associated signs or symptoms:  Neuropathy: Toe numbness and tingling. Denies weight loss, fever chills, bone pain, Bone pain: Denies Anemia denies  Patient also recently had an abnormal screening mammogram on 02/22/2019 and the patient was called back to perform unilateral left diagnostic breast mammogram on 03/06/2019 Showed irregular hypoechoic mass 0.6 x 0.4 x 0.7 cm in the left breast is suspicious for malignancy.  Axillary lymph node status was not commented on mammogram or targeted ultrasound. Patient is status post left breast mass biopsy-03/15/2019, biopsy showed radial scar/complex sclerosing lesion.   INTERVAL HISTORY Andrea Jones is a 59 y.o. female who has above history reviewed by me today presents for follow up visit for management of abnormal SPEP Problems and complaints are listed below: Patient has no new concerns 03/15/2019 left breast biopsy showed radial scar/complex sclerosing lesion   Review of Systems  Constitutional: Negative for appetite change, chills, fatigue and fever.  HENT:   Negative for hearing loss and voice change.   Eyes: Negative for eye problems.  Respiratory: Negative for chest tightness and cough.    Cardiovascular: Negative for chest pain.  Gastrointestinal: Negative for abdominal distention, abdominal pain and blood in stool.  Endocrine: Negative for hot flashes.  Genitourinary: Negative for difficulty urinating and frequency.   Musculoskeletal: Negative for arthralgias.  Skin: Negative for itching and rash.  Neurological: Negative for extremity weakness.  Hematological: Negative for adenopathy.  Psychiatric/Behavioral: Negative for confusion.     MEDICAL HISTORY:  Past Medical History:  Diagnosis Date  . Family history of ovarian cancer    8/19 and 9/21 cancer genetic testing letter sent  . Heart murmur    hx of per pt, "hasn't presented itself in years"  . Hot flashes    "pretty much gone"  . Hypercholesteremia   . Hyperlipidemia   . Insomnia   . MGUS (monoclonal gammopathy of unknown significance)    sees Dr. Tasia Catchings with hematology  . Vitamin D deficiency 2016    SURGICAL HISTORY: Past Surgical History:  Procedure Laterality Date  . ABDOMINAL HYSTERECTOMY  2001  . APPENDECTOMY  1982  . BREAST BIOPSY Left 1999   cyst,benign  . BREAST BIOPSY Left 03/15/2019   [name] bx path pending venus clip  . CESAREAN SECTION  1985  . COLONOSCOPY  06/13/2012   [facility]. Normal Exam.  . HYSTERECTOMY ABDOMINAL WITH SALPINGECTOMY    . mgus    . OOPHORECTOMY    . OVARY SURGERY  2000    SOCIAL HISTORY: Social History   Socioeconomic History  . Marital status: Married    Spouse name: Not on file  . Number of children: 3  . Years of education: Not on file  . Highest education level: Not on file  Occupational History  . Not on file  Tobacco Use  . Smoking status: Never Smoker  . Smokeless tobacco: Never Used  Vaping Use  . Vaping Use: Never used  Substance and Sexual Activity  . Alcohol use: No    Alcohol/week: 0.0 standard drinks  . Drug use: No  . Sexual activity: Not Currently  Other Topics Concern  . Not on file  Social History Narrative   Lives at home with  husband    Right handed   Caffeine: maybe 1 cup maybe 3 days a week   Social Determinants of Health   Financial Resource Strain:   . Difficulty of Paying Living Expenses: Not on file  Food Insecurity:   . Worried About Charity fundraiser in the Last Year: Not on file  . Ran Out of Food in the Last Year: Not on file  Transportation Needs:   . Lack of Transportation (Medical): Not on file  . Lack of Transportation (Non-Medical): Not on file  Physical Activity:   . Days of Exercise per Week: Not on file  . Minutes of Exercise per Session: Not on file  Stress:   . Feeling of Stress : Not on file  Social Connections:   . Frequency of Communication with Friends and Family: Not on file  . Frequency of Social Gatherings with Friends and Family: Not on file  . Attends Religious Services: Not on file  . Active Member of Clubs or Organizations: Not on file  . Attends Archivist Meetings: Not on file  . Marital Status: Not on file  Intimate Partner Violence:   . Fear of Current or Ex-Partner: Not on file  . Emotionally Abused: Not on file  . Physically Abused: Not on file  . Sexually Abused: Not on file    FAMILY HISTORY: Family History  Problem Relation Age of Onset  . Colon cancer Father 12  . Heart disease Father   . Congestive Heart Failure Mother   . Emphysema Mother   . Hypertension Mother   . Kidney failure Mother   . Neuropathy Mother   . Ovarian cancer Sister 23       pat 1/2 sister  . Breast cancer Cousin 40    ALLERGIES:  has No Known Allergies.  MEDICATIONS:  Current Outpatient Medications  Medication Sig Dispense Refill  . Biotin 10000 MCG TABS Take 1 each by mouth daily.    Marland Kitchen CALCIUM PO Take 500 mg by mouth daily.    . Cholecalciferol (VITAMIN D3 PO) Take 1,000 Int'l Units by mouth daily.    Marland Kitchen FIBER PO Take by mouth. Dietary fiber- 6 grams soluble fiber- 6 grams Chromium Picolinate- 200 mcg    . Omega-3 Fatty Acids (OMEGA 3 PO) Take 1,200 mg by  mouth daily.    Marland Kitchen OVER THE COUNTER MEDICATION daily. IMMUNE PLUS: Vitamin C- 180 mg  Vitamin D- 25 mcg Zinc- 5 mg     No current facility-administered medications for this visit.     PHYSICAL EXAMINATION: ECOG PERFORMANCE STATUS: 0 - Asymptomatic Vitals:   04/03/20 1347  BP: 135/79  Pulse: 79  Resp: 18  Temp: 98.8 F (37.1 C)   Filed Weights   04/03/20 1347  Weight: 188 lb 11.2 oz (85.6 kg)    Physical Exam Constitutional:      General: She is not in acute distress. HENT:     Head: Normocephalic and atraumatic.  Eyes:     General: No scleral icterus.    Pupils: Pupils are equal, round, and  reactive to light.  Cardiovascular:     Rate and Rhythm: Normal rate and regular rhythm.     Heart sounds: Normal heart sounds.  Pulmonary:     Effort: Pulmonary effort is normal. No respiratory distress.     Breath sounds: No wheezing.  Abdominal:     General: Bowel sounds are normal. There is no distension.     Palpations: Abdomen is soft. There is no mass.     Tenderness: There is no abdominal tenderness.  Musculoskeletal:        General: No deformity. Normal range of motion.     Cervical back: Normal range of motion and neck supple.  Skin:    General: Skin is warm and dry.     Findings: No erythema or rash.  Neurological:     Mental Status: She is alert and oriented to person, place, and time.     Cranial Nerves: No cranial nerve deficit.     Coordination: Coordination normal.  Psychiatric:        Behavior: Behavior normal.        Thought Content: Thought content normal.      LABORATORY DATA:  I have reviewed the data as listed Lab Results  Component Value Date   WBC 6.4 02/25/2019   HGB 15.2 02/25/2019   HCT 43.8 02/25/2019   MCV 88 02/25/2019   PLT 230 02/25/2019   No results for input(s): NA, K, CL, CO2, GLUCOSE, BUN, CREATININE, CALCIUM, GFRNONAA, GFRAA, PROT, ALBUMIN, AST, ALT, ALKPHOS, BILITOT, BILIDIR, IBILI in the last 8760  hours. Iron/TIBC/Ferritin/ %Sat No results found for: IRON, TIBC, FERRITIN, IRONPCTSAT   09/27/19 M protein 0.9  RADIOGRAPHIC STUDIES: I have personally reviewed the radiological images as listed and agreed with the findings in the report.  No results found.   ASSESSMENT & PLAN:  1. Radial scar of breast   2. MGUS (monoclonal gammopathy of unknown significance)   3. Transaminitis   # IgG MGUS Blood work was done through [employer] [employer] are reviewed and discussed with patient. Stable kidney function. Protein electrophoresis showed M protein level at 1.1 Recommend continue follow-up every 6 months.  #Radial scar/complex sclerosing lesion, discussed with patient that this is a benign lesion, recommend excision. Recommend surgical evaluation for resection.  I will refer this patient to breast cancer nurse navigator.  #Transaminitis, mild.  Discussed with patient about establish care with primary care provider and initiate work-up.  Or I can initiate some work-up for her.  Patient says that she is going to establish care with primary care provider and prefer to defer work-up until she sees PCP.  [facility] prescription for next visit in 6 months. All questions were answered. The patient knows to call the clinic with any problems questions or concerns.   Return of visit: 6 months Earlie Server, MD, PhD 04/03/2020

## 2020-04-06 ENCOUNTER — Other Ambulatory Visit: Payer: Self-pay | Admitting: Obstetrics & Gynecology

## 2020-04-06 ENCOUNTER — Encounter: Payer: Self-pay | Admitting: *Deleted

## 2020-04-06 DIAGNOSIS — R928 Other abnormal and inconclusive findings on diagnostic imaging of breast: Secondary | ICD-10-CM

## 2020-04-06 NOTE — Progress Notes (Signed)
Called patient today per Dr. Collie Siad request.  Patient states she was seen by Dr. Marlou Starks last year for follow up after benign biopsy, but with recommendation for surgical excision.  States Dr. Marlou Starks did not recommend surgical excision.  Patient has not had her mammogram for 2021.  Recommended that she schedule her annual mammogram.  She is agreeable.

## 2020-04-07 ENCOUNTER — Encounter: Payer: Self-pay | Admitting: Oncology

## 2020-04-08 ENCOUNTER — Other Ambulatory Visit: Payer: Self-pay | Admitting: Obstetrics & Gynecology

## 2020-04-08 DIAGNOSIS — R928 Other abnormal and inconclusive findings on diagnostic imaging of breast: Secondary | ICD-10-CM

## 2020-04-08 DIAGNOSIS — N6320 Unspecified lump in the left breast, unspecified quadrant: Secondary | ICD-10-CM

## 2020-04-20 NOTE — Progress Notes (Signed)
Patient cast today for cmfo to address b/l foot pain; plan on device that provides RF stability, longitudinal arch support, and ff cushioning.

## 2020-04-24 ENCOUNTER — Ambulatory Visit
Admission: RE | Admit: 2020-04-24 | Discharge: 2020-04-24 | Disposition: A | Discharge status: Home / Self Care | Payer: 59 | Source: Ambulatory Visit | Attending: Obstetrics & Gynecology | Admitting: Obstetrics & Gynecology

## 2020-04-24 ENCOUNTER — Other Ambulatory Visit: Payer: Self-pay

## 2020-04-24 DIAGNOSIS — R928 Other abnormal and inconclusive findings on diagnostic imaging of breast: Secondary | ICD-10-CM

## 2020-04-24 DIAGNOSIS — N6320 Unspecified lump in the left breast, unspecified quadrant: Secondary | ICD-10-CM | POA: Insufficient documentation

## 2020-05-05 ENCOUNTER — Telehealth: Payer: Self-pay

## 2020-05-05 ENCOUNTER — Other Ambulatory Visit: Payer: Self-pay

## 2020-05-05 ENCOUNTER — Encounter: Payer: Self-pay | Admitting: Emergency Medicine

## 2020-05-05 ENCOUNTER — Emergency Department: Payer: 59

## 2020-05-05 ENCOUNTER — Emergency Department
Admission: EM | Admit: 2020-05-05 | Discharge: 2020-05-05 | Disposition: A | Discharge status: Home / Self Care | Payer: 59 | Attending: Emergency Medicine | Admitting: Emergency Medicine

## 2020-05-05 DIAGNOSIS — I10 Essential (primary) hypertension: Secondary | ICD-10-CM | POA: Diagnosis not present

## 2020-05-05 DIAGNOSIS — R519 Headache, unspecified: Secondary | ICD-10-CM | POA: Diagnosis present

## 2020-05-05 LAB — CBC
HCT: 42.6 % (ref 36.0–46.0)
Hemoglobin: 14.9 g/dL (ref 12.0–15.0)
MCH: 31.2 pg (ref 26.0–34.0)
MCHC: 35 g/dL (ref 30.0–36.0)
MCV: 89.1 fL (ref 80.0–100.0)
Platelets: 193 10*3/uL (ref 150–400)
RBC: 4.78 MIL/uL (ref 3.87–5.11)
RDW: 12.3 % (ref 11.5–15.5)
WBC: 7.2 10*3/uL (ref 4.0–10.5)
nRBC: 0 % (ref 0.0–0.2)

## 2020-05-05 LAB — COMPREHENSIVE METABOLIC PANEL
ALT: 54 U/L — ABNORMAL HIGH (ref 0–44)
AST: 45 U/L — ABNORMAL HIGH (ref 15–41)
Albumin: 4.4 g/dL (ref 3.5–5.0)
Alkaline Phosphatase: 49 U/L (ref 38–126)
Anion gap: 11 (ref 5–15)
BUN: 15 mg/dL (ref 6–20)
CO2: 22 mmol/L (ref 22–32)
Calcium: 9.5 mg/dL (ref 8.9–10.3)
Chloride: 105 mmol/L (ref 98–111)
Creatinine, Ser: 0.76 mg/dL (ref 0.44–1.00)
GFR, Estimated: >60 mL/min (ref >60)
Glucose, Bld: 99 mg/dL (ref 70–99)
Potassium: 4 mmol/L (ref 3.5–5.1)
Sodium: 138 mmol/L (ref 135–145)
Total Bilirubin: 1.1 mg/dL (ref 0.3–1.2)
Total Protein: 8.3 g/dL — ABNORMAL HIGH (ref 6.5–8.1)

## 2020-05-05 MED ORDER — AMLODIPINE BESYLATE 5 MG PO TABS: 5.0000 mg | ORAL_TABLET | Freq: Every day | ORAL | 11 refills | Status: DC

## 2020-05-05 NOTE — Telephone Encounter (Signed)
Patient reports a BP of 135/105, 152/102 and 149/106. Today 156/115 inquiring if she needs to get this checked out, she had her second Covid vaccine 05/03/20. Cb# [phone]

## 2020-05-05 NOTE — ED Triage Notes (Signed)
Pt to ED via POV with c/o HTN at home, pt states diastolic at home reading over 100 x 2 days in a row. Pt states intermittent "weird HA" to L side, pt denies HA at this time. Pt states had Covid vaccine on Sunday. Pt A&O x4, NAD noted at this time.

## 2020-05-05 NOTE — Telephone Encounter (Signed)
Spoke w/patient advised to call PCP or go to the ED. Patient stated she will go to the ED.

## 2020-05-05 NOTE — ED Provider Notes (Signed)
[facility] Emergency Department Provider Note  ____________________________________________   First MD Initiated Contact with Patient 05/05/20 1321     (approximate)  I have reviewed the triage vital signs and the nursing notes.   HISTORY  Chief Complaint Headache and Hypertension   HPI Andrea Jones is a 59 y.o. female with a past medical history of HDL, MGUS, and benign heart murmur who presents for assessment of on and off frontal headache associate with some elevated blood pressure she got home.  No prior similar episodes or history of hypertension.  Patient denies any falls or recent injuries.  She states she currently does not have a headache.  Denies any vertigo, vision changes, chest pain, cough, shortness of breath abdominal pain, nausea, vomiting, diarrhea, dysuria, rash, or focal extremity weakness numbness or tingling.  Denies tobacco abuse EtOH or illicit drugs.  He does drink caffeine daily.  No other acute concerns at this time.         Past Medical History:  Diagnosis Date  . Family history of ovarian cancer    8/19 and 9/21 cancer genetic testing letter sent  . Heart murmur    hx of per pt, "hasn't presented itself in years"  . Hot flashes    "pretty much gone"  . Hypercholesteremia   . Hyperlipidemia   . Insomnia   . MGUS (monoclonal gammopathy of unknown significance)    sees Dr. Tasia Catchings with hematology  . Vitamin D deficiency 2016    Patient Active Problem List   Diagnosis Date Noted  . Small fiber neuropathy 04/22/2019  . Atypical squamous cells cannot exclude high grade squamous intraepithelial lesion on cytologic smear of cervix ([facility]) 01/19/2018  . Heme positive stool 08/27/2014    Past Surgical History:  Procedure Laterality Date  . ABDOMINAL HYSTERECTOMY  2001  . APPENDECTOMY  1982  . BREAST BIOPSY Left 1999   cyst,benign  . BREAST BIOPSY Left 03/15/2019   [name] bx venus clip radial scar  . CESAREAN SECTION   1985  . COLONOSCOPY  06/13/2012   [facility]. Normal Exam.  . HYSTERECTOMY ABDOMINAL WITH SALPINGECTOMY    . mgus    . OOPHORECTOMY    . OVARY SURGERY  2000    Prior to Admission medications   Medication Sig Start Date End Date Taking? Authorizing Provider  amLODipine (NORVASC) 5 MG tablet Take 1 tablet (5 mg total) by mouth daily. 05/05/20 05/05/21  Lucrezia Starch, MD  Biotin 10000 MCG TABS Take 1 each by mouth daily.    [provider]  CALCIUM PO Take 500 mg by mouth daily.    [provider]  Cholecalciferol (VITAMIN D3 PO) Take 1,000 Int'l Units by mouth daily.    [provider]  FIBER PO Take by mouth. Dietary fiber- 6 grams soluble fiber- 6 grams Chromium Picolinate- 200 mcg    [provider]  Omega-3 Fatty Acids (OMEGA 3 PO) Take 1,200 mg by mouth daily.    [provider]  OVER THE COUNTER MEDICATION daily. IMMUNE PLUS: Vitamin C- 180 mg  Vitamin D- 25 mcg Zinc- 5 mg    [provider]    Allergies Patient has no known allergies.  Family History  Problem Relation Age of Onset  . Colon cancer Father 73  . Heart disease Father   . Congestive Heart Failure Mother   . Emphysema Mother   . Hypertension Mother   . Kidney failure Mother   . Neuropathy Mother   .  Ovarian cancer Sister 15       pat 1/2 sister  . Breast cancer Cousin 5    Social History Social History   Tobacco Use  . Smoking status: Never Smoker  . Smokeless tobacco: Never Used  Vaping Use  . Vaping Use: Never used  Substance Use Topics  . Alcohol use: No    Alcohol/week: 0.0 standard drinks  . Drug use: No    Review of Systems  Review of Systems  Constitutional: Negative for chills and fever.  HENT: Negative for sore throat.   Eyes: Negative for pain.  Respiratory: Negative for cough and stridor.   Cardiovascular: Negative for chest pain.  Gastrointestinal: Negative for vomiting.  Genitourinary: Negative for dysuria.    Musculoskeletal: Negative for myalgias.  Skin: Negative for rash.  Neurological: Positive for headaches. Negative for seizures and loss of consciousness.  Psychiatric/Behavioral: Negative for suicidal ideas.  All other systems reviewed and are negative.     ____________________________________________   PHYSICAL EXAM:  VITAL SIGNS: ED Triage Vitals [05/05/20 1244]  Enc Vitals Group     BP (!) 144/79     Pulse Rate 82     Resp 18     Temp 98.3 F (36.8 C)     Temp Source Oral     SpO2 96 %     Weight 175 lb (79.4 kg)     Height 5\' 2"  (1.575 m)     Head Circumference      Peak Flow      Pain Score 0     Pain Loc      Pain Edu?      Excl. in [place]?    Vitals:   05/05/20 1244  BP: (!) 144/79  Pulse: 82  Resp: 18  Temp: 98.3 F (36.8 C)  SpO2: 96%   Physical Exam Vitals and nursing note reviewed.  Constitutional:      General: She is not in acute distress.    Appearance: She is well-developed.  HENT:     Head: Normocephalic and atraumatic.     Right Ear: External ear normal.     Left Ear: External ear normal.     Nose: Nose normal.     Mouth/Throat:     Mouth: Mucous membranes are moist.  Eyes:     Conjunctiva/sclera: Conjunctivae normal.  Cardiovascular:     Rate and Rhythm: Normal rate and regular rhythm.     Heart sounds: No murmur heard.   Pulmonary:     Effort: Pulmonary effort is normal. No respiratory distress.     Breath sounds: Normal breath sounds.  Abdominal:     Palpations: Abdomen is soft.     Tenderness: There is no abdominal tenderness.  Musculoskeletal:     Cervical back: Neck supple.     Right lower leg: No edema.     Left lower leg: No edema.  Skin:    General: Skin is warm and dry.     Capillary Refill: Capillary refill takes less than 2 seconds.  Neurological:     Mental Status: She is alert and oriented to person, place, and time.  Psychiatric:        Mood and Affect: Mood normal.     Cranial nerves II through XII grossly  intact.  No pronator drift.  No finger dysmetria.  Symmetric 5/5 strength of all extremities.  Sensation intact to light touch in all extremities.  Unremarkable unassisted gait. ____________________________________________   LABS (all labs ordered  are listed, but only abnormal results are displayed)  Labs Reviewed  COMPREHENSIVE METABOLIC PANEL - Abnormal; Notable for the following components:      Result Value   Total Protein 8.3 (*)    AST 45 (*)    ALT 54 (*)    All other components within normal limits  CBC   ____________________________________________  EKG  Sinus rhythm with a ventricular rate of 79 and nonspecific low amplitude QRS and aVL without other clear evidence of acute ischemia.  Normal axis and intervals. ____________________________________________  RADIOLOGY  ED MD interpretation: No acute intracranial process  Official radiology report(s): CT Head Wo Contrast  Result Date: 05/05/2020 CLINICAL DATA:  Onset right frontal headache this morning. EXAM: CT HEAD WITHOUT CONTRAST TECHNIQUE: Contiguous axial images were obtained from the base of the skull through the vertex without intravenous contrast. COMPARISON:  None. FINDINGS: Brain: No evidence of acute infarction, hemorrhage, hydrocephalus, extra-axial collection or mass lesion/mass effect. Vascular: No hyperdense vessel or unexpected calcification. Skull: Intact.  No focal lesion. Sinuses/Orbits: Negative. Other: None. IMPRESSION: Normal head CT. Electronically Signed   By: Inge Rise M.D.   On: 05/05/2020 13:53    ____________________________________________   PROCEDURES  Procedure(s) performed (including Critical Care):  Procedures   ____________________________________________   INITIAL IMPRESSION / ASSESSMENT AND PLAN / ED COURSE      Patient presents for assessment of some intermittent headaches for last 2 days associate with elevated blood pressures measured at home.  Patient states her  systolics have been in the 423N diastolics in the 361W to 431V.  She states she currently is not have any headache and feels little better now but was worried because she has never had high blood pressure before.  Overall impression is likely undiagnosed hypertension.  No evidence of CVA on history exam or CT head.  EKG does not show evidence of acute ischemia.  Kidney function is normal and CBC and CMP otherwise unremarkable.  Otherwise very low suspicion for dissection or other organ injury related to elevated blood pressures.  Given patient has no symptoms at this time low suspicion for hypertensive urgency.  Advised patient to decrease her caffeine intake and Rx was written felodipine.  Advised patient to follow-up with her PCP on a close outpatient basis.  Discharge stable condition.  Return precautions advised discussed   ____________________________________________   FINAL CLINICAL IMPRESSION(S) / ED DIAGNOSES  Final diagnoses:  Hypertension, unspecified type    Medications - No data to display   ED Discharge Orders         Ordered    amLODipine (NORVASC) 5 MG tablet  Daily        05/05/20 1357           Note:  This document was prepared using Dragon voice recognition software and may include unintentional dictation errors.   Lucrezia Starch, MD 05/05/20 [phone]

## 2020-05-28 ENCOUNTER — Ambulatory Visit: Payer: Self-pay | Admitting: General Surgery

## 2020-05-28 DIAGNOSIS — N6022 Fibroadenosis of left breast: Secondary | ICD-10-CM

## 2020-06-02 ENCOUNTER — Other Ambulatory Visit: Payer: Self-pay | Admitting: General Surgery

## 2020-06-02 DIAGNOSIS — N6022 Fibroadenosis of left breast: Secondary | ICD-10-CM

## 2020-06-09 ENCOUNTER — Ambulatory Visit (INDEPENDENT_AMBULATORY_CARE_PROVIDER_SITE_OTHER): Payer: 59 | Admitting: Family Medicine

## 2020-06-09 ENCOUNTER — Encounter (INDEPENDENT_AMBULATORY_CARE_PROVIDER_SITE_OTHER): Payer: Self-pay | Admitting: Family Medicine

## 2020-06-09 ENCOUNTER — Other Ambulatory Visit: Payer: Self-pay

## 2020-06-09 VITALS — BP 124/64 | HR 66 | Temp 98.3°F | Ht 62.0 in | Wt 183.0 lb

## 2020-06-09 DIAGNOSIS — I10 Essential (primary) hypertension: Secondary | ICD-10-CM

## 2020-06-09 DIAGNOSIS — E559 Vitamin D deficiency, unspecified: Secondary | ICD-10-CM | POA: Insufficient documentation

## 2020-06-09 DIAGNOSIS — Z6833 Body mass index (BMI) 33.0-33.9, adult: Secondary | ICD-10-CM

## 2020-06-09 DIAGNOSIS — K5909 Other constipation: Secondary | ICD-10-CM

## 2020-06-09 DIAGNOSIS — R0602 Shortness of breath: Secondary | ICD-10-CM | POA: Diagnosis not present

## 2020-06-09 DIAGNOSIS — E7849 Other hyperlipidemia: Secondary | ICD-10-CM

## 2020-06-09 DIAGNOSIS — R5383 Other fatigue: Secondary | ICD-10-CM | POA: Insufficient documentation

## 2020-06-09 DIAGNOSIS — Z9189 Other specified personal risk factors, not elsewhere classified: Secondary | ICD-10-CM

## 2020-06-09 DIAGNOSIS — E669 Obesity, unspecified: Secondary | ICD-10-CM

## 2020-06-09 DIAGNOSIS — Z1331 Encounter for screening for depression: Secondary | ICD-10-CM

## 2020-06-09 DIAGNOSIS — Z0289 Encounter for other administrative examinations: Secondary | ICD-10-CM

## 2020-06-09 DIAGNOSIS — F411 Generalized anxiety disorder: Secondary | ICD-10-CM

## 2020-06-09 MED ORDER — AMLODIPINE BESYLATE 10 MG PO TABS: 10.0000 mg | ORAL_TABLET | Freq: Every day | ORAL | Status: DC

## 2020-06-10 LAB — COMPREHENSIVE METABOLIC PANEL
ALT: 47 IU/L — ABNORMAL HIGH (ref 0–32)
AST: 30 IU/L (ref 0–40)
Albumin/Globulin Ratio: 1.5 (ref 1.2–2.2)
Albumin: 4.6 g/dL (ref 3.8–4.9)
Alkaline Phosphatase: 60 IU/L (ref 44–121)
BUN/Creatinine Ratio: 13 (ref 9–23)
BUN: 12 mg/dL (ref 6–24)
Bilirubin Total: 0.5 mg/dL (ref 0.0–1.2)
CO2: 23 mmol/L (ref 20–29)
Calcium: 10 mg/dL (ref 8.7–10.2)
Chloride: 102 mmol/L (ref 96–106)
Creatinine, Ser: 0.94 mg/dL (ref 0.57–1.00)
GFR calc Af Amer: 77 mL/min/{1.73_m2} (ref >59)
GFR calc non Af Amer: 67 mL/min/{1.73_m2} (ref >59)
Globulin, Total: 3.1 g/dL (ref 1.5–4.5)
Glucose: 94 mg/dL (ref 65–99)
Potassium: 4 mmol/L (ref 3.5–5.2)
Sodium: 140 mmol/L (ref 134–144)
Total Protein: 7.7 g/dL (ref 6.0–8.5)

## 2020-06-10 LAB — CBC WITH DIFFERENTIAL/PLATELET
Basophils Absolute: 0.1 10*3/uL (ref 0.0–0.2)
Basos: 1 %
EOS (ABSOLUTE): 0.2 10*3/uL (ref 0.0–0.4)
Eos: 3 %
Hematocrit: 42.7 % (ref 34.0–46.6)
Hemoglobin: 14.8 g/dL (ref 11.1–15.9)
Immature Grans (Abs): 0 10*3/uL (ref 0.0–0.1)
Immature Granulocytes: 0 %
Lymphocytes Absolute: 2.1 10*3/uL (ref 0.7–3.1)
Lymphs: 31 %
MCH: 31 pg (ref 26.6–33.0)
MCHC: 34.7 g/dL (ref 31.5–35.7)
MCV: 90 fL (ref 79–97)
Monocytes Absolute: 0.6 10*3/uL (ref 0.1–0.9)
Monocytes: 9 %
Neutrophils Absolute: 3.8 10*3/uL (ref 1.4–7.0)
Neutrophils: 56 %
Platelets: 225 10*3/uL (ref 150–450)
RBC: 4.77 x10E6/uL (ref 3.77–5.28)
RDW: 12.6 % (ref 11.7–15.4)
WBC: 6.7 10*3/uL (ref 3.4–10.8)

## 2020-06-10 LAB — FOLATE: Folate: 14.4 ng/mL (ref >3.0)

## 2020-06-10 LAB — LIPID PANEL
Chol/HDL Ratio: 7 ratio — ABNORMAL HIGH (ref 0.0–4.4)
Cholesterol, Total: 278 mg/dL — ABNORMAL HIGH (ref 100–199)
HDL: 40 mg/dL (ref >39)
LDL Chol Calc (NIH): 198 mg/dL — ABNORMAL HIGH (ref 0–99)
Triglycerides: 205 mg/dL — ABNORMAL HIGH (ref 0–149)
VLDL Cholesterol Cal: 40 mg/dL (ref 5–40)

## 2020-06-10 LAB — VITAMIN D 25 HYDROXY (VIT D DEFICIENCY, FRACTURES): Vit D, 25-Hydroxy: 23.3 ng/mL — ABNORMAL LOW (ref 30.0–100.0)

## 2020-06-10 LAB — HEMOGLOBIN A1C
Est. average glucose Bld gHb Est-mCnc: 111 mg/dL
Hgb A1c MFr Bld: 5.5 % (ref 4.8–5.6)

## 2020-06-10 LAB — TSH: TSH: 1.61 u[IU]/mL (ref 0.450–4.500)

## 2020-06-10 LAB — VITAMIN B12: Vitamin B-12: 1206 pg/mL (ref 232–1245)

## 2020-06-10 LAB — T4, FREE: Free T4: 1.19 ng/dL (ref 0.82–1.77)

## 2020-06-10 LAB — INSULIN, RANDOM: INSULIN: 21.1 u[IU]/mL (ref 2.6–24.9)

## 2020-06-10 NOTE — Progress Notes (Signed)
Chief Complaint:   OBESITY Andrea Jones (MR# [id number]) is a 60 y.o. female who presents for evaluation and treatment of obesity and related comorbidities. Current BMI is Body mass index is 33.47 kg/m. Andrea Jones has been struggling with her weight for many years and has been unsuccessful in either losing weight, maintaining weight loss, or reaching her healthy weight goal.  Andrea Jones is currently in the action stage of change and ready to dedicate time achieving and maintaining a healthier weight. Andrea Jones is interested in becoming our patient and working on intensive lifestyle modifications including (but not limited to) diet and exercise for weight loss.  Andrea Jones lives with her husband, Marcello Moores, and works full-time from home. She craves food in the afternoon and evenings; usually craves chocolate and sweets. Andrea Jones states that she dislikes yogurt, cottage cheese, milk but is willing to try them. Andrea Jones is currently walks 15 minutes 3 days a week on average. She usually gets 3,000 steps a day.  Andrea Jones's habits were reviewed today and are as follows: Her family eats meals together, she thinks her family will eat healthier with her, she struggles with family and or coworkers weight loss sabotage, her desired weight loss is 53 lbs, she started gaining weight in the last 10 years, her heaviest weight ever was 186.2 lbs pounds, she has significant food cravings issues, she snacks frequently in the evenings, she skips meals frequently, she is frequently drinking liquids with calories, she frequently makes poor food choices, she frequently eats larger portions than normal and she struggles with emotional eating.  This is the patient's first visit at [facility].  The patient's NEW PATIENT PACKET that they filled out prior to today's office visit was reviewed at length and information from that paperwork was included within the following office visit note.    Included in the packet:  current and past health history, medications, allergies, ROS, gynecologic history (women only), surgical history, family history, social history, weight history, weight loss surgery history (for those that have had weight loss surgery), nutritional evaluation, mood and food questionnaire along with a depression screening (PHQ9) on all patients, an Epworth questionnaire, sleep habits questionnaire, patient life and health improvement goals questionnaire. These will all be scanned into the patient's chart under media.   During the visit, I independently reviewed the patient's EKG, bioimpedance scale results, and indirect calorimeter results. I used this information to tailor a meal plan for the patient that will help Andrea Jones to lose weight and will improve her obesity-related conditions going forward.  I performed a medically necessary appropriate examination and/or evaluation. I discussed the assessment and treatment plan with the patient. The patient was provided an opportunity to ask questions and all were answered. The patient agreed with the plan and demonstrated an understanding of the instructions. Labs were ordered today (unless patient declined them) and will be reviewed with the patient at our next visit unless more critical results need to be addressed immediately. Clinical information was updated and documented in the EMR.  Time spent on visit including pre-visit chart review and post-visit care was estimated to be 70 minutes.      Depression Screen Nirali's Food and Mood (modified PHQ-9) score was 15.  Depression screen [facility] 2/9 06/09/2020  Decreased Interest 3  Down, Depressed, Hopeless 1  PHQ - 2 Score 4  Altered sleeping 3  Tired, decreased energy 3  Change in appetite 1  Feeling bad or failure about yourself  2  Trouble concentrating 1  Moving slowly or fidgety/restless 1  Suicidal thoughts 0  PHQ-9 Score 15  Difficult doing work/chores Somewhat difficult    Assessment/Plan:    1. Other fatigue Andrea Jones admits to daytime somnolence and admits to waking up still tired. Patent has a history of symptoms of daytime fatigue. Andrea Jones generally gets 5 hours of sleep per night, and states that she has difficulty falling asleep. Snoring is present. Apneic episodes are not present. Epworth Sleepiness Score is 10.  Plan: Andrea Jones does feel that her weight is causing her energy to be lower than it should be. Fatigue may be related to obesity, depression or many other causes. Labs will be ordered, and in the meanwhile, Andrea Jones will focus on self care including making healthy food choices, increasing physical activity and focusing on stress reduction. Check labs today.  Orders - EKG 12-Lead - Vitamin B12 - CBC with Differential/Platelet - Comprehensive metabolic panel - Folate - Hemoglobin A1c - Insulin, random - Lipid panel - T4, free - TSH - VITAMIN D 25 Hydroxy (Vit-D Deficiency, Fractures)  2. SOB (shortness of breath) on exertion Andrea Jones notes increasing shortness of breath with exercising and seems to be worsening over time with weight gain. She notes getting out of breath sooner with activity than she used to. This has not gotten worse recently. Andrea Jones denies shortness of breath at rest or orthopnea.  Plan: Andrea Jones does feel that she gets out of breath more easily that she used to when she exercises. Andrea Jones shortness of breath appears to be obesity related and exercise induced. She has agreed to work on weight loss and gradually increase exercise to treat her exercise induced shortness of breath. Will continue to monitor closely. Check labs today.  Orders - Vitamin B12 - CBC with Differential/Platelet - Comprehensive metabolic panel - Folate - Hemoglobin A1c - Insulin, random - Lipid panel - T4, free - TSH - VITAMIN D 25 Hydroxy (Vit-D Deficiency, Fractures)  3. Other hyperlipidemia Andrea Jones has hyperlipidemia and has been trying to improve her cholesterol levels  with intensive lifestyle modification including a low saturated fat diet, exercise and weight loss. She denies any chest pain, claudication or myalgias. Andrea Jones is not on medication, as HLD is diet controlled.  Plan: Cardiovascular risk and specific lipid/LDL goals reviewed.  We discussed several lifestyle modifications today and Andrea Jones will continue to work on diet, exercise and weight loss efforts. Orders and follow up as documented in patient record. Check labs today.  Counseling Intensive lifestyle modifications are the first line treatment for this issue.  Dietary changes: Increase soluble fiber. Decrease simple carbohydrates.  Exercise changes: Moderate to vigorous-intensity aerobic activity 150 minutes per week if tolerated.  Lipid-lowering medications: see documented in medical record.   Orders - Vitamin B12 - CBC with Differential/Platelet - Comprehensive metabolic panel - Folate - Hemoglobin A1c - Insulin, random - Lipid panel - T4, free - TSH - VITAMIN D 25 Hydroxy (Vit-D Deficiency, Fractures)  4. Other constipation Andrea Jones notes constipation. She takes OTC medication as needed.   Plan: Andrea Jones was informed that a decrease in bowel movement frequency is normal while losing weight, but stools should not be hard or painful. Orders and follow up as documented in patient record.   Counseling Getting to Good Bowel Health: Your goal is to have one soft bowel movement each day. Drink at least 8 glasses of water each day. Eat plenty of fiber (goal is over 25 grams each day). It is best to get most  of your fiber from dietary sources which includes leafy green vegetables, fresh fruit, and whole grains. You may need to add fiber with the help of OTC fiber supplements. These include Metamucil, Citrucel, and Flaxseed. If you are still having trouble, try adding Miralax or Magnesium Citrate. If all of these changes do not work, Cabin [employer].   5. GAD (generalized anxiety  disorder) with emotional eating Andrea Jones is currently not on medication. Mood= PHQ9= 15. She does eat as a reward and to help comfort herself with guilty feelings after symptoms. Pretty well controlled at current.  Plan: Check labs today. Emotional eating strategies will be examined as we continue her weight loss journey.  6. Essential hypertension Andrea Jones was recently started on Norvasc by new PCP at [facility]. Approximately 3 weeks ago She increased the dose from 5 mg to 10 mg. Andrea Jones notes minimal swelling in her ankles with the increased dose.   Plan: Blood pressure at goal. Lyndel has a follow up with her PCP I 1 week. Told to keep it and we will closely monitor patients blood pressure alongside PCP. Check labs today.  7. Vitamin D deficiency Andrea Jones is currently taking OTC vitamin D 1,000 units each day. She denies nausea, vomiting or muscle weakness.  Plan: Low Vitamin D level contributes to fatigue and are associated with obesity, breast, and colon cancer. She agrees to continue to take prescription Vitamin D @50 ,000 IU every week and will follow-up for routine testing of Vitamin D, at least 2-3 times per year to avoid over-replacement.  Orders - VITAMIN D 25 Hydroxy (Vit-D Deficiency, Fractures)  8. Depression screening  Andrea Jones had a positive depression screening. Depression is commonly associated with obesity and often results in emotional eating behaviors. We will monitor this closely and work on CBT to help improve the non-hunger eating patterns. Referral to Psychology may be required if no improvement is seen as she continues in our clinic.   9. At risk for impaired metabolic function Andrea Jones was given approximately 30 minutes of impaired  metabolic function prevention counseling today. We discussed intensive lifestyle modifications today with an emphasis on specific nutrition and exercise instructions and strategies.   10. Class 1 obesity with serious comorbidity and body mass index  (BMI) of 33.0 to 33.9 in adult, unspecified obesity type Andrea Jones is currently in the action stage of change and her goal is to continue with weight loss efforts. I recommend Andrea Jones begin the structured treatment plan as follows:  She has agreed to the Category 3 Plan.  Exercise goals: As is   Behavioral modification strategies: no skipping meals, meal planning and cooking strategies and planning for success.  She was informed of the importance of frequent follow-up visits to maximize her success with intensive lifestyle modifications for her multiple health conditions. She was informed we would discuss her lab results at her next visit unless there is a critical issue that needs to be addressed sooner. Andrea Jones agreed to keep her next visit at the agreed upon time to discuss these results.  Objective:   Blood pressure 124/64, pulse 66, temperature 98.3 F (36.8 C), height 5\' 2"  (1.575 m), weight 183 lb (83 kg), SpO2 97 %. Body mass index is 33.47 kg/m.  EKG: Normal sinus rhythm, rate 68.  Indirect Calorimeter completed today shows a VO2 of 276 and a REE of 1919.  Her calculated basal metabolic rate is [id number] thus her basal metabolic rate is better than expected.  General: Cooperative, alert, well developed, in no  acute distress. HEENT: Conjunctivae and lids unremarkable. Cardiovascular: Regular rhythm.  Lungs: Normal work of breathing. Neurologic: No focal deficits.   Lab Results  Component Value Date   CREATININE 0.94 06/09/2020   BUN 12 06/09/2020   NA 140 06/09/2020   K 4.0 06/09/2020   CL 102 06/09/2020   CO2 23 06/09/2020   Lab Results  Component Value Date   ALT 47 (H) 06/09/2020   AST 30 06/09/2020   ALKPHOS 60 06/09/2020   BILITOT 0.5 06/09/2020   Lab Results  Component Value Date   HGBA1C 5.5 06/09/2020   HGBA1C 5.5 02/25/2019   Lab Results  Component Value Date   INSULIN 21.1 06/09/2020   Lab Results  Component Value Date   TSH 1.610 06/09/2020   Lab  Results  Component Value Date   CHOL 278 (H) 06/09/2020   HDL 40 06/09/2020   LDLCALC 198 (H) 06/09/2020   TRIG 205 (H) 06/09/2020   CHOLHDL 7.0 (H) 06/09/2020   Lab Results  Component Value Date   WBC 6.7 06/09/2020   HGB 14.8 06/09/2020   HCT 42.7 06/09/2020   MCV 90 06/09/2020   PLT 225 06/09/2020   No results found for: IRON, TIBC, FERRITIN  Attestation Statements:   Reviewed by clinician on day of visit: allergies, medications, problem list, medical history, surgical history, family history, social history, and previous encounter notes.  Edmund Hilda, am acting as Energy manager for [employer], DO.  I have reviewed the above documentation for accuracy and completeness, and I agree with the above. Carlye Grippe, D.O.  The 21st Century Cures Act was signed into law in 2016 which includes the topic of electronic health records.  This provides immediate access to information in MyChart.  This includes consultation notes, operative notes, office notes, lab results and pathology reports.  If you have any questions about what you read please let us know at your next visit so we can discuss your concerns and take corrective action if need be.  We are right here with you.

## 2020-06-23 ENCOUNTER — Encounter (INDEPENDENT_AMBULATORY_CARE_PROVIDER_SITE_OTHER): Payer: Self-pay | Admitting: Family Medicine

## 2020-06-23 ENCOUNTER — Other Ambulatory Visit (INDEPENDENT_AMBULATORY_CARE_PROVIDER_SITE_OTHER): Payer: Self-pay | Admitting: Family Medicine

## 2020-06-23 ENCOUNTER — Telehealth (INDEPENDENT_AMBULATORY_CARE_PROVIDER_SITE_OTHER): Payer: 59 | Admitting: Family Medicine

## 2020-06-23 ENCOUNTER — Other Ambulatory Visit: Payer: Self-pay

## 2020-06-23 ENCOUNTER — Encounter (INDEPENDENT_AMBULATORY_CARE_PROVIDER_SITE_OTHER): Payer: Self-pay

## 2020-06-23 ENCOUNTER — Telehealth (INDEPENDENT_AMBULATORY_CARE_PROVIDER_SITE_OTHER): Payer: Self-pay

## 2020-06-23 DIAGNOSIS — E8881 Metabolic syndrome: Secondary | ICD-10-CM | POA: Diagnosis not present

## 2020-06-23 DIAGNOSIS — I10 Essential (primary) hypertension: Secondary | ICD-10-CM

## 2020-06-23 DIAGNOSIS — E7849 Other hyperlipidemia: Secondary | ICD-10-CM

## 2020-06-23 DIAGNOSIS — Z6833 Body mass index (BMI) 33.0-33.9, adult: Secondary | ICD-10-CM

## 2020-06-23 DIAGNOSIS — E669 Obesity, unspecified: Secondary | ICD-10-CM

## 2020-06-23 DIAGNOSIS — Z9189 Other specified personal risk factors, not elsewhere classified: Secondary | ICD-10-CM | POA: Diagnosis not present

## 2020-06-23 DIAGNOSIS — R7401 Elevation of levels of liver transaminase levels: Secondary | ICD-10-CM

## 2020-06-23 DIAGNOSIS — E559 Vitamin D deficiency, unspecified: Secondary | ICD-10-CM | POA: Diagnosis not present

## 2020-06-23 MED ORDER — VITAMIN D (ERGOCALCIFEROL) 1.25 MG (50000 UNIT) PO CAPS: 50000.0000 [IU] | ORAL_CAPSULE | ORAL | 0 refills | Status: DC

## 2020-06-23 NOTE — Patient Instructions (Signed)
The 10-year ASCVD risk score Mikey Bussing DC Brooke Bonito., et al., 2013) is: 4.9%   Values used to calculate the score:     Age: 60 years     Sex: Female     Is Non-Hispanic African American: No     Diabetic: No     Tobacco smoker: No     Systolic Blood Pressure: 185 mmHg     Is BP treated: Yes     HDL Cholesterol: 40 mg/dL     Total Cholesterol: 278 mg/dL

## 2020-06-23 NOTE — Telephone Encounter (Signed)
Verbal consent obtained to conduct video visit, via telehealth 

## 2020-06-24 NOTE — Progress Notes (Signed)
TeleHealth Visit:  Due to the COVID-19 pandemic, this visit was completed with telemedicine (audio/video) technology to reduce patient and provider exposure as well as to preserve personal protective equipment.   Andrea Jones has verbally consented to this TeleHealth visit. The patient is located at home, the provider is located at the [facility] office. The participants in this visit include the listed provider and patient. The visit was conducted today via video.  Chief Complaint: OBESITY Andrea Jones is here to discuss her progress with her obesity treatment plan along with follow-up of her obesity related diagnoses. Andrea Jones is on the Category 3 Plan and states Andrea Jones is following her eating plan approximately 90% of the time. Andrea Jones states Andrea Jones is walking 20 minutes 3 times per week.  Today's visit was #: 2 Starting weight: 183 lbs Starting date: 06/09/2020  Interim History: Today is Andrea Jones's first follow up. "It's a lot of food. I'm not hungry at all. I don't like yogurt or cottage cheese. I did drink milk though" (not [employer]). Patient without concerns except questions regarding options for snack and questions about frozen meals.  Assessment/Plan:   1. Essential hypertension Discussed labs with patient today. Andrea Jones is not checking her blood pressure at home. Andrea Jones is prescribed Norvasc. Andrea Jones has no symptoms or concerns. Review: taking medications as instructed, no medication side effects noted, no chest pain on exertion, no dyspnea on exertion, no swelling of ankles.  BP Readings from Last 3 Encounters:  06/09/20 124/64  05/05/20 (!) 144/79  04/03/20 135/79   Lab Results  Component Value Date   WBC 6.7 06/09/2020   HGB 14.8 06/09/2020   HCT 42.7 06/09/2020   MCV 90 06/09/2020   PLT 225 06/09/2020     Chemistry      Component Value Date/Time   NA 140 06/09/2020 0856   K 4.0 06/09/2020 0856   CL 102 06/09/2020 0856   CO2 23 06/09/2020 0856   BUN 12 06/09/2020 0856    CREATININE 0.94 06/09/2020 0856      Component Value Date/Time   CALCIUM 10.0 06/09/2020 0856   ALKPHOS 60 06/09/2020 0856   AST 30 06/09/2020 0856   ALT 47 (H) 06/09/2020 0856   BILITOT 0.5 06/09/2020 0856     Plan: Recommend home blood pressure monitoring 2-3 days a week. Continue current treatment plan. Prudent nutritional plan, decrease salt, increase exercise and low weight to improve BP. Andrea Jones is working on healthy weight loss and exercise to improve blood pressure control. We will watch for signs of hypotension as Andrea Jones continues her lifestyle modifications.  2. Other hyperlipidemia Worsening. Discussed labs with patient today. Andrea Jones has no history of statin use. Andrea Jones is taking fish oil. Patient denies myalgias.   Lab Results  Component Value Date   CHOL 278 (H) 06/09/2020   HDL 40 06/09/2020   LDLCALC 198 (H) 06/09/2020   TRIG 205 (H) 06/09/2020   CHOLHDL 7.0 (H) 06/09/2020   Lab Results  Component Value Date   ALT 47 (H) 06/09/2020   AST 30 06/09/2020   ALKPHOS 60 06/09/2020   BILITOT 0.5 06/09/2020   The 10-year ASCVD risk score Andrea Jones DC Jr., et al., 2013) is: 4.9%   Values used to calculate the score:     Age: 60 years     Sex: Female     Is Non-Hispanic African American: No     Diabetic: No     Tobacco smoker: No     Systolic Blood Pressure: 725  mmHg     Is BP treated: Yes     HDL Cholesterol: 40 mg/dL     Total Cholesterol: 278 mg/dL  Plan: Andrea Jones is borderline criteria for starting statin therapy. Advised prudent nutritional plan, weight loss via exercise and decrease saturated and trans fats. Follow up with PCP regarding their thoughts on cholesterol levels and if meds ar needed or not. Cardiovascular risk and specific lipid/LDL goals reviewed.  We discussed several lifestyle modifications today and Andrea Jones will continue to work on diet, exercise and weight loss efforts. Orders and follow up as documented in patient record.   Counseling Intensive lifestyle  modifications are the first line treatment for this issue. . Dietary changes: Increase soluble fiber. Decrease simple carbohydrates. . Exercise changes: Moderate to vigorous-intensity aerobic activity 150 minutes per week if tolerated. . Lipid-lowering medications: see documented in medical record.  3. Insulin resistance New. Discussed labs with patient today. Andrea Jones is not on medication and has never been told Andrea Jones is insulin resistant in the past.  Andrea Jones has a diagnosis of insulin resistance based on her elevated fasting insulin level >5. Andrea Jones continues to work on diet and exercise to decrease her risk of diabetes.  Lab Results  Component Value Date   INSULIN 21.1 06/09/2020   Lab Results  Component Value Date   HGBA1C 5.5 06/09/2020   Plan: Extensive counseling on disease process. Importance of following meal plan and weight loss stressed to patient. Consider meds in the future as needed. Andrea Jones will continue to work on weight loss, exercise, and decreasing simple carbohydrates to help decrease the risk of diabetes. Andrea Jones agreed to follow-up with [name] as directed to closely monitor her progress.  4. Vitamin D deficiency Discussed labs with patient today. Andrea Jones's Vitamin D level was 23.3 on 06/09/2020. Andrea Jones is currently taking OTC vitamin D 1000 each day. Andrea Jones denies nausea, vomiting or muscle weakness. Patient has never been on prescription Vit D supplement.  Ref. Range 06/09/2020 08:56  Vitamin D, 25-Hydroxy Latest Ref Range: 30.0 - 100.0 ng/mL 23.3 (L)   Plan: Start prescription Vit D. Low Vitamin D level contributes to fatigue and are associated with obesity, breast, and colon cancer. Andrea Jones agrees to start to take prescription Vitamin D @50 ,000 IU every week and will follow-up for routine testing of Vitamin D, at least 2-3 times per year to avoid over-replacement.   Start- Vitamin D, Ergocalciferol, (DRISDOL) 1.25 MG (50000 UNIT) CAPS capsule; Take 1 capsule (50,000 Units total) by mouth  every 7 (seven) days.  Dispense: 4 capsule; Refill: 0  5. Elevated ALT measurement Andrea Jones has history of elevated ALT for several years. Andrea Jones has never had imaging and levels are improved from prior.  Andrea Jones has a new dx of elevated ALT.  Lab Results  Component Value Date   ALT 47 (H) 06/09/2020   AST 30 06/09/2020   ALKPHOS 60 06/09/2020   BILITOT 0.5 06/09/2020   Plan: Counseling on lab ABN meaning performed. Recommend prudent nutritional plan and weight loss. Will continue to monitor as patient loses weight. Treatment plan: weight loss and decrease abdominal adiposity. Increase exercise and muscle strengthening.  6. At risk for diabetes mellitus - Andrea Jones was given extensive diabetes prevention education and counseling today of more than 24 minutes.  - Counseled patient on pathophysiology of disease and discussed various treatment options which always includes dietary and lifestyle modification as first line.   - Importance of healthy diet with very limited amounts of simple carbohydrates discussed with patient in  addition to regular aerobic exercise to an eventual goal of 62min 5d/week or more.  - Handouts provided at patient's desire and or told to go online at the American Diabetes Association website for further information  7. Class 1 obesity with serious comorbidity and body mass index (BMI) of 33.0 to 33.9 in adult, unspecified obesity type Andrea Jones is currently in the action stage of change. As such, her goal is to continue with weight loss efforts. Andrea Jones has agreed to the Category 3 Plan.   Exercise goals: As is  Behavioral modification strategies: increasing lean protein intake, decreasing simple carbohydrates, meal planning and cooking strategies, better snacking choices and planning for success.  Andrea Jones has agreed to follow-up with our clinic in 2 weeks on 07/07/2020 at 1000. Andrea Jones was informed of the importance of frequent follow-up visits to maximize her success with intensive  lifestyle modifications for her multiple health conditions.  Objective:   VITALS: Per patient if applicable, see vitals. GENERAL: Alert and in no acute distress. CARDIOPULMONARY: No increased WOB. Speaking in clear sentences.  PSYCH: Pleasant and cooperative. Speech normal rate and rhythm. Affect is appropriate. Insight and judgement are appropriate. Attention is focused, linear, and appropriate.  NEURO: Oriented as arrived to appointment on time with no prompting.   Lab Results  Component Value Date   CREATININE 0.94 06/09/2020   BUN 12 06/09/2020   NA 140 06/09/2020   K 4.0 06/09/2020   CL 102 06/09/2020   CO2 23 06/09/2020   Lab Results  Component Value Date   ALT 47 (H) 06/09/2020   AST 30 06/09/2020   ALKPHOS 60 06/09/2020   BILITOT 0.5 06/09/2020   Lab Results  Component Value Date   HGBA1C 5.5 06/09/2020   HGBA1C 5.5 02/25/2019   Lab Results  Component Value Date   INSULIN 21.1 06/09/2020   Lab Results  Component Value Date   TSH 1.610 06/09/2020   Lab Results  Component Value Date   CHOL 278 (H) 06/09/2020   HDL 40 06/09/2020   LDLCALC 198 (H) 06/09/2020   TRIG 205 (H) 06/09/2020   CHOLHDL 7.0 (H) 06/09/2020   Lab Results  Component Value Date   WBC 6.7 06/09/2020   HGB 14.8 06/09/2020   HCT 42.7 06/09/2020   MCV 90 06/09/2020   PLT 225 06/09/2020   No results found for: IRON, TIBC, FERRITIN  Attestation Statements:   Reviewed by clinician on day of visit: allergies, medications, problem list, medical history, surgical history, family history, social history, and previous encounter notes.  Coral Ceo, am acting as Location manager for [employer], DO.  I have reviewed the above documentation for accuracy and completeness, and I agree with the above. -  *Marjory Sneddon, D.O.  The [place] was signed into law in 2016 which includes the topic of electronic health records.  This provides immediate access to information  in MyChart.  This includes consultation notes, operative notes, office notes, lab results and pathology reports.  If you have any questions about what you read please let us know at your next visit so we can discuss your concerns and take corrective action if need be.  We are right here with you.

## 2020-07-07 ENCOUNTER — Ambulatory Visit (INDEPENDENT_AMBULATORY_CARE_PROVIDER_SITE_OTHER): Payer: 59 | Admitting: Family Medicine

## 2020-07-07 ENCOUNTER — Encounter (INDEPENDENT_AMBULATORY_CARE_PROVIDER_SITE_OTHER): Payer: Self-pay | Admitting: Family Medicine

## 2020-07-07 ENCOUNTER — Other Ambulatory Visit: Payer: Self-pay

## 2020-07-07 VITALS — BP 128/82 | HR 62 | Temp 97.8°F | Ht 62.0 in | Wt 178.0 lb

## 2020-07-07 DIAGNOSIS — E669 Obesity, unspecified: Secondary | ICD-10-CM | POA: Diagnosis not present

## 2020-07-07 DIAGNOSIS — Z6832 Body mass index (BMI) 32.0-32.9, adult: Secondary | ICD-10-CM

## 2020-07-07 DIAGNOSIS — E559 Vitamin D deficiency, unspecified: Secondary | ICD-10-CM

## 2020-07-07 DIAGNOSIS — Z9189 Other specified personal risk factors, not elsewhere classified: Secondary | ICD-10-CM | POA: Diagnosis not present

## 2020-07-07 DIAGNOSIS — I10 Essential (primary) hypertension: Secondary | ICD-10-CM | POA: Diagnosis not present

## 2020-07-07 MED ORDER — VITAMIN D (ERGOCALCIFEROL) 1.25 MG (50000 UNIT) PO CAPS: 50000.0000 [IU] | ORAL_CAPSULE | ORAL | 0 refills | Status: DC

## 2020-07-08 ENCOUNTER — Other Ambulatory Visit: Payer: Self-pay

## 2020-07-08 ENCOUNTER — Encounter (HOSPITAL_BASED_OUTPATIENT_CLINIC_OR_DEPARTMENT_OTHER): Payer: Self-pay | Admitting: General Surgery

## 2020-07-08 NOTE — Progress Notes (Signed)
Chief Complaint:   OBESITY Andrea Jones is here to discuss her progress with her obesity treatment plan along with follow-up of her obesity related diagnoses.   Today's visit was #: 3 Starting weight: 183 lbs Starting date: 06/09/2020 Today's weight: 178 lbs Today's date: 07/07/2020 Total lbs lost to date: 5 lbs Body mass index is 32.56 kg/m.  Total weight loss percentage to date: -2.73%  Interim History: Andrea Jones says she drinks 24 ounces of water per day.  Andrea Jones is here for a follow up office visit.  she is following the meal plan without concern or issues.  Patient's meal and food recall appears to be accurate and consistent with what is on the plan.  When on plan, her hunger and cravings are well controlled.    Nutrition Plan: the Category 3 Plan for 90% of the time.  Activity: Walking for 20 minutes 3 times per week.  Assessment/Plan:   Medications Discontinued During This Encounter  Medication Reason  . Vitamin D, Ergocalciferol, (DRISDOL) 1.25 MG (50000 UNIT) CAPS capsule Reorder     Meds ordered this encounter  Medications  . Vitamin D, Ergocalciferol, (DRISDOL) 1.25 MG (50000 UNIT) CAPS capsule    Sig: Take 1 capsule (50,000 Units total) by mouth every 7 (seven) days.    Dispense:  4 capsule    Refill:  0     1. Essential hypertension At goal. Medications: Norvasc 10 mg daily.  Blood pressures at home run in the 130s/80s.  Plan: Avoid buying foods that are: processed, frozen, or prepackaged to avoid excess salt. We will continue to monitor symptoms as they relate to her weight loss journey.  BP Readings from Last 3 Encounters:  07/07/20 128/82  06/09/20 124/64  05/05/20 (!) 144/79   Lab Results  Component Value Date   CREATININE 0.94 06/09/2020   2. Vitamin D deficiency Not at goal. Current vitamin D is 23.2, tested on 06/09/2020. Optimal goal > 50 ng/dL.   Plan:  [x]   Continue Vitamin D @50 ,706 IU every week. []   Continue home supplement  daily. [x]   Follow-up for routine testing of Vitamin D at least 2-3 times per year to avoid over-replacement.  - Refill Vitamin D, Ergocalciferol, (DRISDOL) 1.25 MG (50000 UNIT) CAPS capsule; Take 1 capsule (50,000 Units total) by mouth every 7 (seven) days.  Dispense: 4 capsule; Refill: 0  3. At risk for dehydration Andrea Jones is at higher than average risk of dehydration.  Andrea Jones was given more than 9 minutes of proper hydration counseling today.  We discussed the signs and symptoms of dehydration, some of which may include muscle cramping, constipation or even orthostatic symptoms.  Counseling on the prevention of dehydration was also provided today.  Andrea Jones is at risk for dehydration due to weight loss, lifestyle and behavorial habits and possibly due to taking certain medication(s).  She was encouraged to adequately hydrate and monitor fluid status to avoid dehydration as well as weight loss plateaus.  Unless pre-existing renal or cardiopulmonary conditions exist, in which patient was told to limit their fluid intake, I recommended roughly one half of their weight in pounds to be the approximate ounces of non-caloric, non-caffeinated beverages they should drink per day; including more if they are engaging in exercise.  4. Class 1 obesity with serious comorbidity and body mass index (BMI) of 32.0 to 32.9 in adult, unspecified obesity type  Course: Andrea Jones is currently in the action stage of change. As such, her goal is to continue  with weight loss efforts.   Nutrition goals: She has agreed to the Category 3 Plan.   Exercise goals: As is.  Behavioral modification strategies: meal planning and cooking strategies, keeping healthy foods in the home and planning for success.  Andrea Jones has agreed to follow-up with our clinic in 2 weeks. She was informed of the importance of frequent follow-up visits to maximize her success with intensive lifestyle modifications for her multiple health conditions.    Objective:   Blood pressure 128/82, pulse 62, temperature 97.8 F (36.6 C), temperature source Oral, height 5\' 2"  (1.575 m), weight 178 lb (80.7 kg), SpO2 97 %. Body mass index is 32.56 kg/m.  General: Cooperative, alert, well developed, in no acute distress. HEENT: Conjunctivae and lids unremarkable. Cardiovascular: Regular rhythm.  Lungs: Normal work of breathing. Neurologic: No focal deficits.   Lab Results  Component Value Date   CREATININE 0.94 06/09/2020   BUN 12 06/09/2020   NA 140 06/09/2020   K 4.0 06/09/2020   CL 102 06/09/2020   CO2 23 06/09/2020   Lab Results  Component Value Date   ALT 47 (H) 06/09/2020   AST 30 06/09/2020   ALKPHOS 60 06/09/2020   BILITOT 0.5 06/09/2020   Lab Results  Component Value Date   HGBA1C 5.5 06/09/2020   HGBA1C 5.5 02/25/2019   Lab Results  Component Value Date   INSULIN 21.1 06/09/2020   Lab Results  Component Value Date   TSH 1.610 06/09/2020   Lab Results  Component Value Date   CHOL 278 (H) 06/09/2020   HDL 40 06/09/2020   LDLCALC 198 (H) 06/09/2020   TRIG 205 (H) 06/09/2020   CHOLHDL 7.0 (H) 06/09/2020   Lab Results  Component Value Date   WBC 6.7 06/09/2020   HGB 14.8 06/09/2020   HCT 42.7 06/09/2020   MCV 90 06/09/2020   PLT 225 06/09/2020   Attestation Statements:   Reviewed by clinician on day of visit: allergies, medications, problem list, medical history, surgical history, family history, social history, and previous encounter notes.  I, Water quality scientist, CMA, am acting as Location manager for [employer], DO.  I have reviewed the above documentation for accuracy and completeness, and I agree with the above. Marjory Sneddon, D.O.  The [place] was signed into law in 2016 which includes the topic of electronic health records.  This provides immediate access to information in MyChart.  This includes consultation notes, operative notes, office notes, lab results and pathology  reports.  If you have any questions about what you read please let us know at your next visit so we can discuss your concerns and take corrective action if need be.  We are right here with you.

## 2020-07-11 ENCOUNTER — Other Ambulatory Visit (HOSPITAL_COMMUNITY)
Admission: RE | Admit: 2020-07-11 | Discharge: 2020-07-11 | Disposition: A | Discharge status: Home / Self Care | Payer: 59 | Source: Ambulatory Visit | Attending: General Surgery | Admitting: General Surgery

## 2020-07-11 DIAGNOSIS — Z01812 Encounter for preprocedural laboratory examination: Secondary | ICD-10-CM | POA: Diagnosis present

## 2020-07-11 DIAGNOSIS — Z20822 Contact with and (suspected) exposure to covid-19: Secondary | ICD-10-CM | POA: Diagnosis not present

## 2020-07-11 LAB — SARS CORONAVIRUS 2 (TAT 6-24 HRS): SARS Coronavirus 2: NEGATIVE

## 2020-07-14 ENCOUNTER — Ambulatory Visit
Admission: RE | Admit: 2020-07-14 | Discharge: 2020-07-14 | Disposition: A | Discharge status: Home / Self Care | Payer: 59 | Source: Ambulatory Visit | Attending: General Surgery | Admitting: General Surgery

## 2020-07-14 ENCOUNTER — Other Ambulatory Visit: Payer: Self-pay

## 2020-07-14 DIAGNOSIS — N6022 Fibroadenosis of left breast: Secondary | ICD-10-CM

## 2020-07-14 NOTE — Progress Notes (Signed)

## 2020-07-14 NOTE — Anesthesia Preprocedure Evaluation (Addendum)
Anesthesia Evaluation  Patient identified by MRN, date of birth, ID band Patient awake    Reviewed: Allergy & Precautions, H&P , NPO status , Patient's Chart, lab work & pertinent test results  Airway Mallampati: I  TM Distance: >3 FB Neck ROM: Full    Dental no notable dental hx. (+) Teeth Intact, Dental Advisory Given, Caps   Pulmonary neg pulmonary ROS,    Pulmonary exam normal breath sounds clear to auscultation       Cardiovascular Exercise Tolerance: Good hypertension, Pt. on medications Normal cardiovascular exam Rhythm:Regular Rate:Normal     Neuro/Psych PSYCHIATRIC DISORDERS Anxiety  Neuromuscular disease    GI/Hepatic negative GI ROS, Neg liver ROS,   Endo/Other  diabetes, Well ControlledMorbid obesity  Renal/GU negative Renal ROS  negative genitourinary   Musculoskeletal negative musculoskeletal ROS (+)   Abdominal   Peds negative pediatric ROS (+)  Hematology  (+) anemia ,   Anesthesia Other Findings   Reproductive/Obstetrics negative OB ROS                           Anesthesia Physical Anesthesia Plan  ASA: III  Anesthesia Plan: General   Post-op Pain Management:    Induction: Intravenous  PONV Risk Score and Plan: 3 and Ondansetron, Dexamethasone and Midazolam  Airway Management Planned: LMA  Additional Equipment: None  Intra-op Plan:   Post-operative Plan: Extubation in OR  Informed Consent: I have reviewed the patients History and Physical, chart, labs and discussed the procedure including the risks, benefits and alternatives for the proposed anesthesia with the patient or authorized representative who has indicated his/her understanding and acceptance.       Plan Discussed with: CRNA, Surgeon and Anesthesiologist  Anesthesia Plan Comments: ( )      Anesthesia Quick Evaluation

## 2020-07-15 ENCOUNTER — Ambulatory Visit (HOSPITAL_BASED_OUTPATIENT_CLINIC_OR_DEPARTMENT_OTHER)
Admission: RE | Admit: 2020-07-15 | Discharge: 2020-07-15 | Disposition: A | Discharge status: Home / Self Care | Payer: 59 | Attending: General Surgery | Admitting: General Surgery

## 2020-07-15 ENCOUNTER — Encounter (HOSPITAL_BASED_OUTPATIENT_CLINIC_OR_DEPARTMENT_OTHER)
Admission: RE | Disposition: A | Discharge status: Home / Self Care | Payer: Self-pay | Source: Home / Self Care | Attending: General Surgery

## 2020-07-15 ENCOUNTER — Ambulatory Visit (HOSPITAL_BASED_OUTPATIENT_CLINIC_OR_DEPARTMENT_OTHER): Payer: 59 | Admitting: Anesthesiology

## 2020-07-15 ENCOUNTER — Other Ambulatory Visit: Payer: Self-pay

## 2020-07-15 ENCOUNTER — Encounter (HOSPITAL_BASED_OUTPATIENT_CLINIC_OR_DEPARTMENT_OTHER): Payer: Self-pay | Admitting: General Surgery

## 2020-07-15 ENCOUNTER — Ambulatory Visit
Admission: RE | Admit: 2020-07-15 | Discharge: 2020-07-15 | Disposition: A | Discharge status: Home / Self Care | Payer: 59 | Source: Ambulatory Visit | Attending: General Surgery | Admitting: General Surgery

## 2020-07-15 DIAGNOSIS — Z836 Family history of other diseases of the respiratory system: Secondary | ICD-10-CM | POA: Insufficient documentation

## 2020-07-15 DIAGNOSIS — Z800 Family history of malignant neoplasm of digestive organs: Secondary | ICD-10-CM | POA: Diagnosis not present

## 2020-07-15 DIAGNOSIS — Z823 Family history of stroke: Secondary | ICD-10-CM | POA: Diagnosis not present

## 2020-07-15 DIAGNOSIS — Z8041 Family history of malignant neoplasm of ovary: Secondary | ICD-10-CM | POA: Insufficient documentation

## 2020-07-15 DIAGNOSIS — N6489 Other specified disorders of breast: Secondary | ICD-10-CM | POA: Insufficient documentation

## 2020-07-15 DIAGNOSIS — Z8261 Family history of arthritis: Secondary | ICD-10-CM | POA: Insufficient documentation

## 2020-07-15 DIAGNOSIS — Z8249 Family history of ischemic heart disease and other diseases of the circulatory system: Secondary | ICD-10-CM | POA: Insufficient documentation

## 2020-07-15 DIAGNOSIS — Z818 Family history of other mental and behavioral disorders: Secondary | ICD-10-CM | POA: Insufficient documentation

## 2020-07-15 DIAGNOSIS — N6022 Fibroadenosis of left breast: Secondary | ICD-10-CM

## 2020-07-15 DIAGNOSIS — Z803 Family history of malignant neoplasm of breast: Secondary | ICD-10-CM | POA: Diagnosis not present

## 2020-07-15 DIAGNOSIS — Z6833 Body mass index (BMI) 33.0-33.9, adult: Secondary | ICD-10-CM | POA: Insufficient documentation

## 2020-07-15 DIAGNOSIS — Z808 Family history of malignant neoplasm of other organs or systems: Secondary | ICD-10-CM | POA: Insufficient documentation

## 2020-07-15 HISTORY — PX: BREAST LUMPECTOMY WITH RADIOACTIVE SEED LOCALIZATION: SHX6424

## 2020-07-15 SURGERY — BREAST LUMPECTOMY WITH RADIOACTIVE SEED LOCALIZATION
Anesthesia: General | Site: Breast | Laterality: Left

## 2020-07-15 MED ORDER — DEXAMETHASONE SODIUM PHOSPHATE 10 MG/ML IJ SOLN
INTRAMUSCULAR | Status: AC
  Filled 2020-07-15: qty 1

## 2020-07-15 MED ORDER — CELECOXIB 200 MG PO CAPS
ORAL_CAPSULE | ORAL | Status: AC
  Filled 2020-07-15: qty 1

## 2020-07-15 MED ORDER — ONDANSETRON HCL 4 MG/2ML IJ SOLN
INTRAMUSCULAR | Status: DC | PRN
  Administered 2020-07-15: 4 mg via INTRAVENOUS

## 2020-07-15 MED ORDER — CHLORHEXIDINE GLUCONATE CLOTH 2 % EX PADS: 6.0000 | MEDICATED_PAD | Freq: Once | CUTANEOUS | Status: DC

## 2020-07-15 MED ORDER — FENTANYL CITRATE (PF) 100 MCG/2ML IJ SOLN
INTRAMUSCULAR | Status: AC
  Filled 2020-07-15: qty 2

## 2020-07-15 MED ORDER — CEFAZOLIN SODIUM-DEXTROSE 2-4 GM/100ML-% IV SOLN
INTRAVENOUS | Status: AC
  Filled 2020-07-15: qty 100

## 2020-07-15 MED ORDER — BUPIVACAINE-EPINEPHRINE (PF) 0.25% -1:200000 IJ SOLN
INTRAMUSCULAR | Status: DC | PRN
  Administered 2020-07-15: 20 mL

## 2020-07-15 MED ORDER — FENTANYL CITRATE (PF) 100 MCG/2ML IJ SOLN
INTRAMUSCULAR | Status: DC | PRN
  Administered 2020-07-15 (×2): 25 ug via INTRAVENOUS
  Administered 2020-07-15: 50 ug via INTRAVENOUS

## 2020-07-15 MED ORDER — ONDANSETRON HCL 4 MG/2ML IJ SOLN
INTRAMUSCULAR | Status: AC
  Filled 2020-07-15: qty 2

## 2020-07-15 MED ORDER — PHENYLEPHRINE 40 MCG/ML (10ML) SYRINGE FOR IV PUSH (FOR BLOOD PRESSURE SUPPORT)
PREFILLED_SYRINGE | INTRAVENOUS | Status: DC | PRN
  Administered 2020-07-15: 80 ug via INTRAVENOUS

## 2020-07-15 MED ORDER — PROPOFOL 10 MG/ML IV BOLUS
INTRAVENOUS | Status: DC | PRN
  Administered 2020-07-15: 200 mg via INTRAVENOUS

## 2020-07-15 MED ORDER — DIPHENHYDRAMINE HCL 50 MG/ML IJ SOLN
INTRAMUSCULAR | Status: DC | PRN
Start: 2020-07-15 — End: 2020-07-15
  Administered 2020-07-15: 12.5 mg via INTRAVENOUS

## 2020-07-15 MED ORDER — CELECOXIB 200 MG PO CAPS
200.0000 mg | ORAL_CAPSULE | ORAL | Status: AC
  Administered 2020-07-15: 200 mg via ORAL

## 2020-07-15 MED ORDER — ACETAMINOPHEN 500 MG PO TABS
ORAL_TABLET | ORAL | Status: AC
  Filled 2020-07-15: qty 2

## 2020-07-15 MED ORDER — PROPOFOL 500 MG/50ML IV EMUL
INTRAVENOUS | Status: AC
  Filled 2020-07-15: qty 50

## 2020-07-15 MED ORDER — PHENYLEPHRINE 40 MCG/ML (10ML) SYRINGE FOR IV PUSH (FOR BLOOD PRESSURE SUPPORT)
PREFILLED_SYRINGE | INTRAVENOUS | Status: AC
  Filled 2020-07-15: qty 10

## 2020-07-15 MED ORDER — MIDAZOLAM HCL 5 MG/5ML IJ SOLN
INTRAMUSCULAR | Status: DC | PRN
  Administered 2020-07-15: 2 mg via INTRAVENOUS

## 2020-07-15 MED ORDER — DIPHENHYDRAMINE HCL 50 MG/ML IJ SOLN
INTRAMUSCULAR | Status: AC
  Filled 2020-07-15: qty 1

## 2020-07-15 MED ORDER — GABAPENTIN 300 MG PO CAPS
ORAL_CAPSULE | ORAL | Status: AC
  Filled 2020-07-15: qty 1

## 2020-07-15 MED ORDER — ACETAMINOPHEN 500 MG PO TABS
1000.0000 mg | ORAL_TABLET | ORAL | Status: AC
  Administered 2020-07-15: 1000 mg via ORAL

## 2020-07-15 MED ORDER — MIDAZOLAM HCL 2 MG/2ML IJ SOLN
INTRAMUSCULAR | Status: AC
  Filled 2020-07-15: qty 2

## 2020-07-15 MED ORDER — LACTATED RINGERS IV SOLN: INTRAVENOUS | Status: DC

## 2020-07-15 MED ORDER — LIDOCAINE 2% (20 MG/ML) 5 ML SYRINGE
INTRAMUSCULAR | Status: DC | PRN
  Administered 2020-07-15: 80 mg via INTRAVENOUS

## 2020-07-15 MED ORDER — GABAPENTIN 300 MG PO CAPS
300.0000 mg | ORAL_CAPSULE | ORAL | Status: AC
  Administered 2020-07-15: 300 mg via ORAL

## 2020-07-15 MED ORDER — LIDOCAINE 2% (20 MG/ML) 5 ML SYRINGE
INTRAMUSCULAR | Status: AC
  Filled 2020-07-15: qty 5

## 2020-07-15 MED ORDER — CEFAZOLIN SODIUM-DEXTROSE 2-4 GM/100ML-% IV SOLN
2.0000 g | INTRAVENOUS | Status: AC
  Administered 2020-07-15: 2 g via INTRAVENOUS

## 2020-07-15 MED ORDER — HYDROCODONE-ACETAMINOPHEN 5-325 MG PO TABS: 1.0000 | ORAL_TABLET | Freq: Four times a day (QID) | ORAL | 0 refills | Status: DC | PRN

## 2020-07-15 MED ORDER — DEXAMETHASONE SODIUM PHOSPHATE 10 MG/ML IJ SOLN
INTRAMUSCULAR | Status: DC | PRN
  Administered 2020-07-15: 10 mg via INTRAVENOUS

## 2020-07-15 SURGICAL SUPPLY — 43 items
ADH SKN CLS APL DERMABOND .7 (GAUZE/BANDAGES/DRESSINGS) ×1
APL PRP STRL LF DISP 70% ISPRP (MISCELLANEOUS) ×1
APPLIER CLIP 9.375 MED OPEN (MISCELLANEOUS)
APR CLP MED 9.3 20 MLT OPN (MISCELLANEOUS)
BLADE SURG 15 STRL LF DISP TIS (BLADE) ×1 IMPLANT
BLADE SURG 15 STRL SS (BLADE) ×2
CANISTER SUC SOCK COL 7IN (MISCELLANEOUS) ×2 IMPLANT
CANISTER SUCT 1200ML W/VALVE (MISCELLANEOUS) ×2 IMPLANT
CHLORAPREP W/TINT 26 (MISCELLANEOUS) ×2 IMPLANT
CLIP APPLIE 9.375 MED OPEN (MISCELLANEOUS) IMPLANT
COVER BACK TABLE 60X90IN (DRAPES) ×2 IMPLANT
COVER MAYO STAND STRL (DRAPES) ×2 IMPLANT
COVER PROBE W GEL 5X96 (DRAPES) ×2 IMPLANT
DECANTER SPIKE VIAL GLASS SM (MISCELLANEOUS) IMPLANT
DERMABOND ADVANCED (GAUZE/BANDAGES/DRESSINGS) ×1
DERMABOND ADVANCED .7 DNX12 (GAUZE/BANDAGES/DRESSINGS) ×1 IMPLANT
DRAPE LAPAROSCOPIC ABDOMINAL (DRAPES) ×2 IMPLANT
DRAPE UTILITY XL STRL (DRAPES) ×2 IMPLANT
ELECT COATED BLADE 2.86 ST (ELECTRODE) ×2 IMPLANT
ELECT REM PT RETURN 9FT ADLT (ELECTROSURGICAL) ×2
ELECTRODE REM PT RTRN 9FT ADLT (ELECTROSURGICAL) ×1 IMPLANT
GLOVE SURG ENC MOIS LTX SZ7.5 (GLOVE) ×4 IMPLANT
GLOVE SURG SYN 7.0 (GLOVE) ×2 IMPLANT
GLOVE SURG UNDER POLY LF SZ7 (GLOVE) ×2 IMPLANT
GOWN STRL REUS W/ TWL LRG LVL3 (GOWN DISPOSABLE) ×2 IMPLANT
GOWN STRL REUS W/TWL LRG LVL3 (GOWN DISPOSABLE) ×4
ILLUMINATOR WAVEGUIDE N/F (MISCELLANEOUS) IMPLANT
KIT MARKER MARGIN INK (KITS) ×2 IMPLANT
LIGHT WAVEGUIDE WIDE FLAT (MISCELLANEOUS) IMPLANT
NEEDLE HYPO 25X1 1.5 SAFETY (NEEDLE) IMPLANT
NS IRRIG 1000ML POUR BTL (IV SOLUTION) IMPLANT
PACK BASIN DAY SURGERY FS (CUSTOM PROCEDURE TRAY) ×2 IMPLANT
PENCIL SMOKE EVACUATOR (MISCELLANEOUS) ×2 IMPLANT
SLEEVE SCD COMPRESS KNEE MED (MISCELLANEOUS) ×2 IMPLANT
SPONGE LAP 18X18 RF (DISPOSABLE) ×2 IMPLANT
SUT MON AB 4-0 PC3 18 (SUTURE) ×2 IMPLANT
SUT SILK 2 0 SH (SUTURE) IMPLANT
SUT VICRYL 3-0 CR8 SH (SUTURE) ×2 IMPLANT
SYR CONTROL 10ML LL (SYRINGE) IMPLANT
TOWEL GREEN STERILE FF (TOWEL DISPOSABLE) ×2 IMPLANT
TRAY FAXITRON CT DISP (TRAY / TRAY PROCEDURE) IMPLANT
TUBE CONNECTING 20X1/4 (TUBING) ×2 IMPLANT
YANKAUER SUCT BULB TIP NO VENT (SUCTIONS) IMPLANT

## 2020-07-15 NOTE — Anesthesia Procedure Notes (Signed)
Procedure Name: LMA Insertion Date/Time: 07/15/2020 8:38 AM Performed by: Genelle Bal, CRNA Pre-anesthesia Checklist: Patient identified, Emergency Drugs available, Suction available and Patient being monitored Patient Re-evaluated:Patient Re-evaluated prior to induction Oxygen Delivery Method: Circle system utilized Preoxygenation: Pre-oxygenation with 100% oxygen Induction Type: IV induction Ventilation: Mask ventilation without difficulty LMA: LMA inserted LMA Size: 4.0 Number of attempts: 1 Airway Equipment and Method: Bite block Placement Confirmation: positive ETCO2 Tube secured with: Tape Dental Injury: Teeth and Oropharynx as per pre-operative assessment

## 2020-07-15 NOTE — Anesthesia Postprocedure Evaluation (Signed)
Anesthesia Post Note  Patient: Andrea Jones  Procedure(s) Performed: LEFT BREAST LUMPECTOMY WITH RADIOACTIVE SEED LOCALIZATION (Left Breast)     Patient location during evaluation: PACU Anesthesia Type: General Level of consciousness: awake and alert Pain management: pain level controlled Vital Signs Assessment: post-procedure vital signs reviewed and stable Respiratory status: spontaneous breathing, nonlabored ventilation, respiratory function stable and patient connected to nasal cannula oxygen Cardiovascular status: blood pressure returned to baseline and stable Postop Assessment: no apparent nausea or vomiting Anesthetic complications: no   No complications documented.  Last Vitals:  Vitals:   07/15/20 1006 07/15/20 1015  BP:  113/70  Pulse: 80 82  Resp: 15 16  Temp:  36.5 C  SpO2: 94% 95%    Last Pain:  Vitals:   07/15/20 1015  TempSrc:   PainSc: 1                  Kerington Hildebrant

## 2020-07-15 NOTE — Transfer of Care (Signed)
Immediate Anesthesia Transfer of Care Note  Patient: Sorcha Rotunno Ayub  Procedure(s) Performed: LEFT BREAST LUMPECTOMY WITH RADIOACTIVE SEED LOCALIZATION (Left Breast)  Patient Location: PACU  Anesthesia Type:General  Level of Consciousness: awake, alert  and oriented  Airway & Oxygen Therapy: Patient Spontanous Breathing and Patient connected to face mask oxygen  Post-op Assessment: Report given to RN and Post -op Vital signs reviewed and stable  Post vital signs: Reviewed and stable  Last Vitals:  Vitals Value Taken Time  BP 114/74 07/15/20 0927  Temp    Pulse 93 07/15/20 0929  Resp 13 07/15/20 0929  SpO2 98 % 07/15/20 0929  Vitals shown include unvalidated device data.  Last Pain:  Vitals:   07/15/20 0748  TempSrc: Oral  PainSc: 0-No pain      Patients Stated Pain Goal: 6 (95/74/73 4037)  Complications: No complications documented.

## 2020-07-15 NOTE — H&P (Signed)
Erie Noe Saldierna  Location: [facility] Patient #: [id number] DOB: 07-25-1960 Married / Language: English / Race: White Female   History of Present Illness The patient is a 60 year old female who presents for a follow-up for Breast mass. the patient is a 60 year old white female who was first found to have a 7 mm complex sclerosing lesion in the upper outer quadrant of the left breast about one year ago. At that time she elected for observation. On her more recent mammogram she has a persistence of the area that measures 4 mm. The radiologist continue to find the area suspicious and would recommend that this area be removed. She has had no significant health issues in the last year other than some early hypertension.   Problem List/Past Medical  SCLEROSING ADENOSIS OF BREAST, LEFT (N60.22)   Past Surgical History  Appendectomy  Breast Biopsy  Left. Cesarean Section - 1  Hysterectomy (not due to cancer) - Complete   Diagnostic Studies History  Mammogram  within last year Pap Smear  1-5 years ago  Allergies No Known Drug Allergies   Medication History  Biotin (Oral) Specific strength unknown - Active. Multi-Vitamin (Oral) Active. Fiber (Oral) Specific strength unknown - Active. Chromium (Oral) Specific strength unknown - Active. Immune Health Blend (Oral) Active. Fish Oil (Oral) Specific strength unknown - Active. amLODIPine Besylate (10MG  Tablet, Oral) Active. Medications Reconciled  Social History Caffeine use  Carbonated beverages, Tea. No alcohol use  No drug use  Tobacco use  Never smoker.  Family History  Arthritis  Mother. Bleeding disorder  Mother. Breast Cancer  Family Members In General. Cerebrovascular Accident  Mother. Colon Cancer  Father. Depression  Mother. Heart Disease  Father, Mother. Hypertension  Mother. Melanoma  Mother. Ovarian Cancer  Sister. Respiratory Condition  Mother.  Pregnancy / Birth  History Age at menarche  6 years. Age of menopause  66-50 Gravida  3 Maternal age  43-25 Para  53  Other Problems  Heart murmur  Hypercholesterolemia  Lump In Breast  Oophorectomy  Bilateral.    Review of Systems General Not Present- Appetite Loss, Chills, Fatigue, Fever, Night Sweats, Weight Gain and Weight Loss. Skin Not Present- Change in Wart/Mole, Dryness, Hives, Jaundice, New Lesions, Non-Healing Wounds, Rash and Ulcer. HEENT Present- Wears glasses/contact lenses. Not Present- Earache, Hearing Loss, Hoarseness, Nose Bleed, Oral Ulcers, Ringing in the Ears, Seasonal Allergies, Sinus Pain, Sore Throat, Visual Disturbances and Yellow Eyes. Respiratory Not Present- Bloody sputum, Chronic Cough, Difficulty Breathing, Snoring and Wheezing. Cardiovascular Not Present- Chest Pain, Difficulty Breathing Lying Down, Leg Cramps, Palpitations, Rapid Heart Rate, Shortness of Breath and Swelling of Extremities. Gastrointestinal Not Present- Abdominal Pain, Bloating, Bloody Stool, Change in Bowel Habits, Chronic diarrhea, Constipation, Difficulty Swallowing, Excessive gas, Gets full quickly at meals, Hemorrhoids, Indigestion, Nausea, Rectal Pain and Vomiting. Female Genitourinary Not Present- Frequency, Nocturia, Painful Urination, Pelvic Pain and Urgency. Musculoskeletal Present- Joint Pain. Not Present- Back Pain, Joint Stiffness, Muscle Pain, Muscle Weakness and Swelling of Extremities. Neurological Present- Tingling. Not Present- Decreased Memory, Fainting, Headaches, Numbness, Seizures, Tremor, Trouble walking and Weakness. Psychiatric Not Present- Anxiety, Bipolar, Change in Sleep Pattern, Depression, Fearful and Frequent crying. Endocrine Not Present- Cold Intolerance, Excessive Hunger, Hair Changes, Heat Intolerance, Hot flashes and New Diabetes. Hematology Not Present- Blood Thinners, Easy Bruising, Excessive bleeding, Gland problems, HIV and Persistent Infections.  Vitals   Weight: 190.38 lb Height: 62in Body Surface Area: 1.87 m Body Mass Index: 34.82 kg/m  Pulse: 92 (Regular)  Physical Exam  General Mental Status-Alert. General Appearance-Consistent with stated age. Hydration-Well hydrated. Voice-Normal.  Head and Neck Head-normocephalic, atraumatic with no lesions or palpable masses. Trachea-midline. Thyroid Gland Characteristics - normal size and consistency.  Eye Eyeball - Bilateral-Extraocular movements intact. Sclera/Conjunctiva - Bilateral-No scleral icterus.  Chest and Lung Exam Chest and lung exam reveals -quiet, even and easy respiratory effort with no use of accessory muscles and on auscultation, normal breath sounds, no adventitious sounds and normal vocal resonance. Inspection Chest Wall - Normal. Back - normal.  Breast Note: there is no palpable mass in either breast. There is no palpable axillary, supraclavicular, or cervical lymphadenopathy.   Cardiovascular Cardiovascular examination reveals -normal heart sounds, regular rate and rhythm with no murmurs and normal pedal pulses bilaterally.  Abdomen Inspection Inspection of the abdomen reveals - No Hernias. Skin - Scar - no surgical scars. Palpation/Percussion Palpation and Percussion of the abdomen reveal - Soft, Non Tender, No Rebound tenderness, No Rigidity (guarding) and No hepatosplenomegaly. Auscultation Auscultation of the abdomen reveals - Bowel sounds normal.  Neurologic Neurologic evaluation reveals -alert and oriented x 3 with no impairment of recent or remote memory. Mental Status-Normal.  Musculoskeletal Normal Exam - Left-Upper Extremity Strength Normal and Lower Extremity Strength Normal. Normal Exam - Right-Upper Extremity Strength Normal and Lower Extremity Strength Normal.  Lymphatic Head & Neck  General Head & Neck Lymphatics: Bilateral - Description - Normal. Axillary  General Axillary Region:  Bilateral - Description - Normal. Tenderness - Non Tender. Femoral & Inguinal  Generalized Femoral & Inguinal Lymphatics: Bilateral - Description - Normal. Tenderness - Non Tender.    Assessment & Plan  SCLEROSING ADENOSIS OF BREAST, LEFT (N60.22) Impression: the patient appears to have a 4 mm area of complex sclerosing lesion in the upper outer quadrant of the left breast. Because of its abnormal appearance and persistence on subsequent mammograms the recommendation is to have this area removed. I have discussed with her in detail the risks and benefits of the operation as well as some of the technical aspects including the use of a radiofrequency or radioactive seed for localization and she understands and wishes to proceed. This patient encounter took 20 minutes today to perform the following: take history, perform exam, review outside records, interpret imaging, counsel the patient on their diagnosis and document encounter, findings & plan in the EHR

## 2020-07-15 NOTE — Discharge Instructions (Signed)

## 2020-07-15 NOTE — Interval H&P Note (Signed)
History and Physical Interval Note:  07/15/2020 8:11 AM  Andrea Jones  has presented today for surgery, with the diagnosis of LEFT BREAST COMPLEX SCLEROSING LESION.  The various methods of treatment have been discussed with the patient and family. After consideration of risks, benefits and other options for treatment, the patient has consented to  Procedure(s): LEFT BREAST LUMPECTOMY WITH RADIOACTIVE SEED LOCALIZATION (Left) as a surgical intervention.  The patient's history has been reviewed, patient examined, no change in status, stable for surgery.  I have reviewed the patient's chart and labs.  Questions were answered to the patient's satisfaction.     Autumn Messing III

## 2020-07-15 NOTE — Op Note (Signed)
07/15/2020  9:19 AM  PATIENT:  Andrea Jones  60 y.o. female  PRE-OPERATIVE DIAGNOSIS:  LEFT BREAST COMPLEX SCLEROSING LESION  POST-OPERATIVE DIAGNOSIS:  LEFT BREAST COMPLEX SCLEROSING LESION  PROCEDURE:  Procedure(s): LEFT BREAST LUMPECTOMY WITH RADIOACTIVE SEED LOCALIZATION (Left)  SURGEON:  Surgeon(s) and Role:    Jovita Kussmaul, MD - Primary  PHYSICIAN ASSISTANT:   ASSISTANTS: none   ANESTHESIA:   local and general  EBL:  5 mL   BLOOD ADMINISTERED:none  DRAINS: none   LOCAL MEDICATIONS USED:  MARCAINE     SPECIMEN:  Source of Specimen:  left breast tissue  DISPOSITION OF SPECIMEN:  PATHOLOGY  COUNTS:  YES  TOURNIQUET:  * No tourniquets in log *  DICTATION: .Dragon Dictation   After informed consent was obtained the patient was brought to the operating room and placed in the supine position on the operating table.  After adequate induction of general anesthesia the patient's left breast was prepped with ChloraPrep, allowed to dry, and draped in usual sterile manner.  An appropriate timeout was performed.  Previously an I-125 seed was placed in the upper central left breast to mark an area of complex sclerosing lesion.  The neoprobe was set to I-125 in the area of radioactivity was readily identified.  The area around this was infiltrated with quarter percent Marcaine.  A curvilinear incision was made with a 15 blade knife along the upper edge of the areola of the left breast.  The incision was carried through the skin and subcutaneous tissue sharply with the electrocautery.  Dissection was then carried towards the radioactive seed under the direction of the neoprobe.  Once I more closely approached the radioactive seed I then removed a circular portion of breast tissue sharply with the electrocautery around the radioactive seed while checking the area of radioactivity frequently.  Once the specimen was removed it was oriented with the appropriate paint colors.  A  specimen radiograph was obtained that showed the clip and seed to be within the specimen.  The specimen was then sent to pathology for further evaluation.  Hemostasis was achieved using the Bovie electrocautery.  The wound was irrigated with saline and infiltrated with more quarter percent Marcaine.  The deep layer of the wound was then closed with layers of interrupted 3-0 Vicryl stitches.  The skin was closed with interrupted 4-0 Monocryl subcuticular stitches.  Dermabond dressings were applied.  The patient tolerated the procedure well.  At the end of the case all needle sponge and instrument counts were correct.  The patient was then awakened and taken recovery in stable condition.  PLAN OF CARE: Discharge to home after PACU  PATIENT DISPOSITION:  PACU - hemodynamically stable.   Delay start of Pharmacological VTE agent (>24hrs) due to surgical blood loss or risk of bleeding: not applicable

## 2020-07-16 ENCOUNTER — Encounter (HOSPITAL_BASED_OUTPATIENT_CLINIC_OR_DEPARTMENT_OTHER): Payer: Self-pay | Admitting: General Surgery

## 2020-07-17 LAB — SURGICAL PATHOLOGY

## 2020-07-21 ENCOUNTER — Other Ambulatory Visit: Payer: Self-pay

## 2020-07-21 ENCOUNTER — Encounter (INDEPENDENT_AMBULATORY_CARE_PROVIDER_SITE_OTHER): Payer: Self-pay | Admitting: Family Medicine

## 2020-07-21 ENCOUNTER — Other Ambulatory Visit (INDEPENDENT_AMBULATORY_CARE_PROVIDER_SITE_OTHER): Payer: Self-pay | Admitting: Family Medicine

## 2020-07-21 ENCOUNTER — Telehealth (INDEPENDENT_AMBULATORY_CARE_PROVIDER_SITE_OTHER): Payer: 59 | Admitting: Family Medicine

## 2020-07-21 VITALS — Wt 177.0 lb

## 2020-07-21 DIAGNOSIS — E559 Vitamin D deficiency, unspecified: Secondary | ICD-10-CM | POA: Diagnosis not present

## 2020-07-21 DIAGNOSIS — I10 Essential (primary) hypertension: Secondary | ICD-10-CM

## 2020-07-21 DIAGNOSIS — E8881 Metabolic syndrome: Secondary | ICD-10-CM | POA: Diagnosis not present

## 2020-07-21 DIAGNOSIS — E669 Obesity, unspecified: Secondary | ICD-10-CM

## 2020-07-21 DIAGNOSIS — E7849 Other hyperlipidemia: Secondary | ICD-10-CM

## 2020-07-21 DIAGNOSIS — Z6832 Body mass index (BMI) 32.0-32.9, adult: Secondary | ICD-10-CM

## 2020-07-21 DIAGNOSIS — Z9189 Other specified personal risk factors, not elsewhere classified: Secondary | ICD-10-CM

## 2020-07-21 MED ORDER — VITAMIN D (ERGOCALCIFEROL) 1.25 MG (50000 UNIT) PO CAPS: 50000.0000 [IU] | ORAL_CAPSULE | ORAL | 0 refills | Status: DC

## 2020-07-21 NOTE — Patient Instructions (Signed)
The 10-year ASCVD risk score Mikey Bussing DC Brooke Bonito., et al., 2013) is: 5.2%   Values used to calculate the score:     Age: 60 years     Sex: Female     Is Non-Hispanic African American: No     Diabetic: No     Tobacco smoker: No     Systolic Blood Pressure: 301 mmHg     Is BP treated: Yes     HDL Cholesterol: 40 mg/dL     Total Cholesterol: 278 mg/dL

## 2020-07-22 ENCOUNTER — Ambulatory Visit: Payer: 59 | Admitting: Obstetrics & Gynecology

## 2020-07-23 ENCOUNTER — Encounter: Payer: Self-pay | Admitting: Obstetrics & Gynecology

## 2020-07-23 ENCOUNTER — Ambulatory Visit: Payer: 59 | Admitting: Obstetrics & Gynecology

## 2020-07-23 ENCOUNTER — Other Ambulatory Visit: Payer: Self-pay

## 2020-07-23 ENCOUNTER — Other Ambulatory Visit (HOSPITAL_COMMUNITY)
Admission: RE | Admit: 2020-07-23 | Discharge: 2020-07-23 | Disposition: A | Discharge status: Home / Self Care | Payer: 59 | Source: Ambulatory Visit | Attending: Obstetrics & Gynecology | Admitting: Obstetrics & Gynecology

## 2020-07-23 VITALS — BP 120/80 | Ht 62.0 in | Wt 180.0 lb

## 2020-07-23 DIAGNOSIS — D069 Carcinoma in situ of cervix, unspecified: Secondary | ICD-10-CM | POA: Diagnosis not present

## 2020-07-23 NOTE — Progress Notes (Signed)
HPI:  Patient is a 60 y.o. [id number] presenting for follow up evaluation of abnormal PAP smear in the past.  Her last PAP was 6 months ago and was abnormal: LGSIL, as it has been for the last 2 years on 6 month intervals. She has had a prior colposcopy. Prior biopsies (if done) were normal, this was in 01/2018.  PMHx: She  has a past medical history of Anemia, Anxiety, Chest pain, Constipation, Family history of ovarian cancer, Heart murmur, Heartburn, High blood pressure, Hot flashes, Hypercholesteremia, Hyperlipidemia, Insomnia, Joint pain, Liver problem, MGUS (monoclonal gammopathy of unknown significance), MGUS (monoclonal gammopathy of unknown significance), Nerve pain, Palpitations, SOB (shortness of breath) on exertion, Unspecified lump in the left breast, unspecified quadrant, and Vitamin D deficiency (2016). Also,  has a past surgical history that includes Appendectomy (1982); Abdominal hysterectomy (2001); Ovary surgery (2000); Cesarean section (1985); Colonoscopy (06/13/2012); Hysterectomy abdominal with salpingectomy; Oophorectomy; mgus; Breast biopsy (Left, 1999); Breast biopsy (Left, 03/15/2019); and Breast lumpectomy with radioactive seed localization (Left, 07/15/2020)., family history includes Alcoholism in her father; Anxiety disorder in her mother; Breast cancer (age of onset: 79) in her cousin; Cancer in her father; Colon cancer (age of onset: 30) in her father; Congestive Heart Failure in her mother; Depression in her mother; Emphysema in her mother; Heart disease in her father; Hyperlipidemia in her mother; Hypertension in her mother; Kidney failure in her mother; Neuropathy in her mother; Obesity in her mother; Ovarian cancer (age of onset: 67) in her sister.,  reports that she has never smoked. She has never used smokeless tobacco. She reports that she does not drink alcohol and does not use drugs.  She has a current medication list which includes the following prescription(s): amlodipine,  biotin, calcium, cinnamon, fiber, multiple vitamins-minerals, omega-3 fatty acids, OVER THE COUNTER MEDICATION, vitamin d (ergocalciferol), vitamin e, and hydrocodone-acetaminophen. Also, has No Known Allergies.  Review of Systems  All other systems reviewed and are negative.   Objective: BP 120/80   Ht 5\' 2"  (1.575 m)   Wt 180 lb (81.6 kg)   BMI 32.92 kg/m  Filed Weights   07/23/20 0804  Weight: 180 lb (81.6 kg)   Body mass index is 32.92 kg/m.  Physical examination Physical Exam Constitutional:      General: She is not in acute distress.    Appearance: She is well-developed.  Genitourinary:     Vagina and uterus normal.     No vaginal erythema or bleeding.      Right Adnexa: not tender and no mass present.    Left Adnexa: not tender and no mass present.    No cervical motion tenderness, discharge, polyp or nabothian cyst.     Cervical exam comments: Small, flush w vag apex, mobile.     Uterus is mobile.     Uterus is absent and midaxial.     Pelvic exam was performed with patient in the lithotomy position.  HENT:     Head: Normocephalic and atraumatic.     Nose: Nose normal.  Abdominal:     General: There is no distension.     Palpations: Abdomen is soft.     Tenderness: There is no abdominal tenderness.  Musculoskeletal:        General: Normal range of motion.  Neurological:     Mental Status: She is alert and oriented to person, place, and time.     Cranial Nerves: No cranial nerve deficit.  Skin:    General: Skin is warm  and dry.  Psychiatric:        Attention and Perception: Attention normal.        Mood and Affect: Mood and affect normal.        Speech: Speech normal.        Behavior: Behavior normal.        Thought Content: Thought content normal.        Judgment: Judgment normal.     ASSESSMENT:  History of Cervical Dysplasia  Plan:   ICD-10-CM   1. Low grade cervical glandular intraepithelial neoplasia  D06.9 Cytology - PAP   1.  I discussed  the grading system of pap smears and HPV high risk viral types.   2. Follow up PAP 6 months, vs intervention if high grade dysplasia identified. 3. Options for cryo/LEEP/trachelectomy also presented to patient for residual or progressive disease. If LGSIL, then Cryo is the decision we have made today. If HGSIL, then trachelectomy is the decision we have made together today. If normal, then follow up 6 mos.  A total of 20 minutes were spent face-to-face with the patient as well as preparation, review, communication, and documentation during this encounter.    Barnett Applebaum, MD, [facility], [facility] 07/23/2020  8:07 AM

## 2020-07-27 LAB — CYTOLOGY - PAP

## 2020-07-28 NOTE — Progress Notes (Signed)
Sch CRYO procedure w PH in reg room  Discussed w pt results and pros/cons of cryo vs trachelectomy.  Agree to proceed w Cryo to cervix.

## 2020-07-28 NOTE — Progress Notes (Signed)
TeleHealth Visit:  Due to the COVID-19 pandemic, this visit was completed with telemedicine (audio/video) technology to reduce patient and provider exposure as well as to preserve personal protective equipment.   Andrea Jones has verbally consented to this TeleHealth visit. The patient is located at home, the provider is located at the [facility] office. The participants in this visit include the listed provider and patient and. The visit was conducted today via [place].  OBESITY Andrea Jones is here to discuss her progress with her obesity treatment plan along with follow-up of her obesity related diagnoses.   Today's visit was #: 4 Starting weight: 183 lbs Starting date: 06/09/2020 Today's date: 07/21/2020  Interim History:  Andrea Jones had a left breast lumpectomy recently.  It was benign, but it was stressful to go through.  She increased her water intake from 24 ounces per day prior to about 50 ounces per day now.  Denies hunger or cravings.  Current Meal Plan: the Category 3 Plan for 80-90% of the time.  Current Exercise Plan: Walking for 20 minutes 3 times per week.  This patient is following the prescribed meal plan meal without concerns.  Food recall appears to be consistent with the prescribed plan.  When following the plan, hunger and cravings are well controlled.    Assessment/Plan:   1. Essential hypertension At goal. Medications: Norvasc 10 mg daily.  She is not checking her blood pressure at home.  Asymptomatic.  No concerns or issues.  Prior to surgery last week, blood pressure was 130s/80s, she says.  Plan:  Continue medication at current dose.    BP Readings from Last 3 Encounters:  07/23/20 120/80  07/15/20 113/70  07/07/20 128/82   Lab Results  Component Value Date   CREATININE 0.94 06/09/2020   2. Other hyperlipidemia Course: Not at goal. Lipid-lowering medications: Omega 3 and diet controlled.  Plan:  Reminded her of the importance of decreasing  saturated and trans fats in diet and increasing exercise.  Recheck in 3 months if 5-10% of weight loss has been achieved.  Dietary changes: Increase soluble fiber, decrease simple carbohydrates, decrease saturated fat. Exercise changes: Moderate to vigorous-intensity aerobic activity 150 minutes per week or as tolerated. We will continue to monitor along with PCP/specialists as it pertains to her weight loss journey.  Lab Results  Component Value Date   CHOL 278 (H) 06/09/2020   HDL 40 06/09/2020   LDLCALC 198 (H) 06/09/2020   TRIG 205 (H) 06/09/2020   CHOLHDL 7.0 (H) 06/09/2020   Lab Results  Component Value Date   ALT 47 (H) 06/09/2020   AST 30 06/09/2020   ALKPHOS 60 06/09/2020   BILITOT 0.5 06/09/2020   The 10-year ASCVD risk score [name], et al., 2013) is: 5.8%   Values used to calculate the score:     Age: 60 years     Sex: Female     Is Non-Hispanic African American: No     Diabetic: No     Tobacco smoker: No     Systolic Blood Pressure: 867 mmHg     Is BP treated: Yes     HDL Cholesterol: 40 mg/dL     Total Cholesterol: 278 mg/dL  3. Insulin resistance Not optimized. Goal is HgbA1c < 5.7, fasting insulin closer to 5.  Medication: None.    Plan:  Consider medication in the future to aid with weight loss.  Continue prudent nutritional plan and weight loss.  Will monitor every  3-4 months as needed.  She will continue to focus on protein-rich, low simple carbohydrate foods. We reviewed the importance of hydration, regular exercise for stress reduction, and restorative sleep.   Lab Results  Component Value Date   HGBA1C 5.5 06/09/2020   Lab Results  Component Value Date   INSULIN 21.1 06/09/2020   4. Vitamin D deficiency Not at goal. Current vitamin D is 23.3, tested on 06/09/2020. Optimal goal > 50 ng/dL.    Plan: Continue to take prescription Vitamin D @50 ,000 IU every week as prescribed.  Follow-up for routine testing of Vitamin D, at least 2-3 times per year  to avoid over-replacement.  - Refill Vitamin D, Ergocalciferol, (DRISDOL) 1.25 MG (50000 UNIT) CAPS capsule; Take 1 capsule (50,000 Units total) by mouth every 7 (seven) days.  Dispense: 4 capsule; Refill: 0  5. Class 1 obesity with serious comorbidity and body mass index (BMI) of 32.0 to 32.9 in adult, unspecified obesity type  Course: [name] is currently in the action stage of change. As such, her goal is to continue with weight loss efforts.   Nutrition goals: She has agreed to the Category 3 Plan.   Exercise goals: As is.  Behavioral modification strategies: ways to avoid boredom eating, avoiding temptations and planning for success.  [name] has agreed to follow-up with our clinic in 2-3 weeks. She was informed of the importance of frequent follow-up visits to maximize her success with intensive lifestyle modifications for her multiple health conditions.   Objective:   Weight 177 lb (80.3 kg). Body mass index is 32.37 kg/m.  General: Cooperative, alert, well developed, in no acute distress. HEENT: Conjunctivae and lids unremarkable. Cardiovascular: Regular rhythm.  Lungs: Normal work of breathing. Neurologic: No focal deficits.   Lab Results  Component Value Date   CREATININE 0.94 06/09/2020   BUN 12 06/09/2020   NA 140 06/09/2020   K 4.0 06/09/2020   CL 102 06/09/2020   CO2 23 06/09/2020   Lab Results  Component Value Date   ALT 47 (H) 06/09/2020   AST 30 06/09/2020   ALKPHOS 60 06/09/2020   BILITOT 0.5 06/09/2020   Lab Results  Component Value Date   HGBA1C 5.5 06/09/2020   HGBA1C 5.5 02/25/2019   Lab Results  Component Value Date   INSULIN 21.1 06/09/2020   Lab Results  Component Value Date   TSH 1.610 06/09/2020   Lab Results  Component Value Date   CHOL 278 (H) 06/09/2020   HDL 40 06/09/2020   LDLCALC 198 (H) 06/09/2020   TRIG 205 (H) 06/09/2020   CHOLHDL 7.0 (H) 06/09/2020   Lab Results  Component Value Date   WBC 6.7 06/09/2020   HGB 14.8  06/09/2020   HCT 42.7 06/09/2020   MCV 90 06/09/2020   PLT 225 06/09/2020   Attestation Statements:   Reviewed by clinician on day of visit: allergies, medications, problem list, medical history, surgical history, family history, social history, and previous encounter notes.  I, Water quality scientist, CMA, am acting as Location manager for [employer], DO.  I have reviewed the above documentation for accuracy and completeness, and I agree with the above. Marjory Sneddon, D.O.  The [place] was signed into law in 2016 which includes the topic of electronic health records.  This provides immediate access to information in MyChart.  This includes consultation notes, operative notes, office notes, lab results and pathology reports.  If you have any questions about what you read please  let us know at your next visit so we can discuss your concerns and take corrective action if need be.  We are right here with you.

## 2020-07-29 ENCOUNTER — Telehealth: Payer: Self-pay

## 2020-07-29 NOTE — Telephone Encounter (Signed)
-----   Message from [name], MD sent at 07/28/2020  7:57 AM EST ----- Sch CRYO procedure w PH in reg room  Discussed w pt results and pros/cons of cryo vs trachelectomy.  Agree to proceed w Cryo to cervix.

## 2020-07-29 NOTE — Telephone Encounter (Signed)
Patient is scheduled for 08/13/20 with [facility]

## 2020-08-13 ENCOUNTER — Other Ambulatory Visit (INDEPENDENT_AMBULATORY_CARE_PROVIDER_SITE_OTHER): Payer: Self-pay | Admitting: Family Medicine

## 2020-08-13 ENCOUNTER — Encounter (INDEPENDENT_AMBULATORY_CARE_PROVIDER_SITE_OTHER): Payer: Self-pay | Admitting: Family Medicine

## 2020-08-13 ENCOUNTER — Other Ambulatory Visit: Payer: Self-pay

## 2020-08-13 ENCOUNTER — Ambulatory Visit (INDEPENDENT_AMBULATORY_CARE_PROVIDER_SITE_OTHER): Payer: 59 | Admitting: Family Medicine

## 2020-08-13 VITALS — BP 124/76 | HR 66 | Temp 97.8°F | Ht 62.0 in | Wt 174.0 lb

## 2020-08-13 DIAGNOSIS — Z6832 Body mass index (BMI) 32.0-32.9, adult: Secondary | ICD-10-CM | POA: Diagnosis not present

## 2020-08-13 DIAGNOSIS — I10 Essential (primary) hypertension: Secondary | ICD-10-CM | POA: Diagnosis not present

## 2020-08-13 DIAGNOSIS — E669 Obesity, unspecified: Secondary | ICD-10-CM

## 2020-08-13 DIAGNOSIS — E559 Vitamin D deficiency, unspecified: Secondary | ICD-10-CM

## 2020-08-13 DIAGNOSIS — Z9189 Other specified personal risk factors, not elsewhere classified: Secondary | ICD-10-CM

## 2020-08-13 MED ORDER — VITAMIN D (ERGOCALCIFEROL) 1.25 MG (50000 UNIT) PO CAPS: 50000.0000 [IU] | ORAL_CAPSULE | ORAL | 0 refills | Status: DC

## 2020-08-14 ENCOUNTER — Encounter: Payer: Self-pay | Admitting: Obstetrics & Gynecology

## 2020-08-14 ENCOUNTER — Ambulatory Visit (INDEPENDENT_AMBULATORY_CARE_PROVIDER_SITE_OTHER): Payer: 59 | Admitting: Obstetrics & Gynecology

## 2020-08-14 VITALS — BP 130/80 | Ht 62.0 in | Wt 178.0 lb

## 2020-08-14 DIAGNOSIS — D069 Carcinoma in situ of cervix, unspecified: Secondary | ICD-10-CM | POA: Diagnosis not present

## 2020-08-14 NOTE — Patient Instructions (Signed)
Cryoablation Cryoablation is a procedure used to remove abnormal growths or cancerous tissue. This is done by freezing the growth or tissue with either liquid nitrogen or argon gas. This procedure is also known as cryotherapy or cryosurgery. This procedure may be done to treat many conditions, including:  Skin tumors.  Non-cancerous (benign) knots of tissue called nodules.  A type of eye cancer (retinoblastoma).  Cancers of the prostate, liver, kidney, cervix, lung, and bone. Tell a health care provider about:  Any allergies you have.  All medicines you are taking, including vitamins, herbs, eye drops, creams, and over-the-counter medicines.  Any problems you or family members have had with anesthetic medicines.  Any blood disorders you have.  Any surgeries you have had.  Any medical conditions you have.  Whether you are pregnant or may be pregnant. What are the risks? Generally, this is a safe procedure. However, problems may occur, including:  Infection.  Bleeding.  Swelling.  Allergic reactions to medicines.  Damage to nearby structures or organs, such as damage to nerves, which may cause numbness. This is rare. What happens before the procedure? Staying hydrated Follow instructions from your health care provider about hydration, which may include:  Up to 2 hours before the procedure - you may continue to drink clear liquids, such as water, clear fruit juice, black coffee, and plain tea.   Eating and drinking restrictions Follow instructions from your health care provider about eating and drinking, which may include:  8 hours before the procedure - stop eating heavy meals or foods, such as meat, fried foods, or fatty foods.  6 hours before the procedure - stop eating light meals or foods, such as toast or cereal.  6 hours before the procedure - stop drinking milk or drinks that contain milk.  2 hours before the procedure - stop drinking clear  liquids. Medicines Ask your health care provider about:  Changing or stopping your regular medicines. This is especially important if you are taking diabetes medicines or blood thinners.  Taking medicines such as aspirin and ibuprofen. These medicines can thin your blood. Do not take these medicines unless your health care provider tells you to take them.  Taking over-the-counter medicines, vitamins, herbs, and supplements. Tests You may have tests done, including blood tests and imaging tests. General instructions  A medical history will be taken, and a physical exam will be done.  Do not use any products that contain nicotine or tobacco for at least 4 weeks before the procedure. These products include cigarettes, e-cigarettes, and chewing tobacco. If you need help quitting, ask your health care provider.  Ask your health care provider: ? How your surgery site will be marked. ? What steps will be taken to help prevent infection. These may include:  Removing hair at the surgery site.  Washing skin with a germ-killing soap.  Receiving antibiotic medicine.  Plan to have someone take you home from the hospital or clinic.  If you will be going home right after the procedure, plan to have someone with you for 24 hours. What happens during the procedure?  An IV will be inserted into one of your veins.  You will be given one or more of the following: ? A medicine to help you relax (sedative). ? A medicine to numb the area (local anesthetic). ? A medicine to make you fall asleep (general anesthetic). ? A medicine that is injected into your spine to numb the area below and slightly above the injection site (  spinal anesthetic). ? A medicine that is injected into an area of your body to numb everything below the injection site (regional anesthetic).  A device called a cryoprobe will be used to treat the growth by freezing it. The cryoprobe has liquid nitrogen or argon gas flowing  through it. Depending on how deep in the body the growth is found, the cryoprobe can be: ? Applied directly to the area, such as for skin cancers or nodules. ? Inserted through an incision into the area, such as the prostate. ? Passed through a thin, long tube (endoscope) to reach deeper areas of the body, such as the lungs or liver.  The cryoprobe may be guided using imaging, such as an ultrasound, a CT scan, or an MRI.  Liquid nitrogen or argon gas will be delivered through the cryoprobe to the growth until it is frozen and destroyed.  The process may be repeated on other areas depending on how many areas need treatment.  The cryoprobe will be removed, and pressure will be applied to stop any bleeding.  If an incision was made, it will be closed with stitches (sutures) and covered with a bandage (dressing). The procedure will vary depending on the location of the growth. The procedure may also vary among health care providers and hospitals. What happens after the procedure?  Your blood pressure, heart rate, breathing rate, and blood oxygen level will be monitored until you leave the hospital or clinic.  You will be given medicine to help with pain, nausea, and vomiting as needed.  If you were given a sedative during the procedure, it can affect you for several hours. Do not drive or operate machinery until your health care provider says that it is safe.   Summary  Cryoablation is a procedure used to remove abnormal growths or cancerous tissue. This is done by freezing the growth or tissue with either liquid nitrogen or argon gas.  If you will be going home right after the procedure, plan to have someone with you for 24 hours.  Your health care provider may use a device called a cryoprobe that has liquid nitrogen or argon gas flowing through it. Liquid nitrogen or argon gas will be delivered through the cryoprobe to the growth until it is frozen and destroyed.  The process may be  repeated on other areas depending on how many areas need treatment. This information is not intended to replace advice given to you by your health care provider. Make sure you discuss any questions you have with your health care provider. Document Revised: 02/28/2019 Document Reviewed: 02/28/2019 Elsevier Patient Education  [place].

## 2020-08-14 NOTE — Progress Notes (Signed)
   GYNECOLOGY CLINIC PROCEDURE NOTE  Cryotherapy details  Indication: Pt has a history of CIN 1. Most recent PAP revealed low-grade squamous intraepithelial neoplasia (LGSIL - encompassing HPV,mild dysplasia,CIN I).  The indications for cryotherapy were reviewed with the patient in detail. She was counseled about that efficacy of this procedure, and possible need for excisional procedure in the future if her cervical dysplasia persists.  The risks of the procedure where explained in detail and patient was told to expect a copious amount of discharge in the next few weeks. All her questions were answered, and written informed consent was obtained.  The patient was placed in the dorsal lithotomy position and a vaginal speculum was placed. Her cervix was visualized and patient was noted to have had normal size transformation zone. The appropriate cryotherapy probes were picked and the more endocervical probe was then affixed to cryotherapy apparatus. Then, nitrogen gas was then activated, the probe was applied to the transformation zone of the cervix. This was kept in place for 2 minutes. The cryotherapy was then stopped and all instruments were removed from the patient's pelvis; a thawing period of 2 minutes was observed.  A second cycle of cryotherapy was then administered to the cervix with the more ectocervical probe for 2 minutes.  Then, the probe was removed.  The patient tolerated the procedure well without any complications. Routine post procedure instructions were given to the patient.  Will repeat pap smear in 6 months and manage accordingly.  Barnett Applebaum, MD, [facility], [facility] 08/14/2020  10:51 AM

## 2020-08-24 NOTE — Progress Notes (Signed)
Chief Complaint:   OBESITY Andrea Jones is here to discuss her progress with her obesity treatment plan along with follow-up of her obesity related diagnoses.   Today's visit was #: 5 Starting weight: 183 lbs Starting date: 06/09/2020 Today's weight: 174 lbs Today's date: 08/13/2020 Total lbs lost to date: 9 lbs Body mass index is 31.83 kg/m.  Total weight loss percentage to date: -4.92%  Interim History:  Andrea Jones is here for a follow up office visit and she is following the meal plan without concerns or issues.  Patient's meal and food recall appears to be accurate and consistent with what is on the plan.  When on plan, her hunger and cravings are well controlled.    Andrea Jones denies hunger and cravings.  She snacks on sugar-free [employer] and dark chocolate.  She has used different calorie-free spices to enhance her dishes.  Plan:  Handouts on homemade spices given today.  Current Meal Plan: the Category 3 Plan for 85% of the time.  Current Exercise Plan: Walking for 20-30 minutes 4 times per week.  Assessment/Plan:   1. Vitamin D deficiency Not at goal. Current vitamin D is 23.3, tested on 06/09/2020. Optimal goal > 50 ng/dL.  Plan is taking vitamin D 50,000 IU weekly.  Has had improvement in energy levels.  Plan: Continue to take prescription Vitamin D @50 ,000 IU every week as prescribed.  Follow-up for routine testing of Vitamin D, at least 2-3 times per year to avoid over-replacement.  - Refill Vitamin D, Ergocalciferol, (DRISDOL) 1.25 MG (50000 UNIT) CAPS capsule; Take 1 capsule (50,000 Units total) by mouth every 7 (seven) days.  Dispense: 4 capsule; Refill: 0  2. Essential hypertension At goal. Medications: Norvasc 10 mg daily.  No issues or concerns.  Plan: Avoid buying foods that are: processed, frozen, or prepackaged to avoid excess salt. We will continue to monitor closely alongside her PCP and/or Specialist.  Regular follow up with PCP and specialists was also  encouraged.   BP Readings from Last 3 Encounters:  08/14/20 130/80  08/13/20 124/76  07/23/20 120/80   Lab Results  Component Value Date   CREATININE 0.94 06/09/2020   3. At risk for activity intolerance Andrea Jones was given approximately 9 minutes of counseling today regarding her increased risk for exercise intolerance.  We discussed patient's specific personal and medical issues that raise our concern.  She was advised of strategies to prevent injury and ways to improve her cardiopulmonary fitness levels slowly over time.  We additionally discussed various fitness trackers and smart phone apps to help motivate the patient to stay on track.   4. Class 1 obesity with serious comorbidity and body mass index (BMI) of 32.0 to 32.9 in adult, unspecified obesity type  Course: Andrea Jones is currently in the action stage of change. As such, her goal is to continue with weight loss efforts.   Nutrition goals: She has agreed to the Category 3 Plan.   Exercise goals: For substantial health benefits, adults should do at least 150 minutes (2 hours and 30 minutes) a week of moderate-intensity, or 75 minutes (1 hour and 15 minutes) a week of vigorous-intensity aerobic physical activity, or an equivalent combination of moderate- and vigorous-intensity aerobic activity. Aerobic activity should be performed in episodes of at least 10 minutes, and preferably, it should be spread throughout the week.  Behavioral modification strategies: meal planning and cooking strategies and better snacking choices.  Andrea Jones has agreed to follow-up with our clinic in  3 weeks. She was informed of the importance of frequent follow-up visits to maximize her success with intensive lifestyle modifications for her multiple health conditions.   Objective:   Blood pressure 124/76, pulse 66, temperature 97.8 F (36.6 C), height 5\' 2"  (1.575 m), weight 174 lb (78.9 kg), SpO2 96 %. Body mass index is 31.83 kg/m.  General: Cooperative,  alert, well developed, in no acute distress. HEENT: Conjunctivae and lids unremarkable. Cardiovascular: Regular rhythm.  Lungs: Normal work of breathing. Neurologic: No focal deficits.   Lab Results  Component Value Date   CREATININE 0.94 06/09/2020   BUN 12 06/09/2020   NA 140 06/09/2020   K 4.0 06/09/2020   CL 102 06/09/2020   CO2 23 06/09/2020   Lab Results  Component Value Date   ALT 47 (H) 06/09/2020   AST 30 06/09/2020   ALKPHOS 60 06/09/2020   BILITOT 0.5 06/09/2020   Lab Results  Component Value Date   HGBA1C 5.5 06/09/2020   HGBA1C 5.5 02/25/2019   Lab Results  Component Value Date   INSULIN 21.1 06/09/2020   Lab Results  Component Value Date   TSH 1.610 06/09/2020   Lab Results  Component Value Date   CHOL 278 (H) 06/09/2020   HDL 40 06/09/2020   LDLCALC 198 (H) 06/09/2020   TRIG 205 (H) 06/09/2020   CHOLHDL 7.0 (H) 06/09/2020   Lab Results  Component Value Date   WBC 6.7 06/09/2020   HGB 14.8 06/09/2020   HCT 42.7 06/09/2020   MCV 90 06/09/2020   PLT 225 06/09/2020   Attestation Statements:   Reviewed by clinician on day of visit: allergies, medications, problem list, medical history, surgical history, family history, social history, and previous encounter notes.  I, Water quality scientist, CMA, am acting as Location manager for [employer], DO.  I have reviewed the above documentation for accuracy and completeness, and I agree with the above. Marjory Sneddon, D.O.  The [place] was signed into law in 2016 which includes the topic of electronic health records.  This provides immediate access to information in MyChart.  This includes consultation notes, operative notes, office notes, lab results and pathology reports.  If you have any questions about what you read please let us know at your next visit so we can discuss your concerns and take corrective action if need be.  We are right here with you.  No orders of the defined types  were placed in this encounter.   Medications Discontinued During This Encounter  Medication Reason  . HYDROcodone-acetaminophen (NORCO/VICODIN) 5-325 MG tablet   . Vitamin D, Ergocalciferol, (DRISDOL) 1.25 MG (50000 UNIT) CAPS capsule Reorder     Meds ordered this encounter  Medications  . Vitamin D, Ergocalciferol, (DRISDOL) 1.25 MG (50000 UNIT) CAPS capsule    Sig: Take 1 capsule (50,000 Units total) by mouth every 7 (seven) days.    Dispense:  4 capsule    Refill:  0

## 2020-08-31 ENCOUNTER — Encounter (INDEPENDENT_AMBULATORY_CARE_PROVIDER_SITE_OTHER): Payer: Self-pay | Admitting: Family Medicine

## 2020-08-31 ENCOUNTER — Ambulatory Visit (INDEPENDENT_AMBULATORY_CARE_PROVIDER_SITE_OTHER): Payer: 59 | Admitting: Family Medicine

## 2020-08-31 ENCOUNTER — Other Ambulatory Visit: Payer: Self-pay

## 2020-08-31 VITALS — BP 124/72 | HR 61 | Temp 98.2°F | Ht 62.0 in | Wt 175.0 lb

## 2020-08-31 DIAGNOSIS — E559 Vitamin D deficiency, unspecified: Secondary | ICD-10-CM | POA: Diagnosis not present

## 2020-08-31 DIAGNOSIS — Z9189 Other specified personal risk factors, not elsewhere classified: Secondary | ICD-10-CM

## 2020-08-31 DIAGNOSIS — Z6833 Body mass index (BMI) 33.0-33.9, adult: Secondary | ICD-10-CM

## 2020-08-31 DIAGNOSIS — E669 Obesity, unspecified: Secondary | ICD-10-CM

## 2020-08-31 DIAGNOSIS — E8881 Metabolic syndrome: Secondary | ICD-10-CM

## 2020-08-31 MED ORDER — VITAMIN D (ERGOCALCIFEROL) 1.25 MG (50000 UNIT) PO CAPS
50000.0000 [IU] | ORAL_CAPSULE | ORAL | 0 refills | Status: DC
Start: 2020-08-31 — End: 2020-09-28

## 2020-09-08 NOTE — Progress Notes (Signed)
Chief Complaint:   OBESITY Andrea Jones is here to discuss her progress with her obesity treatment plan along with follow-up of her obesity related diagnoses.   Today's visit was #: 6 Starting weight: 183 lbs Starting date: 06/09/2020 Today's weight: 175 lbs Today's date: 08/31/2020 Total lbs lost to date: 8 lbs Body mass index is 32.01 kg/m.  Total weight loss percentage to date: -4.37%  Interim History:  Andrea Jones had cryo surgery on her cervix within the past couple of weeks and decreased her exercise.  Occasionally goes off plan, but rare.  Can be better with calculating snack calories and staying within allotted amount.  Plan:  Will start walking for 30 minutes per day, 2 days per week, with an exercise partner.  She also plans to walk for 30 minutes per day, 3 days per week on her own.  Current Meal Plan: the Category 3 Plan for 80% of the time.  Current Exercise Plan: Walking for 30 minutes 2 times per week.  Assessment/Plan:   No orders of the defined types were placed in this encounter.   Medications Discontinued During This Encounter  Medication Reason  . Vitamin D, Ergocalciferol, (DRISDOL) 1.25 MG (50000 UNIT) CAPS capsule Reorder     Meds ordered this encounter  Medications  . Vitamin D, Ergocalciferol, (DRISDOL) 1.25 MG (50000 UNIT) CAPS capsule    Sig: Take 1 capsule (50,000 Units total) by mouth every 7 (seven) days.    Dispense:  4 capsule    Refill:  0     1. Vitamin D deficiency Not at goal. Current vitamin D is 23.3, tested on 06/09/2020. Optimal goal > 50 ng/dL.  She is taking vitamin D 50,000 IU weekly.  Plan: Continue to take prescription Vitamin D @50 ,000 IU every week as prescribed.  Follow-up for routine testing of Vitamin D, at least 2-3 times per year to avoid over-replacement.  - Refill Vitamin D, Ergocalciferol, (DRISDOL) 1.25 MG (50000 UNIT) CAPS capsule; Take 1 capsule (50,000 Units total) by mouth every 7 (seven) days.  Dispense: 4 capsule;  Refill: 0  2. Insulin resistance Not at goal. Goal is HgbA1c < 5.7, fasting insulin closer to 5.  Medication: None.    Plan:  Discussed labs with patient today.  She will continue to focus on protein-rich, low simple carbohydrate foods. We reviewed the importance of hydration, regular exercise for stress reduction, and restorative sleep.  Handout given again today.  She declines metformin.  She will decrease simple carbohydrates, make sure she measures her proteins, and will not go over in snack calories.  Lab Results  Component Value Date   HGBA1C 5.5 06/09/2020   Lab Results  Component Value Date   INSULIN 21.1 06/09/2020   3. At risk for activity intolerance Andrea Jones was given approximately 9 minutes of counseling today regarding her increased risk for exercise intolerance.  We discussed patient's specific personal and medical issues that raise our concern.  She was advised of strategies to prevent injury and ways to improve her cardiopulmonary fitness levels slowly over time.  We additionally discussed various fitness trackers and smart phone apps to help motivate the patient to stay on track.   4. Obesity, current BMI 32.0  Course: Andrea Jones is currently in the action stage of change. As such, her goal is to continue with weight loss efforts.   Nutrition goals: She has agreed to the Category 3 Plan.   Exercise goals: As is, but increase as tolerated.  Behavioral modification strategies:  increasing lean protein intake, decreasing simple carbohydrates and planning for success.  Andrea Jones has agreed to follow-up with our clinic in 3 weeks. She was informed of the importance of frequent follow-up visits to maximize her success with intensive lifestyle modifications for her multiple health conditions.   Objective:   Blood pressure 124/72, pulse 61, temperature 98.2 F (36.8 C), height 5\' 2"  (1.575 m), weight 175 lb (79.4 kg), SpO2 100 %. Body mass index is 32.01 kg/m.  General:  Cooperative, alert, well developed, in no acute distress. HEENT: Conjunctivae and lids unremarkable. Cardiovascular: Regular rhythm.  Lungs: Normal work of breathing. Neurologic: No focal deficits.   Lab Results  Component Value Date   CREATININE 0.94 06/09/2020   BUN 12 06/09/2020   NA 140 06/09/2020   K 4.0 06/09/2020   CL 102 06/09/2020   CO2 23 06/09/2020   Lab Results  Component Value Date   ALT 47 (H) 06/09/2020   AST 30 06/09/2020   ALKPHOS 60 06/09/2020   BILITOT 0.5 06/09/2020   Lab Results  Component Value Date   HGBA1C 5.5 06/09/2020   HGBA1C 5.5 02/25/2019   Lab Results  Component Value Date   INSULIN 21.1 06/09/2020   Lab Results  Component Value Date   TSH 1.610 06/09/2020   Lab Results  Component Value Date   CHOL 278 (H) 06/09/2020   HDL 40 06/09/2020   LDLCALC 198 (H) 06/09/2020   TRIG 205 (H) 06/09/2020   CHOLHDL 7.0 (H) 06/09/2020   Lab Results  Component Value Date   WBC 6.7 06/09/2020   HGB 14.8 06/09/2020   HCT 42.7 06/09/2020   MCV 90 06/09/2020   PLT 225 06/09/2020   Attestation Statements:   Reviewed by clinician on day of visit: allergies, medications, problem list, medical history, surgical history, family history, social history, and previous encounter notes.  I, Water quality scientist, CMA, am acting as Location manager for [employer], DO.  I have reviewed the above documentation for accuracy and completeness, and I agree with the above. Marjory Sneddon, D.O.  The [place] was signed into law in 2016 which includes the topic of electronic health records.  This provides immediate access to information in MyChart.  This includes consultation notes, operative notes, office notes, lab results and pathology reports.  If you have any questions about what you read please let us know at your next visit so we can discuss your concerns and take corrective action if need be.  We are right here with you.

## 2020-09-28 ENCOUNTER — Other Ambulatory Visit: Payer: Self-pay

## 2020-09-28 ENCOUNTER — Encounter (INDEPENDENT_AMBULATORY_CARE_PROVIDER_SITE_OTHER): Payer: Self-pay | Admitting: Family Medicine

## 2020-09-28 ENCOUNTER — Ambulatory Visit (INDEPENDENT_AMBULATORY_CARE_PROVIDER_SITE_OTHER): Payer: 59 | Admitting: Family Medicine

## 2020-09-28 VITALS — BP 129/85 | HR 65 | Temp 97.7°F | Ht 62.0 in | Wt 175.0 lb

## 2020-09-28 DIAGNOSIS — I10 Essential (primary) hypertension: Secondary | ICD-10-CM | POA: Diagnosis not present

## 2020-09-28 DIAGNOSIS — E559 Vitamin D deficiency, unspecified: Secondary | ICD-10-CM | POA: Diagnosis not present

## 2020-09-28 DIAGNOSIS — E669 Obesity, unspecified: Secondary | ICD-10-CM | POA: Diagnosis not present

## 2020-09-28 DIAGNOSIS — Z9189 Other specified personal risk factors, not elsewhere classified: Secondary | ICD-10-CM | POA: Diagnosis not present

## 2020-09-28 DIAGNOSIS — H1010 Acute atopic conjunctivitis, unspecified eye: Secondary | ICD-10-CM

## 2020-09-28 DIAGNOSIS — Z6833 Body mass index (BMI) 33.0-33.9, adult: Secondary | ICD-10-CM

## 2020-09-28 MED ORDER — VITAMIN D (ERGOCALCIFEROL) 1.25 MG (50000 UNIT) PO CAPS: 50000.0000 [IU] | ORAL_CAPSULE | ORAL | 0 refills | Status: DC

## 2020-09-30 ENCOUNTER — Encounter: Payer: Self-pay | Admitting: Oncology

## 2020-09-30 NOTE — Progress Notes (Signed)
Chief Complaint:   OBESITY Andrea Jones is here to discuss her progress with her obesity treatment plan along with follow-up of her obesity related diagnoses.   Today's visit was #: 7 Starting weight: 183 lbs Starting date: 06/09/2020 Today's weight: 175 lbs Today's date: 09/28/2020 Total lbs lost to date: 8 lbs Body mass index is 32.01 kg/m.  Total weight loss percentage to date: -4.37%  Interim History:  Andrea Jones is here for a follow up office visit and she is following the meal plan without concerns or issues.  Patient's meal and food recall appears to be accurate and consistent with what is on the plan.  When on plan, her hunger and cravings are well controlled.    Andrea Jones is a little bored with the meal plan, she says.  She is having allergic conjunctivitis symptoms.   Current Meal Plan: the Category 3 Plan for 80% of the time.  Current Exercise Plan: Walking for 30 minutes 7 times per week.  Assessment/Plan:   1. Essential hypertension At goal. Medications: Norvasc 5 mg daily.  Changed from 10 mg Norvasc to 5 mg around 4 weeks ago by PCP.  At home, blood pressure is okay when checked.  No side effects or symptoms.  Plan:  Continue Norvasc.  Avoid buying foods that are: processed, frozen, or prepackaged to avoid excess salt. Ambulatory blood pressure monitoring was encouraged with a goal of at least 2-3 times weekly or when feeling poorly.  She was instructed to keep a log for [name] to review at each office visit.   We will continue to monitor closely alongside her PCP and/or Specialist.  Regular follow up with PCP and specialists was also encouraged.   BP Readings from Last 3 Encounters:  09/28/20 129/85  08/31/20 124/72  08/14/20 130/80   Lab Results  Component Value Date   CREATININE 0.94 06/09/2020   2. Vitamin D deficiency Not at goal. Current vitamin D is 23.3, tested on 06/09/2020. Optimal goal > 50 ng/dL.  She is taking vitamin D 50,000 IU weekly.   Plan:  Continue to take prescription Vitamin D @50 ,000 IU every week as prescribed.  Follow-up for routine testing of Vitamin D, at least 2-3 times per year to avoid over-replacement.  - Refill Vitamin D, Ergocalciferol, (DRISDOL) 1.25 MG (50000 UNIT) CAPS capsule; Take 1 capsule (50,000 Units total) by mouth every 7 (seven) days.  Dispense: 4 capsule; Refill: 0  3. Allergic conjunctivitis, unspecified laterality Positive for burning eyes, itchy, watering, especially after being outside in the garden, etc.  Plan:  Trial OTC Patanol/Pataday.  If no improvement, follow-up with her eye doctor.   4. At risk for complication associated with hypotension Jerolene was given approximately 9 minutes of education and counseling today regarding the condition of hypotension, the pathophysiology of the condition and concerns regarding prevention and treatment of this condition.  We discussed risks of medications used to treat hypertension, which in the setting of weight loss, can inadvertently cause hypotension.  Signs of hypotension such as feeling lightheaded or unsteady, esp when getting up first thing in the AM or off the toilet, were reviewed in detail and all questions were answered.  The use of motivational interviewing was employed as a technique as well today to aid in the treatment of patient's multiple conditions.  Pt understands importance of proper hydration and also of more prudent home BP monitoring while actively losing weight.  she will call us, or their PCP,  or other  specialists who treat their BP with medications, with any questions or concerns that may develop.   5. Obesity, current BMI 32.0  Course: [name] is currently in the action stage of change. As such, her goal is to continue with weight loss efforts.   Nutrition goals: She has agreed to keeping a food journal and adhering to recommended goals of 1500-1600 calories and 120+ grams of protein.   Exercise goals: As is.  Behavioral modification  strategies: increasing lean protein intake, decreasing simple carbohydrates, meal planning and cooking strategies, keeping healthy foods in the home and planning for success.  Andrea Jones has agreed to follow-up with our clinic in 2-3 weeks. She was informed of the importance of frequent follow-up visits to maximize her success with intensive lifestyle modifications for her multiple health conditions.   Objective:   Blood pressure 129/85, pulse 65, temperature 97.7 F (36.5 C), height 5\' 2"  (1.575 m), weight 175 lb (79.4 kg), SpO2 97 %. Body mass index is 32.01 kg/m.  General: Cooperative, alert, well developed, in no acute distress. HEENT: Conjunctivae and lids unremarkable. Cardiovascular: Regular rhythm.  Lungs: Normal work of breathing. Neurologic: No focal deficits.   Lab Results  Component Value Date   CREATININE 0.94 06/09/2020   BUN 12 06/09/2020   NA 140 06/09/2020   K 4.0 06/09/2020   CL 102 06/09/2020   CO2 23 06/09/2020   Lab Results  Component Value Date   ALT 47 (H) 06/09/2020   AST 30 06/09/2020   ALKPHOS 60 06/09/2020   BILITOT 0.5 06/09/2020   Lab Results  Component Value Date   HGBA1C 5.5 06/09/2020   HGBA1C 5.5 02/25/2019   Lab Results  Component Value Date   INSULIN 21.1 06/09/2020   Lab Results  Component Value Date   TSH 1.610 06/09/2020   Lab Results  Component Value Date   CHOL 278 (H) 06/09/2020   HDL 40 06/09/2020   LDLCALC 198 (H) 06/09/2020   TRIG 205 (H) 06/09/2020   CHOLHDL 7.0 (H) 06/09/2020   Lab Results  Component Value Date   WBC 6.7 06/09/2020   HGB 14.8 06/09/2020   HCT 42.7 06/09/2020   MCV 90 06/09/2020   PLT 225 06/09/2020   Attestation Statements:   Reviewed by clinician on day of visit: allergies, medications, problem list, medical history, surgical history, family history, social history, and previous encounter notes.  I, Water quality scientist, CMA, am acting as Location manager for [employer], DO.  I have reviewed  the above documentation for accuracy and completeness, and I agree with the above. Marjory Sneddon, D.O.  The [place] was signed into law in 2016 which includes the topic of electronic health records.  This provides immediate access to information in MyChart.  This includes consultation notes, operative notes, office notes, lab results and pathology reports.  If you have any questions about what you read please let us know at your next visit so we can discuss your concerns and take corrective action if need be.  We are right here with you.

## 2020-10-02 ENCOUNTER — Encounter: Payer: Self-pay | Admitting: Oncology

## 2020-10-02 ENCOUNTER — Inpatient Hospital Stay: Payer: 59 | Attending: Oncology | Admitting: Oncology

## 2020-10-02 VITALS — BP 137/83 | HR 65 | Temp 97.2°F | Resp 18 | Wt 177.9 lb

## 2020-10-02 DIAGNOSIS — N6489 Other specified disorders of breast: Secondary | ICD-10-CM | POA: Diagnosis not present

## 2020-10-02 DIAGNOSIS — R232 Flushing: Secondary | ICD-10-CM | POA: Diagnosis not present

## 2020-10-02 DIAGNOSIS — E7800 Pure hypercholesterolemia, unspecified: Secondary | ICD-10-CM | POA: Diagnosis not present

## 2020-10-02 DIAGNOSIS — Z809 Family history of malignant neoplasm, unspecified: Secondary | ICD-10-CM

## 2020-10-02 DIAGNOSIS — R7401 Elevation of levels of liver transaminase levels: Secondary | ICD-10-CM | POA: Diagnosis not present

## 2020-10-02 DIAGNOSIS — D472 Monoclonal gammopathy: Secondary | ICD-10-CM | POA: Insufficient documentation

## 2020-10-02 DIAGNOSIS — L905 Scar conditions and fibrosis of skin: Secondary | ICD-10-CM | POA: Diagnosis not present

## 2020-10-02 DIAGNOSIS — G4700 Insomnia, unspecified: Secondary | ICD-10-CM | POA: Insufficient documentation

## 2020-10-02 DIAGNOSIS — Z79899 Other long term (current) drug therapy: Secondary | ICD-10-CM | POA: Insufficient documentation

## 2020-10-02 DIAGNOSIS — Z803 Family history of malignant neoplasm of breast: Secondary | ICD-10-CM | POA: Diagnosis not present

## 2020-10-02 DIAGNOSIS — E559 Vitamin D deficiency, unspecified: Secondary | ICD-10-CM | POA: Insufficient documentation

## 2020-10-02 DIAGNOSIS — R011 Cardiac murmur, unspecified: Secondary | ICD-10-CM | POA: Diagnosis not present

## 2020-10-02 DIAGNOSIS — K5900 Constipation, unspecified: Secondary | ICD-10-CM | POA: Diagnosis not present

## 2020-10-02 DIAGNOSIS — Z8041 Family history of malignant neoplasm of ovary: Secondary | ICD-10-CM | POA: Insufficient documentation

## 2020-10-02 DIAGNOSIS — E785 Hyperlipidemia, unspecified: Secondary | ICD-10-CM | POA: Diagnosis not present

## 2020-10-02 DIAGNOSIS — R0602 Shortness of breath: Secondary | ICD-10-CM | POA: Diagnosis not present

## 2020-10-02 NOTE — Progress Notes (Signed)
Hematology/Oncology follow up  note [facility] Telephone:(336) [phone] [phone]   Patient Care Team: Leonel Ramsay, MD as PCP - General (Infectious Diseases) [name], MD as PCP - OBGYN (Obstetrics and Gynecology) Ammie Dalton, Okey Regal, MD (Unknown Physician Specialty) Bary Castilla Forest Gleason, MD (General Surgery)  REFERRING PROVIDER: [name], MD  CHIEF COMPLAINTS/REASON FOR VISIT:  Follow-up for MGUS  HISTORY OF PRESENTING ILLNESS:  Andrea Jones is a 60 y.o. female presents for follow-up of MGUS.  neurology work-up for foot numbness and tingling.Marland Kitchen  SPEP showed 0.9 g/dL M spike,   , and IFE showed IgG Kappa monoclonal protein.  No aggravating or alleviated factors.  Associated signs or symptoms:  Neuropathy: Toe numbness and tingling. Denies weight loss, fever chills, bone pain, Bone pain: Denies Anemia denies  Patient also recently had an abnormal screening mammogram on 02/22/2019 and the patient was called back to perform unilateral left diagnostic breast mammogram on 03/06/2019 Showed irregular hypoechoic mass 0.6 x 0.4 x 0.7 cm in the left breast is suspicious for malignancy.  Axillary lymph node status was not commented on mammogram or targeted ultrasound. Patient is status post left breast mass biopsy-03/15/2019, biopsy showed radial scar/complex sclerosing lesion.  # 03/15/2019 left breast biopsy showed radial scar/complex sclerosing lesion  INTERVAL HISTORY Andrea Jones is a 60 y.o. female who has above history reviewed by me today presents for follow up visit for management of abnormal SPEP Problems and complaints are listed below: Patient has no new complaints.  She has blood work done at [facility].  Denies any constitutional symptoms.  Pregnancy / Birth History Age at menarche  64 years. Age of menopause  81-50 Gravida  3 Maternal age  50-25 Para  3   Review of Systems  Constitutional: Negative  for appetite change, chills, fatigue and fever.  HENT:   Negative for hearing loss and voice change.   Eyes: Negative for eye problems.  Respiratory: Negative for chest tightness and cough.   Cardiovascular: Negative for chest pain.  Gastrointestinal: Negative for abdominal distention, abdominal pain and blood in stool.  Endocrine: Negative for hot flashes.  Genitourinary: Negative for difficulty urinating and frequency.   Musculoskeletal: Negative for arthralgias.  Skin: Negative for itching and rash.  Neurological: Negative for extremity weakness.  Hematological: Negative for adenopathy.  Psychiatric/Behavioral: Negative for confusion.     MEDICAL HISTORY:  Past Medical History:  Diagnosis Date  . Anemia   . Anxiety   . Chest pain   . Constipation   . Family history of ovarian cancer    8/19 and 9/21 cancer genetic testing letter sent  . Heart murmur    hx of per pt, "hasn't presented itself in years"  . Heartburn   . High blood pressure   . Hot flashes    "pretty much gone"  . Hypercholesteremia   . Hyperlipidemia   . Insomnia   . Joint pain   . Liver problem   . MGUS (monoclonal gammopathy of unknown significance)    sees Dr. Tasia Catchings with hematology  . MGUS (monoclonal gammopathy of unknown significance)   . Nerve pain    in hands and feet  . Palpitations   . SOB (shortness of breath) on exertion   . Unspecified lump in the left breast, unspecified quadrant    surgery scheduled for 07/15/20  . Vitamin D deficiency 2016    SURGICAL HISTORY: Past Surgical History:  Procedure Laterality Date  . ABDOMINAL  HYSTERECTOMY  2001  . APPENDECTOMY  1982  . BREAST BIOPSY Left 1999   cyst,benign  . BREAST BIOPSY Left 03/15/2019   [name] bx venus clip radial scar  . BREAST LUMPECTOMY WITH RADIOACTIVE SEED LOCALIZATION Left 07/15/2020   Procedure: LEFT BREAST LUMPECTOMY WITH RADIOACTIVE SEED LOCALIZATION;  Surgeon: Jovita Kussmaul, MD;  Location: [facility];  Service:  General;  Laterality: Left;  . CESAREAN SECTION  1985  . COLONOSCOPY  06/13/2012   [facility]. Normal Exam.  . HYSTERECTOMY ABDOMINAL WITH SALPINGECTOMY    . mgus    . OOPHORECTOMY    . OVARY SURGERY  2000    SOCIAL HISTORY: Social History   Socioeconomic History  . Marital status: Married    Spouse name: Not on file  . Number of children: 3  . Years of education: Not on file  . Highest education level: Not on file  Occupational History  . Occupation: Civil engineer, contracting  Tobacco Use  . Smoking status: Never Smoker  . Smokeless tobacco: Never Used  Vaping Use  . Vaping Use: Never used  Substance and Sexual Activity  . Alcohol use: No    Alcohol/week: 0.0 standard drinks  . Drug use: No  . Sexual activity: Not Currently  Other Topics Concern  . Not on file  Social History Narrative   Lives at home with husband    Right handed   Caffeine: maybe 1 cup maybe 3 days a week   Social Determinants of Health   Financial Resource Strain: Not on file  Food Insecurity: Not on file  Transportation Needs: Not on file  Physical Activity: Not on file  Stress: Not on file  Social Connections: Not on file  Intimate Partner Violence: Not on file    FAMILY HISTORY: Family History  Problem Relation Age of Onset  . Colon cancer Father 64  . Heart disease Father   . Cancer Father   . Alcoholism Father   . Congestive Heart Failure Mother   . Emphysema Mother   . Hypertension Mother   . Kidney failure Mother   . Neuropathy Mother   . Hyperlipidemia Mother   . Depression Mother   . Anxiety disorder Mother   . Obesity Mother   . Ovarian cancer Sister 7       pat 1/2 sister  . Breast cancer Cousin 20    ALLERGIES:  has No Known Allergies.  MEDICATIONS:  Current Outpatient Medications  Medication Sig Dispense Refill  . amLODipine (NORVASC) 10 MG tablet Take 1 tablet (10 mg total) by mouth daily. (Patient taking differently: Take 5 mg by mouth daily.)    . Biotin 10000  MCG TABS Take 1 each by mouth daily.    Marland Kitchen CALCIUM PO Take 500 mg by mouth daily.    . Cinnamon 500 MG capsule Take 500 mg by mouth daily.    Marland Kitchen FIBER PO Take by mouth. Dietary fiber- 6 grams soluble fiber- 6 grams Chromium Picolinate- 200 mcg    . Multiple Vitamins-Minerals (WOMENS MULTIVITAMIN PO) Take by mouth.    . Omega-3 Fatty Acids (OMEGA 3 PO) Take 1,200 mg by mouth daily.    Marland Kitchen OVER THE COUNTER MEDICATION daily. IMMUNE PLUS: Vitamin C- 180 mg  Vitamin D- 25 mcg Zinc- 5 mg  Cinnamon 1000 mg Vitamin E 180 mg Turmeric Vital proteins collagen peptides Super food green + probiotics    . Vitamin D, Ergocalciferol, (DRISDOL) 1.25 MG (50000 UNIT) CAPS capsule Take 1  capsule (50,000 Units total) by mouth every 7 (seven) days. 4 capsule 0  . vitamin E 1000 UNIT capsule Take 1,000 Units by mouth daily.     No current facility-administered medications for this visit.     PHYSICAL EXAMINATION: ECOG PERFORMANCE STATUS: 0 - Asymptomatic Vitals:   10/02/20 1306  BP: 137/83  Pulse: 65  Resp: 18  Temp: (!) 97.2 F (36.2 C)   Filed Weights   10/02/20 1306  Weight: 177 lb 14.4 oz (80.7 kg)    Physical Exam Constitutional:      General: She is not in acute distress. HENT:     Head: Normocephalic and atraumatic.  Eyes:     General: No scleral icterus.    Pupils: Pupils are equal, round, and reactive to light.  Cardiovascular:     Rate and Rhythm: Normal rate and regular rhythm.     Heart sounds: Normal heart sounds.  Pulmonary:     Effort: Pulmonary effort is normal. No respiratory distress.     Breath sounds: No wheezing.  Abdominal:     General: Bowel sounds are normal. There is no distension.     Palpations: Abdomen is soft. There is no mass.     Tenderness: There is no abdominal tenderness.  Musculoskeletal:        General: No deformity. Normal range of motion.     Cervical back: Normal range of motion and neck supple.  Skin:    General: Skin is warm and dry.      Findings: No erythema or rash.  Neurological:     Mental Status: She is alert and oriented to person, place, and time.     Cranial Nerves: No cranial nerve deficit.     Coordination: Coordination normal.  Psychiatric:        Behavior: Behavior normal.        Thought Content: Thought content normal.      LABORATORY DATA:  I have reviewed the data as listed Lab Results  Component Value Date   WBC 6.7 06/09/2020   HGB 14.8 06/09/2020   HCT 42.7 06/09/2020   MCV 90 06/09/2020   PLT 225 06/09/2020   Recent Labs    05/05/20 1252 06/09/20 0856  NA 138 140  K 4.0 4.0  CL 105 102  CO2 22 23  GLUCOSE 99 94  BUN 15 12  CREATININE 0.76 0.94  CALCIUM 9.5 10.0  GFRNONAA >60 67  GFRAA  --  77  PROT 8.3* 7.7  ALBUMIN 4.4 4.6  AST 45* 30  ALT 54* 47*  ALKPHOS 49 60  BILITOT 1.1 0.5   Iron/TIBC/Ferritin/ %Sat No results found for: IRON, TIBC, FERRITIN, IRONPCTSAT   09/27/19 M protein 0.9 04/01/2020 M protein 1.1 09/29/2020 M protein 1, IgG 1415, hemoglobin 14.2, creatinine 0.95 RADIOGRAPHIC STUDIES: I have personally reviewed the radiological images as listed and agreed with the findings in the report.  No results found.   ASSESSMENT & PLAN:  1. Radial scar of breast   2. MGUS (monoclonal gammopathy of unknown significance)   3. Family history of cancer   # IgG MGUS Blood work was done through [facility] reviewed and discussed with patient Stable kidney function and stable M protein level at 1.0. Recommend continue follow-up and blood work every 6 months.  [facility] prescription was provided.  #Radial scar/complex sclerosing lesion, status post resection.  Family history of ovarian cancer and breast cancer.  Recommend genetic counseling.   #Transaminitis, mild.  Resolved,  [facility] prescription for next visit in 6 months. All questions were answered. The patient knows to call the clinic with any problems questions or concerns.   Return of visit: 6 months Earlie Server, MD, PhD 10/02/2020

## 2020-10-02 NOTE — Progress Notes (Signed)
Patient denies new problems/concerns today.   °

## 2020-10-05 ENCOUNTER — Telehealth: Payer: Self-pay

## 2020-10-05 DIAGNOSIS — Z809 Family history of malignant neoplasm, unspecified: Secondary | ICD-10-CM

## 2020-10-05 NOTE — Telephone Encounter (Signed)
-----   Message from Earlie Server, MD sent at 10/02/2020  8:38 PM EDT ----- Please let patient know that based on her family history, I recommend genetic testing. If she is interested, please send to genetic counselor.

## 2020-10-05 NOTE — Telephone Encounter (Signed)
MyChart message sent to patient.

## 2020-10-06 NOTE — Telephone Encounter (Addendum)
Left message informing patient of genetic counselor referral.  Referral entered.

## 2020-10-19 ENCOUNTER — Ambulatory Visit (INDEPENDENT_AMBULATORY_CARE_PROVIDER_SITE_OTHER): Payer: 59 | Admitting: Family Medicine

## 2020-10-19 ENCOUNTER — Encounter (INDEPENDENT_AMBULATORY_CARE_PROVIDER_SITE_OTHER): Payer: Self-pay | Admitting: Family Medicine

## 2020-10-19 ENCOUNTER — Other Ambulatory Visit: Payer: Self-pay

## 2020-10-19 VITALS — BP 119/75 | HR 66 | Temp 97.9°F | Ht 62.0 in | Wt 173.0 lb

## 2020-10-19 DIAGNOSIS — Z6833 Body mass index (BMI) 33.0-33.9, adult: Secondary | ICD-10-CM

## 2020-10-19 DIAGNOSIS — E559 Vitamin D deficiency, unspecified: Secondary | ICD-10-CM

## 2020-10-19 DIAGNOSIS — E8881 Metabolic syndrome: Secondary | ICD-10-CM

## 2020-10-19 DIAGNOSIS — E669 Obesity, unspecified: Secondary | ICD-10-CM

## 2020-10-19 DIAGNOSIS — Z9189 Other specified personal risk factors, not elsewhere classified: Secondary | ICD-10-CM

## 2020-10-19 DIAGNOSIS — H1010 Acute atopic conjunctivitis, unspecified eye: Secondary | ICD-10-CM | POA: Diagnosis not present

## 2020-10-19 MED ORDER — VITAMIN D (ERGOCALCIFEROL) 1.25 MG (50000 UNIT) PO CAPS: 50000.0000 [IU] | ORAL_CAPSULE | ORAL | 0 refills | Status: DC

## 2020-10-22 ENCOUNTER — Inpatient Hospital Stay: Payer: 59

## 2020-10-22 ENCOUNTER — Inpatient Hospital Stay: Payer: 59 | Attending: Oncology | Admitting: Licensed Clinical Social Worker

## 2020-10-22 ENCOUNTER — Other Ambulatory Visit: Payer: Self-pay

## 2020-10-22 ENCOUNTER — Encounter: Payer: Self-pay | Admitting: Licensed Clinical Social Worker

## 2020-10-22 DIAGNOSIS — Z803 Family history of malignant neoplasm of breast: Secondary | ICD-10-CM

## 2020-10-22 DIAGNOSIS — Z8041 Family history of malignant neoplasm of ovary: Secondary | ICD-10-CM | POA: Diagnosis not present

## 2020-10-22 DIAGNOSIS — Z800 Family history of malignant neoplasm of digestive organs: Secondary | ICD-10-CM

## 2020-10-22 NOTE — Progress Notes (Signed)
REFERRING PROVIDER: Earlie Server, MD [place],  [place] [address]  PRIMARY PROVIDER:  Leonel Ramsay, MD  PRIMARY REASON FOR VISIT:  1. Family history of ovarian cancer   2. Family history of colon cancer   3. Family history of breast cancer      HISTORY OF PRESENT ILLNESS:   Andrea Jones, a 60 y.o. female, was seen for a [place] cancer genetics consultation at the request of Dr. Tasia Catchings due to a family history of cancer.  Andrea Jones presents to clinic today to discuss the possibility of a hereditary predisposition to cancer, genetic testing, and to further clarify her future cancer risks, as well as potential cancer risks for family members.   Andrea Jones is a 60 y.o. female with no personal history of cancer.    CANCER HISTORY:  Oncology History   No history exists.     RISK FACTORS:  Menarche was at age 89.  First live birth at age 37.  OCP use for approximately 2 years.  Ovaries intact: no.  Hysterectomy: yes.  Menopausal status: postmenopausal.  HRT use: 0 years. Colonoscopy: yes; normal. Mammogram within the last year: yes. Number of breast biopsies: 1. Up to date with pelvic exams: yes. Any excessive radiation exposure in the past: no  Past Medical History:  Diagnosis Date  . Anemia   . Anxiety   . Chest pain   . Constipation   . Family history of breast cancer   . Family history of colon cancer   . Family history of ovarian cancer    8/19 and 9/21 cancer genetic testing letter sent  . Family history of ovarian cancer   . Heart murmur    hx of per pt, "hasn't presented itself in years"  . Heartburn   . High blood pressure   . Hot flashes    "pretty much gone"  . Hypercholesteremia   . Hyperlipidemia   . Insomnia   . Joint pain   . Liver problem   . MGUS (monoclonal gammopathy of unknown significance)    sees Dr. Tasia Catchings with hematology  . MGUS (monoclonal gammopathy of unknown significance)   . Nerve pain    in hands and feet  .  Palpitations   . SOB (shortness of breath) on exertion   . Unspecified lump in the left breast, unspecified quadrant    surgery scheduled for 07/15/20  . Vitamin D deficiency 2016    Past Surgical History:  Procedure Laterality Date  . ABDOMINAL HYSTERECTOMY  2001  . APPENDECTOMY  1982  . BREAST BIOPSY Left 1999   cyst,benign  . BREAST BIOPSY Left 03/15/2019   [name] bx venus clip radial scar  . BREAST LUMPECTOMY WITH RADIOACTIVE SEED LOCALIZATION Left 07/15/2020   Procedure: LEFT BREAST LUMPECTOMY WITH RADIOACTIVE SEED LOCALIZATION;  Surgeon: Jovita Kussmaul, MD;  Location: [facility];  Service: General;  Laterality: Left;  . CESAREAN SECTION  1985  . COLONOSCOPY  06/13/2012   [facility]. Normal Exam.  . HYSTERECTOMY ABDOMINAL WITH SALPINGECTOMY    . mgus    . OOPHORECTOMY    . OVARY SURGERY  2000    Social History   Socioeconomic History  . Marital status: Married    Spouse name: Not on file  . Number of children: 3  . Years of education: Not on file  . Highest education level: Not on file  Occupational History  . Occupation: Civil engineer, contracting  Tobacco Use  .  Smoking status: Never Smoker  . Smokeless tobacco: Never Used  Vaping Use  . Vaping Use: Never used  Substance and Sexual Activity  . Alcohol use: No    Alcohol/week: 0.0 standard drinks  . Drug use: No  . Sexual activity: Not Currently  Other Topics Concern  . Not on file  Social History Narrative   Lives at home with husband    Right handed   Caffeine: maybe 1 cup maybe 3 days a week   Social Determinants of Health   Financial Resource Strain: Not on file  Food Insecurity: Not on file  Transportation Needs: Not on file  Physical Activity: Not on file  Stress: Not on file  Social Connections: Not on file     FAMILY HISTORY:  We obtained a detailed, 4-generation family history.  Significant diagnoses are listed below: Family History  Problem Relation Age of Onset  . Colon cancer  Father 89  . Heart disease Father   . Cancer Father   . Alcoholism Father   . Congestive Heart Failure Mother   . Emphysema Mother   . Hypertension Mother   . Kidney failure Mother   . Neuropathy Mother   . Hyperlipidemia Mother   . Depression Mother   . Anxiety disorder Mother   . Obesity Mother   . Ovarian cancer Sister 73       pat 1/2 sister  . Breast cancer Cousin 76  . Breast cancer Cousin    Andrea Jones has a son and 2 daughters, no cancers. She has 2 paternal half brothers, 2 paternal half sisters. One of these sisters had ovarian cancer in her 80s-50s and did not pass from it, and died at 73.   Andrea Jones's father had colon cancer at 38 and died in his 42s. Patient had approximately 3 paternal uncles, 1 aunt. None of these individuals have had cancer to the patient's knowledge, and no cancers in paternal cousins or grandparents.   Andrea Jones's mother died in her 44s, no cancer history. Patient had 4 maternal aunts, 2 uncles, no cancers. Two maternal cousins had breast cancer, one diagnosed in her 36s and died in her 58s and the other diagnosed in her 30s and is living in her 71s. No known cancers in maternal grandparents.   Andrea Jones is unaware of previous family history of genetic testing for hereditary cancer risks. Patient's maternal ancestors are of Irish/unk descent, and paternal ancestors are of Irish/unk descent. There is no reported Ashkenazi Jewish ancestry. There is no known consanguinity.    GENETIC COUNSELING ASSESSMENT: Andrea Jones is a 60 y.o. female with a family history of breast and ovarian cancer which is somewhat suggestive of a hereditary cancer syndrome and predisposition to cancer. We, therefore, discussed and recommended the following at today's visit.   DISCUSSION: We discussed that approximately 5-10% of cancer is hereditary  Most cases of hereditary breast/ovarian cancer are associated with BRCA1/BRCA2 genes, although there are other genes associated with  hereditary ovarian cancer as well including Lynch syndrome .  We discussed that testing is beneficial for several reasons including knowing about cancer risks, identifying potential screening and risk-reduction options that may be appropriate, and to understand if other family members could be at risk for cancer and allow them to undergo genetic testing.   We reviewed the characteristics, features and inheritance patterns of hereditary cancer syndromes. We also discussed genetic testing, including the appropriate family members to test, the process of testing, insurance coverage and  turn-around-time for results. We discussed the implications of a negative, positive and/or variant of uncertain significant result. We recommended Andrea Jones pursue genetic testing for the [facility] Multi-Cancer+RNA gene panel.   The Multi-Cancer Panel + RNA offered by [facility] includes sequencing and/or deletion duplication testing of the following 84 genes: AIP, ALK, APC, ATM, AXIN2,BAP1,  BARD1, BLM, BMPR1A, BRCA1, BRCA2, BRIP1, CASR, CDC73, CDH1, CDK4, CDKN1B, CDKN1C, CDKN2A (p14ARF), CDKN2A (p16INK4a), CEBPA, CHEK2, CTNNA1, DICER1, DIS3L2, EGFR (c.2369C>T, p.Thr790Met variant only), EPCAM (Deletion/duplication testing only), FH, FLCN, GATA2, GPC3, GREM1 (Promoter region deletion/duplication testing only), HOXB13 (c.251G>A, p.Gly84Glu), HRAS, KIT, MAX, MEN1, MET, MITF (c.952G>A, p.Glu318Lys variant only), MLH1, MSH2, MSH3, MSH6, MUTYH, NBN, NF1, NF2, NTHL1, PALB2, PDGFRA, PHOX2B, PMS2, POLD1, POLE, POT1, PRKAR1A, PTCH1, PTEN, RAD50, RAD51C, RAD51D, RB1, RECQL4, RET, RUNX1, SDHAF2, SDHA (sequence changes only), SDHB, SDHC, SDHD, SMAD4, SMARCA4, SMARCB1, SMARCE1, STK11, SUFU, TERC, TERT, TMEM127, TP53, TSC1, TSC2, VHL, WRN and WT1.  Based on Andrea Jones's family history of cancer, she meets medical criteria for genetic testing. Despite that she meets criteria, she may still have an out of pocket cost. We discussed that if her out of  pocket cost for testing is over $100, the laboratory will call and confirm whether she wants to proceed with testing.  If the out of pocket cost of testing is less than $100 she will be billed by the genetic testing laboratory.   PLAN: After considering the risks, benefits, and limitations, Andrea Jones provided informed consent to pursue genetic testing and the blood sample was sent to [facility] for analysis of the Multi-Cancer Panel+RNA. Results should be available within approximately 2-3 weeks' time, at which point they will be disclosed by telephone to Andrea Jones, as will any additional recommendations warranted by these results. Andrea Jones will receive a summary of her genetic counseling visit and a copy of her results once available. This information will also be available in Epic.   Andrea Jones questions were answered to her satisfaction today. Our contact information was provided should additional questions or concerns arise. Thank you for the referral and allowing [name] to share in the care of your patient.   Faith Rogue, MS, [facility] Genetic Counselor [email or web address] Phone: [phone]  The patient was seen for a total of 30 minutes in face-to-face genetic counseling.  Dr. Grayland Ormond was available for discussion regarding this case.   _______________________________________________________________________ For Office Staff:  Number of people involved in session: 1 Was an Intern/ student involved with case: no

## 2020-10-22 NOTE — Progress Notes (Signed)
Chief Complaint:   OBESITY Andrea Jones is here to discuss her progress with her obesity treatment plan along with follow-up of her obesity related diagnoses.   Today's visit was #: 8 Starting weight: 183 lbs Starting date: 06/09/2020 Today's weight: 173 lbs Today's date: 10/19/2020 Weight change since last visit: 2 lbs Total lbs lost to date: 10 lbs Body mass index is 31.64 kg/m.  Total weight loss percentage to date: -5.46%  Interim History:  Andrea Jones likes journaling a lot.  Hits her protein goals 99% of the time.  Hits her calorie goals 90-95% of the time.  She is walking every morning.  Plan:  Increase water intake from 50 ounces per day to 80 ounces per day.  Current Meal Plan: keeping a food journal and adhering to recommended goals of 1500-1600 calories and 120 grams of protein for 99% of the time.  Current Exercise Plan: Walking for 30-60 minutes 7 times per week.  Assessment/Plan:   Medications Discontinued During This Encounter  Medication Reason  . Vitamin D, Ergocalciferol, (DRISDOL) 1.25 MG (50000 UNIT) CAPS capsule Reorder     Meds ordered this encounter  Medications  . Vitamin D, Ergocalciferol, (DRISDOL) 1.25 MG (50000 UNIT) CAPS capsule    Sig: Take 1 capsule (50,000 Units total) by mouth every 7 (seven) days.    Dispense:  4 capsule    Refill:  0    1. Allergic conjunctivitis, unspecified laterality Patanol works great.  Symptoms are well controlled.  2. Insulin resistance Not at goal. Goal is HgbA1c < 5.7, fasting insulin closer to 5.  Medication: None.  Gave handouts and extensive counseling in the past.  Plan:  She will continue to focus on protein-rich, low simple carbohydrate foods. We reviewed the importance of hydration, regular exercise for stress reduction, and restorative sleep.  Discussion regarding the importance of prudent nutritional plan and weight loss to control insulin resistance.  Discussed medications, and she will consider it and let me  know at her next office visit if she is interested in starting them.  She declines injectables.   Lab Results  Component Value Date   HGBA1C 5.5 06/09/2020   Lab Results  Component Value Date   INSULIN 21.1 06/09/2020   3. Vitamin D deficiency Not at goal. Current vitamin D is 23.3, tested on 06/09/2020. Optimal goal > 50 ng/dL.  She is taking vitamin D 50,000 IU weekly.  Has had improved energy lately.  Plan: Continue to take prescription Vitamin D @50 ,000 IU every week as prescribed.  Follow-up for routine testing of Vitamin D, at least 2-3 times per year to avoid over-replacement.  - Refill Vitamin D, Ergocalciferol, (DRISDOL) 1.25 MG (50000 UNIT) CAPS capsule; Take 1 capsule (50,000 Units total) by mouth every 7 (seven) days.  Dispense: 4 capsule; Refill: 0  4. At risk for dehydration Andrea Jones is at higher than average risk of dehydration.  Andrea Jones was given more than 9 minutes of proper hydration counseling today.  We discussed the signs and symptoms of dehydration, some of which may include muscle cramping, constipation or even orthostatic symptoms.  Counseling on the prevention of dehydration was also provided today.  Andrea Jones is at risk for dehydration due to weight loss, lifestyle and behavorial habits and possibly due to taking certain medication(s).  She was encouraged to adequately hydrate and monitor fluid status to avoid dehydration as well as weight loss plateaus.  Unless pre-existing renal or cardiopulmonary conditions exist, in which patient was told to limit  their fluid intake, I recommended roughly one half of their weight in pounds to be the approximate ounces of non-caloric, non-caffeinated beverages they should drink per day; including more if they are engaging in exercise.  5. Obesity, current BMI 31.8  Course: [name] is currently in the action stage of change. As such, her goal is to continue with weight loss efforts.   Nutrition goals: She has agreed to keeping a food  journal and adhering to recommended goals of 1500-1600 calories and 120 grams of protein.   Exercise goals: As is.  Behavioral modification strategies: increasing water intake and keeping a strict food journal.  Andrea Jones has agreed to follow-up with our clinic in 2-3 weeks. She was informed of the importance of frequent follow-up visits to maximize her success with intensive lifestyle modifications for her multiple health conditions.   Objective:   Blood pressure 119/75, pulse 66, temperature 97.9 F (36.6 C), height 5\' 2"  (1.575 m), weight 173 lb (78.5 kg), SpO2 97 %. Body mass index is 31.64 kg/m.  General: Cooperative, alert, well developed, in no acute distress. HEENT: Conjunctivae and lids unremarkable. Cardiovascular: Regular rhythm.  Lungs: Normal work of breathing. Neurologic: No focal deficits.   Lab Results  Component Value Date   CREATININE 0.94 06/09/2020   BUN 12 06/09/2020   NA 140 06/09/2020   K 4.0 06/09/2020   CL 102 06/09/2020   CO2 23 06/09/2020   Lab Results  Component Value Date   ALT 47 (H) 06/09/2020   AST 30 06/09/2020   ALKPHOS 60 06/09/2020   BILITOT 0.5 06/09/2020   Lab Results  Component Value Date   HGBA1C 5.5 06/09/2020   HGBA1C 5.5 02/25/2019   Lab Results  Component Value Date   INSULIN 21.1 06/09/2020   Lab Results  Component Value Date   TSH 1.610 06/09/2020   Lab Results  Component Value Date   CHOL 278 (H) 06/09/2020   HDL 40 06/09/2020   LDLCALC 198 (H) 06/09/2020   TRIG 205 (H) 06/09/2020   CHOLHDL 7.0 (H) 06/09/2020   Lab Results  Component Value Date   WBC 6.7 06/09/2020   HGB 14.8 06/09/2020   HCT 42.7 06/09/2020   MCV 90 06/09/2020   PLT 225 06/09/2020   Attestation Statements:   Reviewed by clinician on day of visit: allergies, medications, problem list, medical history, surgical history, family history, social history, and previous encounter notes.  I, Water quality scientist, CMA, am acting as Location manager for  [employer], DO.  I have reviewed the above documentation for accuracy and completeness, and I agree with the above. Marjory Sneddon, D.O.  The [place] was signed into law in 2016 which includes the topic of electronic health records.  This provides immediate access to information in MyChart.  This includes consultation notes, operative notes, office notes, lab results and pathology reports.  If you have any questions about what you read please let us know at your next visit so we can discuss your concerns and take corrective action if need be.  We are right here with you.

## 2020-11-09 ENCOUNTER — Ambulatory Visit (INDEPENDENT_AMBULATORY_CARE_PROVIDER_SITE_OTHER): Payer: 59 | Admitting: Family Medicine

## 2020-11-11 ENCOUNTER — Ambulatory Visit (INDEPENDENT_AMBULATORY_CARE_PROVIDER_SITE_OTHER): Payer: 59 | Admitting: Family Medicine

## 2020-11-11 ENCOUNTER — Other Ambulatory Visit: Payer: Self-pay

## 2020-11-11 ENCOUNTER — Encounter (INDEPENDENT_AMBULATORY_CARE_PROVIDER_SITE_OTHER): Payer: Self-pay | Admitting: Family Medicine

## 2020-11-11 VITALS — BP 122/75 | HR 66 | Temp 97.6°F | Ht 62.0 in | Wt 169.0 lb

## 2020-11-11 DIAGNOSIS — E669 Obesity, unspecified: Secondary | ICD-10-CM

## 2020-11-11 DIAGNOSIS — Z6833 Body mass index (BMI) 33.0-33.9, adult: Secondary | ICD-10-CM | POA: Diagnosis not present

## 2020-11-11 DIAGNOSIS — E559 Vitamin D deficiency, unspecified: Secondary | ICD-10-CM

## 2020-11-11 DIAGNOSIS — Z9189 Other specified personal risk factors, not elsewhere classified: Secondary | ICD-10-CM

## 2020-11-11 DIAGNOSIS — I10 Essential (primary) hypertension: Secondary | ICD-10-CM

## 2020-11-11 MED ORDER — VITAMIN D (ERGOCALCIFEROL) 1.25 MG (50000 UNIT) PO CAPS: 50000.0000 [IU] | ORAL_CAPSULE | ORAL | 0 refills | Status: DC

## 2020-11-12 ENCOUNTER — Telehealth: Payer: Self-pay | Admitting: Licensed Clinical Social Worker

## 2020-11-12 ENCOUNTER — Encounter: Payer: Self-pay | Admitting: Licensed Clinical Social Worker

## 2020-11-12 ENCOUNTER — Ambulatory Visit: Payer: Self-pay | Admitting: Licensed Clinical Social Worker

## 2020-11-12 DIAGNOSIS — Z8041 Family history of malignant neoplasm of ovary: Secondary | ICD-10-CM

## 2020-11-12 DIAGNOSIS — Z1379 Encounter for other screening for genetic and chromosomal anomalies: Secondary | ICD-10-CM | POA: Insufficient documentation

## 2020-11-12 DIAGNOSIS — Z803 Family history of malignant neoplasm of breast: Secondary | ICD-10-CM

## 2020-11-12 DIAGNOSIS — Z800 Family history of malignant neoplasm of digestive organs: Secondary | ICD-10-CM

## 2020-11-12 NOTE — Progress Notes (Signed)
HPI:  Andrea Jones was previously seen in [facility] due to a family history of cancer and concerns regarding a hereditary predisposition to cancer. Please refer to our prior cancer genetics clinic note for more information regarding our discussion, assessment and recommendations, at the time. Andrea Jones recent genetic test results were disclosed to her, as were recommendations warranted by these results. These results and recommendations are discussed in more detail below.  CANCER HISTORY:  Oncology History   No history exists.    FAMILY HISTORY:  We obtained a detailed, 4-generation family history.  Significant diagnoses are listed below: Family History  Problem Relation Age of Onset   Colon cancer Father 33   Heart disease Father    Cancer Father    Alcoholism Father    Congestive Heart Failure Mother    Emphysema Mother    Hypertension Mother    Kidney failure Mother    Neuropathy Mother    Hyperlipidemia Mother    Depression Mother    Anxiety disorder Mother    Obesity Mother    Ovarian cancer Sister 36       pat 1/2 sister   Breast cancer Cousin 23   Breast cancer Cousin     Andrea Jones has a son and 2 daughters, no cancers. She has 2 paternal half brothers, 2 paternal half sisters. One of these sisters had ovarian cancer in her 65s-50s and did not pass from it, and died at 58.   Andrea Jones's father had colon cancer at 53 and died in his 75s. Patient had approximately 3 paternal uncles, 1 aunt. None of these individuals have had cancer to the patient's knowledge, and no cancers in paternal cousins or grandparents.   Andrea Jones's mother died in her 29s, no cancer history. Patient had 4 maternal aunts, 2 uncles, no cancers. Two maternal cousins had breast cancer, one diagnosed in her 105s and died in her 107s and the other diagnosed in her 28s and is living in her 69s. No known cancers in maternal grandparents.    Andrea Jones is unaware of previous family  history of genetic testing for hereditary cancer risks. Patient's maternal ancestors are of Irish/unk descent, and paternal ancestors are of Irish/unk descent. There is no reported Ashkenazi Jewish ancestry. There is no known consanguinity.     GENETIC TEST RESULTS: Genetic testing reported out on 11/11/2020 through the [facility] Multi-Cancer +RNA cancer panel found no pathogenic mutations.   The Multi-Cancer Panel + RNA offered by [facility] includes sequencing and/or deletion duplication testing of the following 84 genes: AIP, ALK, APC, ATM, AXIN2,BAP1,  BARD1, BLM, BMPR1A, BRCA1, BRCA2, BRIP1, CASR, CDC73, CDH1, CDK4, CDKN1B, CDKN1C, CDKN2A (p14ARF), CDKN2A (p16INK4a), CEBPA, CHEK2, CTNNA1, DICER1, DIS3L2, EGFR (c.2369C>T, p.Thr790Met variant only), EPCAM (Deletion/duplication testing only), FH, FLCN, GATA2, GPC3, GREM1 (Promoter region deletion/duplication testing only), HOXB13 (c.251G>A, p.Gly84Glu), HRAS, KIT, MAX, MEN1, MET, MITF (c.952G>A, p.Glu318Lys variant only), MLH1, MSH2, MSH3, MSH6, MUTYH, NBN, NF1, NF2, NTHL1, PALB2, PDGFRA, PHOX2B, PMS2, POLD1, POLE, POT1, PRKAR1A, PTCH1, PTEN, RAD50, RAD51C, RAD51D, RB1, RECQL4, RET, RUNX1, SDHAF2, SDHA (sequence changes only), SDHB, SDHC, SDHD, SMAD4, SMARCA4, SMARCB1, SMARCE1, STK11, SUFU, TERC, TERT, TMEM127, TP53, TSC1, TSC2, VHL, WRN and WT1.   The test report has been scanned into EPIC and is located under the Molecular Pathology section of the Results Review tab.  A portion of the result report is included below for reference.     We discussed that because current genetic testing is  not perfect, it is possible there may be a gene mutation in one of these genes that current testing cannot detect, but that chance is small.  There could be another gene that has not yet been discovered, or that we have not yet tested, that is responsible for the cancer diagnoses in the family. It is also possible there is a hereditary cause for the cancer in the family that  Andrea Jones did not inherit and therefore was not identified in her testing.  Therefore, it is important to remain in touch with cancer genetics in the future so that we can continue to offer Andrea Jones the most up to date genetic testing.   ADDITIONAL GENETIC TESTING: We discussed with Andrea Jones that her genetic testing was fairly extensive.  If there are genes identified to increase cancer risk that can be analyzed in the future, we would be happy to discuss and coordinate this testing at that time.    CANCER SCREENING RECOMMENDATIONS: Andrea Jones test result is considered negative (normal).  This means that we have not identified a hereditary cause for her family history of cancer at this time.   While reassuring, this does not definitively rule out a hereditary predisposition to cancer. It is still possible that there could be genetic mutations that are undetectable by current technology. There could be genetic mutations in genes that have not been tested or identified to increase cancer risk.  Therefore, it is recommended she continue to follow the cancer management and screening guidelines provided by her primary healthcare provider.   An individual's cancer risk and medical management are not determined by genetic test results alone. Overall cancer risk assessment incorporates additional factors, including personal medical history, family history, and any available genetic information that may result in a personalized plan for cancer prevention and surveillance.  Based on Andrea Jones's personal and family history of cancer as well as her genetic test results, risk model Harriett Rush was used to estimate her risk of developing breast cancer. This estimates her lifetime risk of developing breast cancer to be approximately 4.8%.  The patient's lifetime breast cancer risk is a preliminary estimate based on available information using one of several models endorsed by the [place] (ACS). The  ACS recommends consideration of breast MRI screening as an adjunct to mammography for patients at high risk (defined as 20% or greater lifetime risk).     RECOMMENDATIONS FOR FAMILY MEMBERS:  Relatives in this family might be at some increased risk of developing cancer, over the general population risk, simply due to the family history of cancer.  We recommended female relatives in this family have a yearly mammogram beginning at age 77, or 71 years younger than the earliest onset of cancer, an annual clinical breast exam, and perform monthly breast self-exams. Female relatives in this family should also have a gynecological exam as recommended by their primary provider.  All family members should be referred for colonoscopy starting at age 84.   It is also possible there is a hereditary cause for the cancer in Andrea Jones's family that she did not inherit and therefore was not identified in her.  Based on Andrea Jones's family history, we recommended those related to her half sister who had ovarian cancer and maternal relatives related to her cousin with breast in her 20s have genetic counseling and testing. Andrea Jones will let us know if we can be of any assistance in coordinating genetic counseling and/or testing for these  family members.  FOLLOW-UP: Lastly, we discussed with Andrea Jones that cancer genetics is a rapidly advancing field and it is possible that new genetic tests will be appropriate for her and/or her family members in the future. We encouraged her to remain in contact with cancer genetics on an annual basis so we can update her personal and family histories and let her know of advances in cancer genetics that may benefit this family.   Our contact number was provided. Andrea Jones questions were answered to her satisfaction, and she knows she is welcome to call us at anytime with additional questions or concerns.   Faith Rogue, MS, [facility] Genetic Counselor [email or web address] Phone:  [phone]

## 2020-11-12 NOTE — Telephone Encounter (Signed)
Revealed negative genetic testing.  This normal result is reassuring.  It is unlikely that there is an increased risk of cancer due to a mutation in one of these genes.  However, genetic testing is not perfect, and cannot definitively rule out a hereditary cause.  It will be important for her to keep in contact with genetics to learn if any additional testing may be needed in the future.      

## 2020-11-18 NOTE — Progress Notes (Signed)
Chief Complaint:   OBESITY Andrea Jones is here to discuss her progress with her obesity treatment plan along with follow-up of her obesity related diagnoses.   Today's visit was #: 9 Starting weight: 183 lbs Starting date: 06/09/2020 Today's weight: 169 lbs Today's date: 11/11/2020 Weight change since last visit: 4 lbs Total lbs lost to date: 14 lbs Body mass index is 30.91 kg/m.  Total weight loss percentage to date: -7.65%  Interim History:  Andrea Jones was more meticulous in getting in her proteins and hitting her calorie goal.  Feeling in control and much better about herself.  She is drinking about 70 ounces of water per day.  Plan:  Gave Protein Equivalents sheet.  Current Meal Plan: keeping a food journal and adhering to recommended goals of 1500-1600 calories and 120 grams of protein for 95% of the time.  Current Exercise Plan: Walking for 30 minutes 7 times per week.  Assessment/Plan:   Medications Discontinued During This Encounter  Medication Reason   Vitamin D, Ergocalciferol, (DRISDOL) 1.25 MG (50000 UNIT) CAPS capsule Reorder   Meds ordered this encounter  Medications   Vitamin D, Ergocalciferol, (DRISDOL) 1.25 MG (50000 UNIT) CAPS capsule    Sig: Take 1 capsule (50,000 Units total) by mouth every 7 (seven) days.    Dispense:  4 capsule    Refill:  0   1. Essential hypertension Not at goal. Medications: Norvasc 10 mg daily.  Asymptomatic, without concerns.  Plan:  At goal. Avoid buying foods that are: processed, frozen, or prepackaged to avoid excess salt. We will watch for signs of hypotension as she continues lifestyle modifications. We will continue to monitor closely alongside her PCP and/or Specialist.  Regular follow up with PCP and specialists was also encouraged.   BP Readings from Last 3 Encounters:  11/11/20 122/75  10/19/20 119/75  10/02/20 137/83   Lab Results  Component Value Date   CREATININE 0.94 06/09/2020   2. Vitamin D deficiency Not at  goal. Current vitamin D is 23.3, tested on 06/09/2020. Optimal goal > 50 ng/dL.  She is taking vitamin D 50,000 IU weekly.  Plan: Continue to take prescription Vitamin D @50 ,000 IU every week as prescribed.  Follow-up for routine testing of Vitamin D, at least 2-3 times per year to avoid over-replacement.  - Refill Vitamin D, Ergocalciferol, (DRISDOL) 1.25 MG (50000 UNIT) CAPS capsule; Take 1 capsule (50,000 Units total) by mouth every 7 (seven) days.  Dispense: 4 capsule; Refill: 0  3. At risk for dehydration Andrea Jones is at higher than average risk of dehydration.  Andrea Jones was given more than 11 minutes of proper hydration counseling today.  We discussed the signs and symptoms of dehydration, some of which may include muscle cramping, constipation or even orthostatic symptoms.  Counseling on the prevention of dehydration was also provided today.  Andrea Jones is at risk for dehydration due to weight loss, lifestyle and behavorial habits and possibly due to taking certain medication(s).  She was encouraged to adequately hydrate and monitor fluid status to avoid dehydration as well as weight loss plateaus.  Unless pre-existing renal or cardiopulmonary conditions exist, in which patient was told to limit their fluid intake, I recommended roughly one half of their weight in pounds to be the approximate ounces of non-caloric, non-caffeinated beverages they should drink per day; including more if they are engaging in exercise.  4. Obesity, current BMI 31.1  Course: Andrea Jones is currently in the action stage of change. As such, her goal  is to continue with weight loss efforts.   Nutrition goals: She has agreed to keeping a food journal and adhering to recommended goals of 1500-1600 calories and 120 grams of protein.   Exercise goals:  As is.  Behavioral modification strategies: meal planning and cooking strategies and planning for success.  Andrea Jones has agreed to follow-up with our clinic in 3 weeks. She was informed  of the importance of frequent follow-up visits to maximize her success with intensive lifestyle modifications for her multiple health conditions.   Objective:   Blood pressure 122/75, pulse 66, temperature 97.6 F (36.4 C), height 5\' 2"  (1.575 m), weight 169 lb (76.7 kg), SpO2 96 %. Body mass index is 30.91 kg/m.  General: Cooperative, alert, well developed, in no acute distress. HEENT: Conjunctivae and lids unremarkable. Cardiovascular: Regular rhythm.  Lungs: Normal work of breathing. Neurologic: No focal deficits.   Lab Results  Component Value Date   CREATININE 0.94 06/09/2020   BUN 12 06/09/2020   NA 140 06/09/2020   K 4.0 06/09/2020   CL 102 06/09/2020   CO2 23 06/09/2020   Lab Results  Component Value Date   ALT 47 (H) 06/09/2020   AST 30 06/09/2020   ALKPHOS 60 06/09/2020   BILITOT 0.5 06/09/2020   Lab Results  Component Value Date   HGBA1C 5.5 06/09/2020   HGBA1C 5.5 02/25/2019   Lab Results  Component Value Date   INSULIN 21.1 06/09/2020   Lab Results  Component Value Date   TSH 1.610 06/09/2020   Lab Results  Component Value Date   CHOL 278 (H) 06/09/2020   HDL 40 06/09/2020   LDLCALC 198 (H) 06/09/2020   TRIG 205 (H) 06/09/2020   CHOLHDL 7.0 (H) 06/09/2020   Lab Results  Component Value Date   WBC 6.7 06/09/2020   HGB 14.8 06/09/2020   HCT 42.7 06/09/2020   MCV 90 06/09/2020   PLT 225 06/09/2020   Attestation Statements:   Reviewed by clinician on day of visit: allergies, medications, problem list, medical history, surgical history, family history, social history, and previous encounter notes.  I, Water quality scientist, CMA, am acting as Location manager for [employer], DO.  I have reviewed the above documentation for accuracy and completeness, and I agree with the above. Marjory Sneddon, D.O.  The [place] was signed into law in 2016 which includes the topic of electronic health records.  This provides immediate access to  information in MyChart.  This includes consultation notes, operative notes, office notes, lab results and pathology reports.  If you have any questions about what you read please let us know at your next visit so we can discuss your concerns and take corrective action if need be.  We are right here with you.

## 2020-12-02 ENCOUNTER — Encounter (INDEPENDENT_AMBULATORY_CARE_PROVIDER_SITE_OTHER): Payer: Self-pay | Admitting: Family Medicine

## 2020-12-02 ENCOUNTER — Other Ambulatory Visit: Payer: Self-pay

## 2020-12-02 ENCOUNTER — Ambulatory Visit (INDEPENDENT_AMBULATORY_CARE_PROVIDER_SITE_OTHER): Payer: 59 | Admitting: Family Medicine

## 2020-12-02 VITALS — BP 117/78 | HR 68 | Temp 97.8°F | Ht 62.0 in | Wt 170.0 lb

## 2020-12-02 DIAGNOSIS — E669 Obesity, unspecified: Secondary | ICD-10-CM

## 2020-12-02 DIAGNOSIS — Z9189 Other specified personal risk factors, not elsewhere classified: Secondary | ICD-10-CM

## 2020-12-02 DIAGNOSIS — E559 Vitamin D deficiency, unspecified: Secondary | ICD-10-CM | POA: Diagnosis not present

## 2020-12-02 DIAGNOSIS — Z6832 Body mass index (BMI) 32.0-32.9, adult: Secondary | ICD-10-CM

## 2020-12-02 DIAGNOSIS — E7849 Other hyperlipidemia: Secondary | ICD-10-CM

## 2020-12-02 DIAGNOSIS — I10 Essential (primary) hypertension: Secondary | ICD-10-CM

## 2020-12-02 DIAGNOSIS — E8881 Metabolic syndrome: Secondary | ICD-10-CM | POA: Diagnosis not present

## 2020-12-02 DIAGNOSIS — K760 Fatty (change of) liver, not elsewhere classified: Secondary | ICD-10-CM

## 2020-12-02 MED ORDER — VITAMIN D (ERGOCALCIFEROL) 1.25 MG (50000 UNIT) PO CAPS: 50000.0000 [IU] | ORAL_CAPSULE | ORAL | 0 refills | Status: DC

## 2020-12-14 NOTE — Progress Notes (Signed)
Chief Complaint:   OBESITY Andrea Jones is here to discuss her progress with her obesity treatment plan along with follow-up of her obesity related diagnoses.   Today's visit was #: 10 Starting weight: 183 lbs Starting date: 06/09/2020 Today's weight: 170 lbs Today's date: 12/02/2020 Weight change since last visit: +1 lbs Total lbs lost to date: 13 lbs Body mass index is 31.09 kg/m.  Total weight loss percentage to date: -7.10%  Interim History:  Andrea Jones went to her grandson's birthday party and had cake.  She has gained 1 pound of water.  She likes journaling and is hitting her goals 90% of the time.  Drinking 60 ounces of water per day.  Plan:  Bring in log of date, total calories, and total protein per day.  Current Meal Plan: keeping a food journal and adhering to recommended goals of 1500-1600 calories and 120 grams of protein for 90% of the time.   Current Exercise Plan: Walking for 30 minutes 5 times per week.  Assessment/Plan:   Orders Placed This Encounter  Procedures   Insulin, random   Lipid Panel With LDL/HDL Ratio   VITAMIN D 25 Hydroxy (Vit-D Deficiency, Fractures)   Hepatic function panel   Medications Discontinued During This Encounter  Medication Reason   Vitamin D, Ergocalciferol, (DRISDOL) 1.25 MG (50000 UNIT) CAPS capsule Reorder   Meds ordered this encounter  Medications   Vitamin D, Ergocalciferol, (DRISDOL) 1.25 MG (50000 UNIT) CAPS capsule    Sig: Take 1 capsule (50,000 Units total) by mouth every 7 (seven) days.    Dispense:  4 capsule    Refill:  0    Ov for RF   1. Other hyperlipidemia Course: Not at goal. Lipid-lowering medications: Omega-3.   Plan: Dietary changes: Increase soluble fiber, decrease simple carbohydrates, decrease saturated fat. Exercise changes: Moderate to vigorous-intensity aerobic activity 150 minutes per week or as tolerated. We will continue to monitor along with PCP/specialists as it pertains to her weight loss journey.   Check lipid panel at next office visit.  Lab Results  Component Value Date   CHOL 278 (H) 06/09/2020   HDL 40 06/09/2020   LDLCALC 198 (H) 06/09/2020   TRIG 205 (H) 06/09/2020   CHOLHDL 7.0 (H) 06/09/2020   Lab Results  Component Value Date   ALT 47 (H) 06/09/2020   AST 30 06/09/2020   ALKPHOS 60 06/09/2020   BILITOT 0.5 06/09/2020   The 10-year ASCVD risk score [name], et al., 2013) is: 5.5%   Values used to calculate the score:     Age: 60 years     Sex: Female     Is Non-Hispanic African American: No     Diabetic: No     Tobacco smoker: No     Systolic Blood Pressure: 60 mmHg     Is BP treated: Yes     HDL Cholesterol: 40 mg/dL     Total Cholesterol: 278 mg/dL  - Lipid Panel With LDL/HDL Ratio  2. NAFLD (nonalcoholic fatty liver disease) NAFLD is an umbrella term that encompasses a disease spectrum that includes steatosis (fat) without inflammation, steatohepatitis (NASH; fat + inflammation in a characteristic pattern), and cirrhosis. Bland steatosis is felt to be a benign condition, with extremely low to no risk of progression to cirrhosis, whereas NASH can progress to cirrhosis. The mainstay of treatment of NAFLD includes lifestyle modification to achieve weight loss, at least 7% of current body weight. Low carbohydrate diets can be beneficial  in improving NAFLD liver histology. Additionally, exercise, even the absence of weight loss can have beneficial effects on the patient's metabolic profile and liver health.  Repeat hepatic function panel at next office visit.  - Hepatic function panel  3. Insulin resistance Not at goal. Goal is HgbA1c < 5.7, fasting insulin closer to 5.  Medication: None.    Plan:  She will continue to focus on protein-rich, low simple carbohydrate foods. We reviewed the importance of hydration, regular exercise for stress reduction, and restorative sleep.  Will check insulin level at next office visit.  Lab Results  Component Value Date    HGBA1C 5.5 06/09/2020   Lab Results  Component Value Date   INSULIN 21.1 06/09/2020   - Insulin, random  4. Vitamin D deficiency Not at goal.  She is taking vitamin D 50,000 IU weekly.  Plan: Continue to take prescription Vitamin D @50 ,000 IU every week as prescribed.  Follow-up for routine testing of Vitamin D, at least 2-3 times per year to avoid over-replacement.  Will check vitamin D level at next office visit.  Lab Results  Component Value Date   VD25OH 23.3 (L) 06/09/2020   - Refill Vitamin D, Ergocalciferol, (DRISDOL) 1.25 MG (50000 UNIT) CAPS capsule; Take 1 capsule (50,000 Units total) by mouth every 7 (seven) days.  Dispense: 4 capsule; Refill: 0 - VITAMIN D 25 Hydroxy (Vit-D Deficiency, Fractures)  5. Essential hypertension At goal. Medications: Norvasc 10 mg daily.   Plan: Avoid buying foods that are: processed, frozen, or prepackaged to avoid excess salt. We will watch for signs of hypotension as she continues lifestyle modifications. We will continue to monitor closely alongside her PCP and/or Specialist.  Regular follow up with PCP and specialists was also encouraged.   BP Readings from Last 3 Encounters:  12/02/20 117/78  11/11/20 122/75  10/19/20 119/75   Lab Results  Component Value Date   CREATININE 0.94 06/09/2020   6. At risk for dehydration Andrea Jones is at higher than average risk of dehydration.  Andrea Jones was given more than 9 minutes of proper hydration counseling today.  We discussed the signs and symptoms of dehydration, some of which may include muscle cramping, constipation or even orthostatic symptoms.  Counseling on the prevention of dehydration was also provided today.  Andrea Jones is at risk for dehydration due to weight loss, lifestyle and behavorial habits and possibly due to taking certain medication(s).  She was encouraged to adequately hydrate and monitor fluid status to avoid dehydration as well as weight loss plateaus.  Unless pre-existing renal or  cardiopulmonary conditions exist, in which patient was told to limit their fluid intake, I recommended roughly one half of their weight in pounds to be the approximate ounces of non-caloric, non-caffeinated beverages they should drink per day; including more if they are engaging in exercise.  7. Class 1 obesity with serious comorbidity and body mass index (BMI) of 32.0 to 32.9 in adult, unspecified obesity type  Course: Andrea Jones is currently in the action stage of change. As such, her goal is to continue with weight loss efforts.   Nutrition goals: She has agreed to keeping a food journal and adhering to recommended goals of 1500-1600 calories and 120 grams of protein.   Exercise goals:  Add strength training for 20-30 minutes 2 days per week to existing regimen.  Behavioral modification strategies: keeping a strict food journal.  Andrea Jones has agreed to follow-up with our clinic in 3 weeks, fasting. She was informed of the importance  of frequent follow-up visits to maximize her success with intensive lifestyle modifications for her multiple health conditions.   Objective:   Blood pressure 117/78, pulse 68, temperature 97.8 F (36.6 C), height 5\' 2"  (1.575 m), weight 170 lb (77.1 kg), SpO2 97 %. Body mass index is 31.09 kg/m.  General: Cooperative, alert, well developed, in no acute distress. HEENT: Conjunctivae and lids unremarkable. Cardiovascular: Regular rhythm.  Lungs: Normal work of breathing. Neurologic: No focal deficits.   Lab Results  Component Value Date   CREATININE 0.94 06/09/2020   BUN 12 06/09/2020   NA 140 06/09/2020   K 4.0 06/09/2020   CL 102 06/09/2020   CO2 23 06/09/2020   Lab Results  Component Value Date   ALT 47 (H) 06/09/2020   AST 30 06/09/2020   ALKPHOS 60 06/09/2020   BILITOT 0.5 06/09/2020   Lab Results  Component Value Date   HGBA1C 5.5 06/09/2020   HGBA1C 5.5 02/25/2019   Lab Results  Component Value Date   INSULIN 21.1 06/09/2020   Lab  Results  Component Value Date   TSH 1.610 06/09/2020   Lab Results  Component Value Date   CHOL 278 (H) 06/09/2020   HDL 40 06/09/2020   LDLCALC 198 (H) 06/09/2020   TRIG 205 (H) 06/09/2020   CHOLHDL 7.0 (H) 06/09/2020   Lab Results  Component Value Date   VD25OH 23.3 (L) 06/09/2020   Lab Results  Component Value Date   WBC 6.7 06/09/2020   HGB 14.8 06/09/2020   HCT 42.7 06/09/2020   MCV 90 06/09/2020   PLT 225 06/09/2020   Attestation Statements:   Reviewed by clinician on day of visit: allergies, medications, problem list, medical history, surgical history, family history, social history, and previous encounter notes.  I, Water quality scientist, CMA, am acting as Location manager for [employer], DO.  I have reviewed the above documentation for accuracy and completeness, and I agree with the above. Marjory Sneddon, D.O.  The [place] was signed into law in 2016 which includes the topic of electronic health records.  This provides immediate access to information in MyChart.  This includes consultation notes, operative notes, office notes, lab results and pathology reports.  If you have any questions about what you read please let us know at your next visit so we can discuss your concerns and take corrective action if need be.  We are right here with you.

## 2020-12-19 LAB — HEPATIC FUNCTION PANEL
ALT: 26 IU/L (ref 0–32)
AST: 18 IU/L (ref 0–40)
Albumin: 4.5 g/dL (ref 3.8–4.9)
Alkaline Phosphatase: 55 IU/L (ref 44–121)
Bilirubin Total: 0.5 mg/dL (ref 0.0–1.2)
Bilirubin, Direct: 0.13 mg/dL (ref 0.00–0.40)
Total Protein: 7.2 g/dL (ref 6.0–8.5)

## 2020-12-19 LAB — INSULIN, RANDOM: INSULIN: 50.6 u[IU]/mL — ABNORMAL HIGH (ref 2.6–24.9)

## 2020-12-19 LAB — LIPID PANEL WITH LDL/HDL RATIO
Cholesterol, Total: 270 mg/dL — ABNORMAL HIGH (ref 100–199)
HDL: 37 mg/dL — ABNORMAL LOW (ref >39)
LDL Chol Calc (NIH): 196 mg/dL — ABNORMAL HIGH (ref 0–99)
LDL/HDL Ratio: 5.3 ratio — ABNORMAL HIGH (ref 0.0–3.2)
Triglycerides: 194 mg/dL — ABNORMAL HIGH (ref 0–149)
VLDL Cholesterol Cal: 37 mg/dL (ref 5–40)

## 2020-12-19 LAB — VITAMIN D 25 HYDROXY (VIT D DEFICIENCY, FRACTURES): Vit D, 25-Hydroxy: 53.2 ng/mL (ref 30.0–100.0)

## 2020-12-23 ENCOUNTER — Encounter (INDEPENDENT_AMBULATORY_CARE_PROVIDER_SITE_OTHER): Payer: Self-pay | Admitting: Family Medicine

## 2020-12-23 ENCOUNTER — Other Ambulatory Visit: Payer: Self-pay

## 2020-12-23 ENCOUNTER — Ambulatory Visit (INDEPENDENT_AMBULATORY_CARE_PROVIDER_SITE_OTHER): Payer: 59 | Admitting: Family Medicine

## 2020-12-23 VITALS — BP 127/76 | HR 67 | Temp 98.0°F | Ht 62.0 in | Wt 169.0 lb

## 2020-12-23 DIAGNOSIS — K760 Fatty (change of) liver, not elsewhere classified: Secondary | ICD-10-CM

## 2020-12-23 DIAGNOSIS — E669 Obesity, unspecified: Secondary | ICD-10-CM

## 2020-12-23 DIAGNOSIS — E559 Vitamin D deficiency, unspecified: Secondary | ICD-10-CM | POA: Diagnosis not present

## 2020-12-23 DIAGNOSIS — E7849 Other hyperlipidemia: Secondary | ICD-10-CM

## 2020-12-23 DIAGNOSIS — E8881 Metabolic syndrome: Secondary | ICD-10-CM | POA: Diagnosis not present

## 2020-12-23 DIAGNOSIS — Z9189 Other specified personal risk factors, not elsewhere classified: Secondary | ICD-10-CM | POA: Diagnosis not present

## 2020-12-23 DIAGNOSIS — Z6833 Body mass index (BMI) 33.0-33.9, adult: Secondary | ICD-10-CM

## 2020-12-23 MED ORDER — ROSUVASTATIN CALCIUM 10 MG PO TABS: 10.0000 mg | ORAL_TABLET | ORAL | 0 refills | Status: DC

## 2020-12-23 MED ORDER — VITAMIN D (ERGOCALCIFEROL) 1.25 MG (50000 UNIT) PO CAPS: 50000.0000 [IU] | ORAL_CAPSULE | ORAL | 0 refills | Status: DC

## 2020-12-23 NOTE — Patient Instructions (Signed)
The 10-year ASCVD risk score Mikey Bussing DC Brooke Bonito., et al., 2013) is: 6.7%   Values used to calculate the score:     Age: 60 years     Sex: Female     Is Non-Hispanic African American: No     Diabetic: No     Tobacco smoker: No     Systolic Blood Pressure: 289 mmHg     Is BP treated: Yes     HDL Cholesterol: 37 mg/dL     Total Cholesterol: 270 mg/dL

## 2020-12-31 NOTE — Progress Notes (Signed)
Chief Complaint:   OBESITY Andrea Jones is here to discuss her progress with her obesity treatment plan along with follow-up of her obesity related diagnoses.   Today's visit was #: 11 Starting weight: 183 lbs Starting date: 06/09/2020 Today's weight: 169 lbs Today's date: 12/23/2020 Weight change since last visit: 1 lbs Total lbs lost to date: 14 lbs Body mass index is 30.91 kg/m.  Total weight loss percentage to date: -7.65%  Interim History:  Frannie went on vacation for 1 week in [place].  She did some off plan eating, she says.  Plan:  Bring in log of calories and proteins eating each day to next office visit.  Current Meal Plan: keeping a food journal and adhering to recommended goals of 1500-1600 calories and 120 grams of protein for 60% of the time.  Current Exercise Plan: Walking, light weights for 30, 10 minutes 5, 2 times per week.  Assessment/Plan:   Medications Discontinued During This Encounter  Medication Reason   Vitamin D, Ergocalciferol, (DRISDOL) 1.25 MG (50000 UNIT) CAPS capsule Reorder   Meds ordered this encounter  Medications   Vitamin D, Ergocalciferol, (DRISDOL) 1.25 MG (50000 UNIT) CAPS capsule    Sig: Take 1 capsule (50,000 Units total) by mouth every 7 (seven) days.    Dispense:  4 capsule    Refill:  0    Ov for RF   rosuvastatin (CRESTOR) 10 MG tablet    Sig: Take 1 tablet (10 mg total) by mouth as directed. Take 1/2 tablet nightly    Dispense:  30 tablet    Refill:  0    1. Insulin resistance Not at goal. Goal is HgbA1c < 5.7, fasting insulin closer to 5.  Medication: None.    Plan:  Worsening.  Discussed labs with patient today.  Patient was fasting.  She will get an A1c in 1 month.  She will continue to focus on protein-rich, low simple carbohydrate foods. We reviewed the importance of hydration, regular exercise for stress reduction, and restorative sleep.   Lab Results  Component Value Date   HGBA1C 5.5 06/09/2020   Lab Results   Component Value Date   INSULIN 50.6 (H) 12/18/2020   INSULIN 21.1 06/09/2020   2. NAFLD (nonalcoholic fatty liver disease) No ETOH.  Plan:  Discussed labs with patient today.  Improved and labs within normal limits.  Continue prudent nutritional plan and weight loss.  NAFLD is an umbrella term that encompasses a disease spectrum that includes steatosis (fat) without inflammation, steatohepatitis (NASH; fat + inflammation in a characteristic pattern), and cirrhosis. Bland steatosis is felt to be a benign condition, with extremely low to no risk of progression to cirrhosis, whereas NASH can progress to cirrhosis. The mainstay of treatment of NAFLD includes lifestyle modification to achieve weight loss, at least 7% of current body weight. Low carbohydrate diets can be beneficial in improving NAFLD liver histology. Additionally, exercise, even the absence of weight loss can have beneficial effects on the patient's metabolic profile and liver health.   3. Other hyperlipidemia Course: Not at goal. Lipid-lowering medications: Crestor 10 mg daily.  No improvement for the last 6 months.  She declined medications then, but realizes that due to increased risk of CV events, she will need them now.  Plan:  Slightly worse.  Discussed labs with patient today.  Start Crestor 10 mg, but take 1/2 tablet nightly.  Recheck ALT and FLP in 6-8 weeks.  Dietary changes: Increase soluble fiber, decrease simple  carbohydrates, decrease saturated fat. Exercise changes: Moderate to vigorous-intensity aerobic activity 150 minutes per week or as tolerated. We will continue to monitor along with PCP/specialists as it pertains to her weight loss journey.  Lab Results  Component Value Date   CHOL 270 (H) 12/18/2020   HDL 37 (L) 12/18/2020   LDLCALC 196 (H) 12/18/2020   TRIG 194 (H) 12/18/2020   CHOLHDL 7.0 (H) 06/09/2020   Lab Results  Component Value Date   ALT 26 12/18/2020   AST 18 12/18/2020   ALKPHOS 55 12/18/2020    BILITOT 0.5 12/18/2020   The 10-year ASCVD risk score [name], et al., 2013) is: 6.8%   Values used to calculate the score:     Age: 60 years     Sex: Female     Is Non-Hispanic African American: No     Diabetic: No     Tobacco smoker: No     Systolic Blood Pressure: 0000000 mmHg     Is BP treated: Yes     HDL Cholesterol: 37 mg/dL     Total Cholesterol: 270 mg/dL  - Start rosuvastatin (CRESTOR) 10 MG tablet; Take 1 tablet (10 mg total) by mouth as directed. Take 1/2 tablet nightly  Dispense: 30 tablet; Refill: 0  4. Vitamin D deficiency At goal.  She is taking vitamin D 50,000 IU weekly.  Feeling much better on the vitmain D.  More energy.  Feels like being more active.  Plan:  Discussed labs with patient today.  Continue supplement.  Continue to take prescription Vitamin D '@50'$ ,000 IU every week as prescribed.  Follow-up for routine testing of Vitamin D, at least 2-3 times per year to avoid over-replacement.  Lab Results  Component Value Date   VD25OH 53.2 12/18/2020   VD25OH 23.3 (L) 06/09/2020   - Refill Vitamin D, Ergocalciferol, (DRISDOL) 1.25 MG (50000 UNIT) CAPS capsule; Take 1 capsule (50,000 Units total) by mouth every 7 (seven) days.  Dispense: 4 capsule; Refill: 0  5. At risk for diabetes mellitus - Katerra was given diabetes prevention education and counseling today of more than 10 minutes.  - Counseled patient on pathophysiology of disease and meaning/ implication of lab results.  - Reviewed how certain foods can either stimulate or inhibit insulin release, and subsequently affect hunger pathways  - Importance of following a healthy meal plan with limiting amounts of simple carbohydrates discussed with patient - Effects of regular aerobic exercise on blood sugar regulation reviewed and encouraged an eventual goal of 30 min 5d/week or more as a minimum.  - Briefly discussed treatment options, which always include dietary and lifestyle modification as first line.   -  Handouts provided at patient's desire and/or told to go online to the American Diabetes Association website for further information.  6. Obesity with current BMI of 30.9  Course: Keirah is currently in the action stage of change. As such, her goal is to continue with weight loss efforts.   Nutrition goals: She has agreed to keeping a food journal and adhering to recommended goals of 1500-1600 calories and 120 grams of protein.   Exercise goals: For substantial health benefits, adults should do at least 150 minutes (2 hours and 30 minutes) a week of moderate-intensity, or 75 minutes (1 hour and 15 minutes) a week of vigorous-intensity aerobic physical activity, or an equivalent combination of moderate- and vigorous-intensity aerobic activity. Aerobic activity should be performed in episodes of at least 10 minutes, and preferably, it should be  spread throughout the week.  Behavioral modification strategies: increasing lean protein intake, decreasing simple carbohydrates, and keeping a strict food journal.  Jazlyne has agreed to follow-up with our clinic in 3 weeks. She was informed of the importance of frequent follow-up visits to maximize her success with intensive lifestyle modifications for her multiple health conditions.   Objective:   Blood pressure 127/76, pulse 67, temperature 98 F (36.7 C), height '5\' 2"'$  (1.575 m), weight 169 lb (76.7 kg), SpO2 97 %. Body mass index is 30.91 kg/m.  General: Cooperative, alert, well developed, in no acute distress. HEENT: Conjunctivae and lids unremarkable. Cardiovascular: Regular rhythm.  Lungs: Normal work of breathing. Neurologic: No focal deficits.   Lab Results  Component Value Date   CREATININE 0.94 06/09/2020   BUN 12 06/09/2020   NA 140 06/09/2020   K 4.0 06/09/2020   CL 102 06/09/2020   CO2 23 06/09/2020   Lab Results  Component Value Date   ALT 26 12/18/2020   AST 18 12/18/2020   ALKPHOS 55 12/18/2020   BILITOT 0.5 12/18/2020    Lab Results  Component Value Date   HGBA1C 5.5 06/09/2020   HGBA1C 5.5 02/25/2019   Lab Results  Component Value Date   INSULIN 50.6 (H) 12/18/2020   INSULIN 21.1 06/09/2020   Lab Results  Component Value Date   TSH 1.610 06/09/2020   Lab Results  Component Value Date   CHOL 270 (H) 12/18/2020   HDL 37 (L) 12/18/2020   LDLCALC 196 (H) 12/18/2020   TRIG 194 (H) 12/18/2020   CHOLHDL 7.0 (H) 06/09/2020   Lab Results  Component Value Date   VD25OH 53.2 12/18/2020   VD25OH 23.3 (L) 06/09/2020   Lab Results  Component Value Date   WBC 6.7 06/09/2020   HGB 14.8 06/09/2020   HCT 42.7 06/09/2020   MCV 90 06/09/2020   PLT 225 06/09/2020   Attestation Statements:   Reviewed by clinician on day of visit: allergies, medications, problem list, medical history, surgical history, family history, social history, and previous encounter notes.  I, Water quality scientist, CMA, am acting as Location manager for [employer], DO.  I have reviewed the above documentation for accuracy and completeness, and I agree with the above. Marjory Sneddon, D.O.  The [place] was signed into law in 2016 which includes the topic of electronic health records.  This provides immediate access to information in MyChart.  This includes consultation notes, operative notes, office notes, lab results and pathology reports.  If you have any questions about what you read please let us know at your next visit so we can discuss your concerns and take corrective action if need be.  We are right here with you.

## 2021-01-13 ENCOUNTER — Ambulatory Visit (INDEPENDENT_AMBULATORY_CARE_PROVIDER_SITE_OTHER): Payer: 59 | Admitting: Family Medicine

## 2021-01-13 DIAGNOSIS — E669 Obesity, unspecified: Secondary | ICD-10-CM

## 2021-01-13 DIAGNOSIS — Z5329 Procedure and treatment not carried out because of patient's decision for other reasons: Secondary | ICD-10-CM

## 2021-01-26 ENCOUNTER — Other Ambulatory Visit: Payer: Self-pay

## 2021-01-26 ENCOUNTER — Encounter (INDEPENDENT_AMBULATORY_CARE_PROVIDER_SITE_OTHER): Payer: Self-pay | Admitting: Family Medicine

## 2021-01-26 ENCOUNTER — Ambulatory Visit (INDEPENDENT_AMBULATORY_CARE_PROVIDER_SITE_OTHER): Payer: 59 | Admitting: Family Medicine

## 2021-01-26 VITALS — BP 130/74 | HR 60 | Temp 98.0°F | Ht 62.0 in | Wt 165.0 lb

## 2021-01-26 DIAGNOSIS — E559 Vitamin D deficiency, unspecified: Secondary | ICD-10-CM | POA: Diagnosis not present

## 2021-01-26 DIAGNOSIS — Z6833 Body mass index (BMI) 33.0-33.9, adult: Secondary | ICD-10-CM | POA: Diagnosis not present

## 2021-01-26 DIAGNOSIS — Z9189 Other specified personal risk factors, not elsewhere classified: Secondary | ICD-10-CM

## 2021-01-26 DIAGNOSIS — E669 Obesity, unspecified: Secondary | ICD-10-CM | POA: Diagnosis not present

## 2021-01-26 MED ORDER — VITAMIN D (ERGOCALCIFEROL) 1.25 MG (50000 UNIT) PO CAPS: 50000.0000 [IU] | ORAL_CAPSULE | ORAL | 0 refills | Status: DC

## 2021-01-27 NOTE — Progress Notes (Signed)
Chief Complaint:   OBESITY Andrea Jones is here to discuss her progress with her obesity treatment plan along with follow-up of her obesity related diagnoses. Andrea Jones is on keeping a food journal and adhering to recommended goals of 1500-1600 calories and 120 grams of protein daily and states she is following her eating plan approximately 80% of the time. Andrea Jones states she is walking and occasionally lifting weights and swimming for 30 minutes 5 times per week.  Today's visit was #: 12 Starting weight: 183 lbs Starting date: 06/19/2020 Today's weight: 165 lbs Today's date: 01/26/2021 Total lbs lost to date: 18 Total lbs lost since last in-office visit: 4  Interim History: Andrea Jones is not hitting her protein goals for 2 days after 2-3 weeks and she has been under in calories. She mostly gets busy at work and skips lunch occasionally.  Subjective:   1. Vitamin D deficiency Andrea Jones is currently taking prescription vitamin D 50,000 IU each week. She denies nausea, vomiting or muscle weakness.  2. At risk for malnutrition Andrea Jones is at increased risk for malnutrition due to being under in calories and skipping lunch.  Assessment/Plan:  No orders of the defined types were placed in this encounter.   Medications Discontinued During This Encounter  Medication Reason   Vitamin D, Ergocalciferol, (DRISDOL) 1.25 MG (50000 UNIT) CAPS capsule Reorder     Meds ordered this encounter  Medications   Vitamin D, Ergocalciferol, (DRISDOL) 1.25 MG (50000 UNIT) CAPS capsule    Sig: Take 1 capsule (50,000 Units total) by mouth every 7 (seven) days.    Dispense:  4 capsule    Refill:  0    Ov for RF     1. Vitamin D deficiency Low Vitamin D level contributes to fatigue and are associated with obesity, breast, and colon cancer. We will refill prescription Vitamin D for 1 month. Andrea Jones will follow-up for routine testing of Vitamin D, at least 2-3 times per year to avoid over-replacement.  - Vitamin D,  Ergocalciferol, (DRISDOL) 1.25 MG (50000 UNIT) CAPS capsule; Take 1 capsule (50,000 Units total) by mouth every 7 (seven) days.  Dispense: 4 capsule; Refill: 0  2. At risk for malnutrition Andrea Jones was given extensive malnutrition prevention education and counseling today of more than 9 minutes.  Counseled her that malnutrition refers to inappropriate nutrients or not the right balance of nutrients for optimal health.  Discussed with Andrea Jones that it is absolutely possible to be malnourished but yet obese.  Risk factors, including but not limited to, inappropriate dietary choices, difficulty with obtaining food due to physical or financial limitations, and various physical and mental health conditions were reviewed with Andrea Jones.   3. Obesity with current BMI of 30.3 Andrea Jones is currently in the action stage of change. As such, her goal is to continue with weight loss efforts. She has agreed to keeping a food journal and adhering to recommended goals of 1500-1600 calories and 110+ grams of protein daily.   Exercise goals: As is.  Behavioral modification strategies: increasing lean protein intake, decreasing simple carbohydrates, and no skipping meals.  Andrea Jones has agreed to follow-up with our clinic in 3 weeks. She was informed of the importance of frequent follow-up visits to maximize her success with intensive lifestyle modifications for her multiple health conditions.   Objective:   Blood pressure 130/74, pulse 60, temperature 98 F (36.7 C), height '5\' 2"'$  (1.575 m), weight 165 lb (74.8 kg), SpO2 96 %. Body mass index  is 30.18 kg/m.  General: Cooperative, alert, well developed, in no acute distress. HEENT: Conjunctivae and lids unremarkable. Cardiovascular: Regular rhythm.  Lungs: Normal work of breathing. Neurologic: No focal deficits.   Lab Results  Component Value Date   CREATININE 0.94 06/09/2020   BUN 12 06/09/2020   NA 140 06/09/2020   K 4.0 06/09/2020   CL  102 06/09/2020   CO2 23 06/09/2020   Lab Results  Component Value Date   ALT 26 12/18/2020   AST 18 12/18/2020   ALKPHOS 55 12/18/2020   BILITOT 0.5 12/18/2020   Lab Results  Component Value Date   HGBA1C 5.5 06/09/2020   HGBA1C 5.5 02/25/2019   Lab Results  Component Value Date   INSULIN 50.6 (H) 12/18/2020   INSULIN 21.1 06/09/2020   Lab Results  Component Value Date   TSH 1.610 06/09/2020   Lab Results  Component Value Date   CHOL 270 (H) 12/18/2020   HDL 37 (L) 12/18/2020   LDLCALC 196 (H) 12/18/2020   TRIG 194 (H) 12/18/2020   CHOLHDL 7.0 (H) 06/09/2020   Lab Results  Component Value Date   VD25OH 53.2 12/18/2020   VD25OH 23.3 (L) 06/09/2020   Lab Results  Component Value Date   WBC 6.7 06/09/2020   HGB 14.8 06/09/2020   HCT 42.7 06/09/2020   MCV 90 06/09/2020   PLT 225 06/09/2020   No results found for: IRON, TIBC, FERRITIN  Attestation Statements:   Reviewed by clinician on day of visit: allergies, medications, problem list, medical history, surgical history, family history, social history, and previous encounter notes.   Wilhemena Durie, am acting as transcriptionist for [employer], DO.  I have reviewed the above documentation for accuracy and completeness, and I agree with the above. Andrea Jones, D.O.  The [place] was signed into law in 2016 which includes the topic of electronic health records.  This provides immediate access to information in MyChart.  This includes consultation notes, operative notes, office notes, lab results and pathology reports.  If you have any questions about what you read please let us know at your next visit so we can discuss your concerns and take corrective action if need be.  We are right here with you.

## 2021-02-14 IMAGING — MG MM BREAST SURGICAL SPECIMEN
1 series · 2 of 2 positions shown · non-contrast
Comparison: Previous exam(s).

CLINICAL DATA: Evaluate specimen after excision of a left breast
lesion following radioactive seed localization.

EXAM:
SPECIMEN RADIOGRAPH OF THE LEFT BREAST

[Series 1: L · left · 0.07mm/px · 2 of 2 slices shown]
[im 1/2]
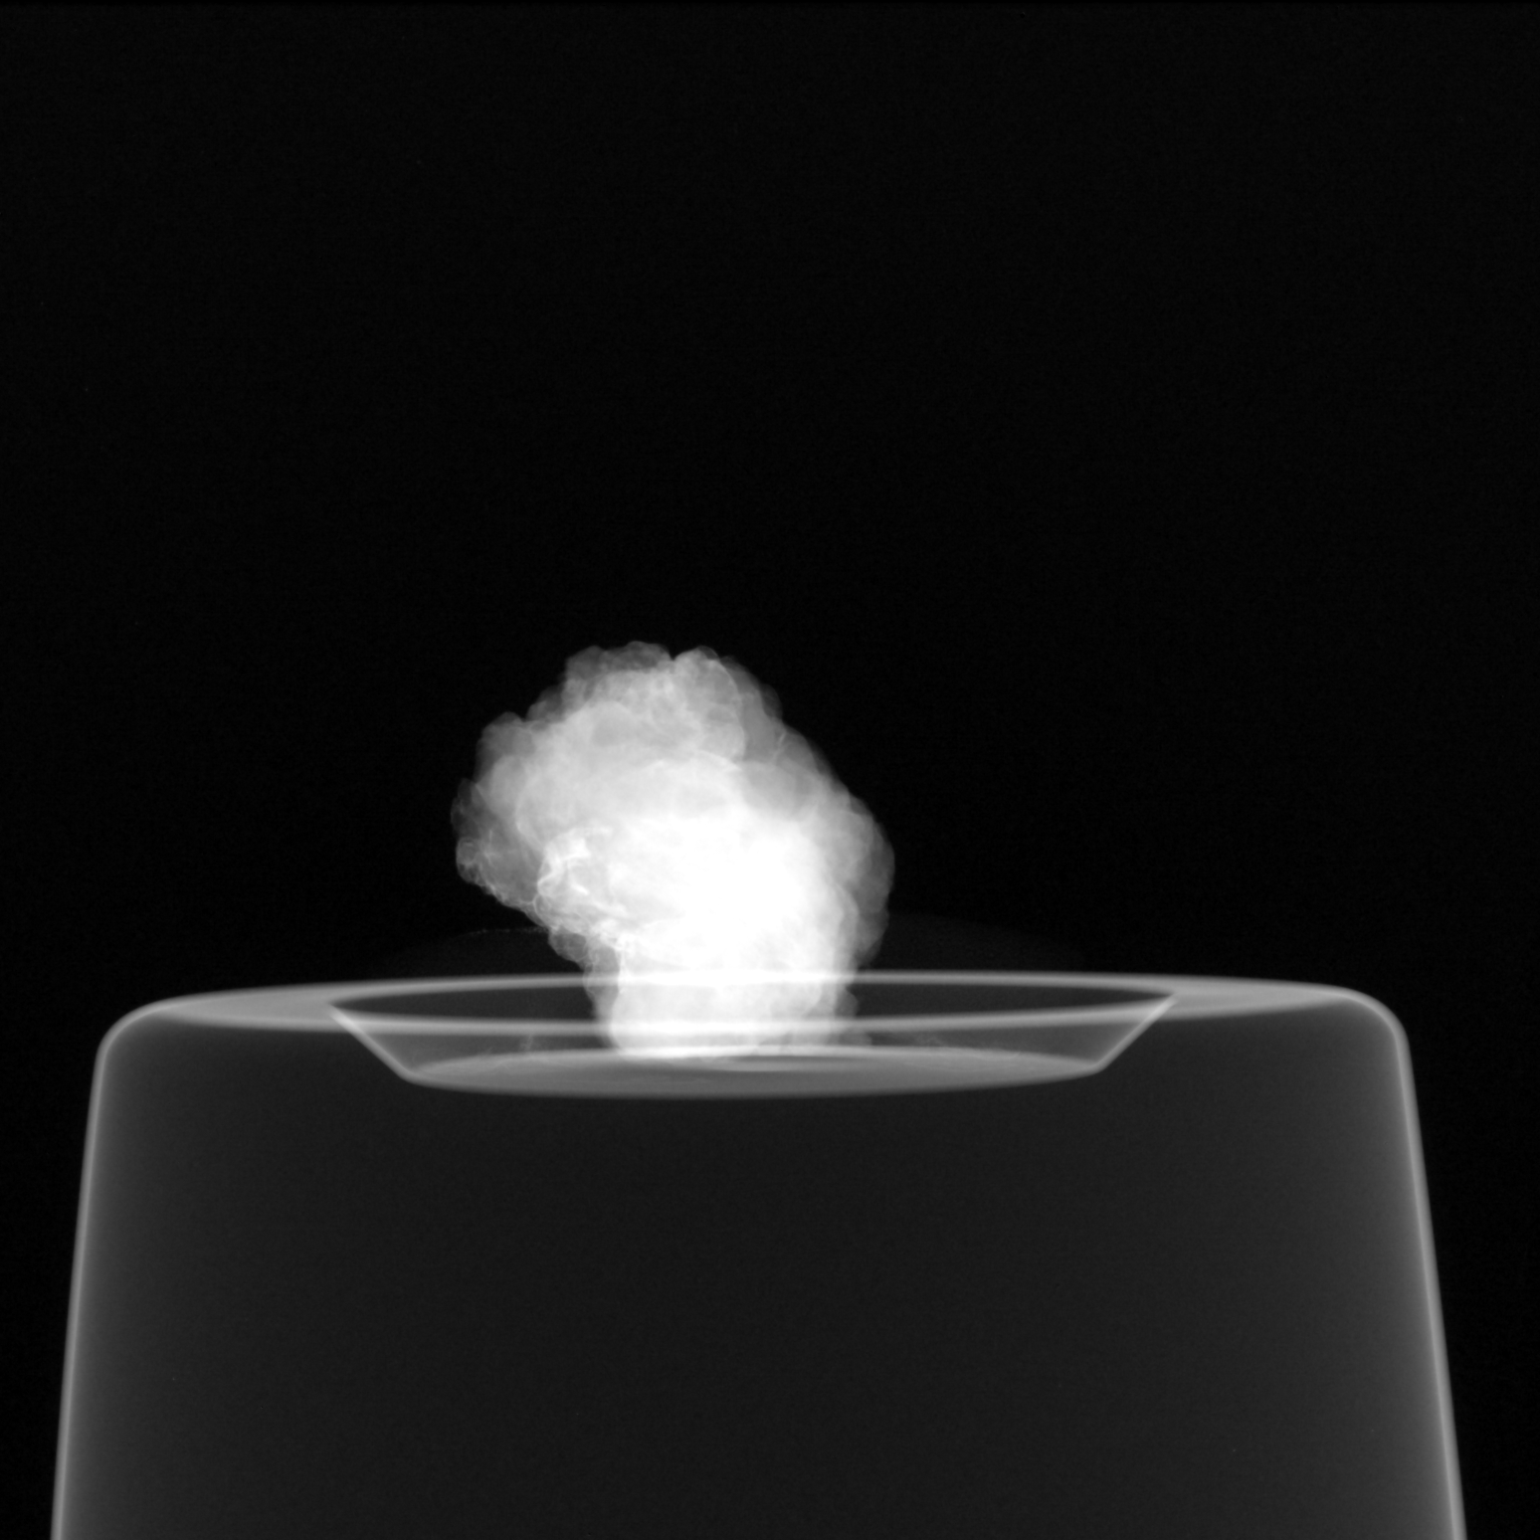
[im 2/2]
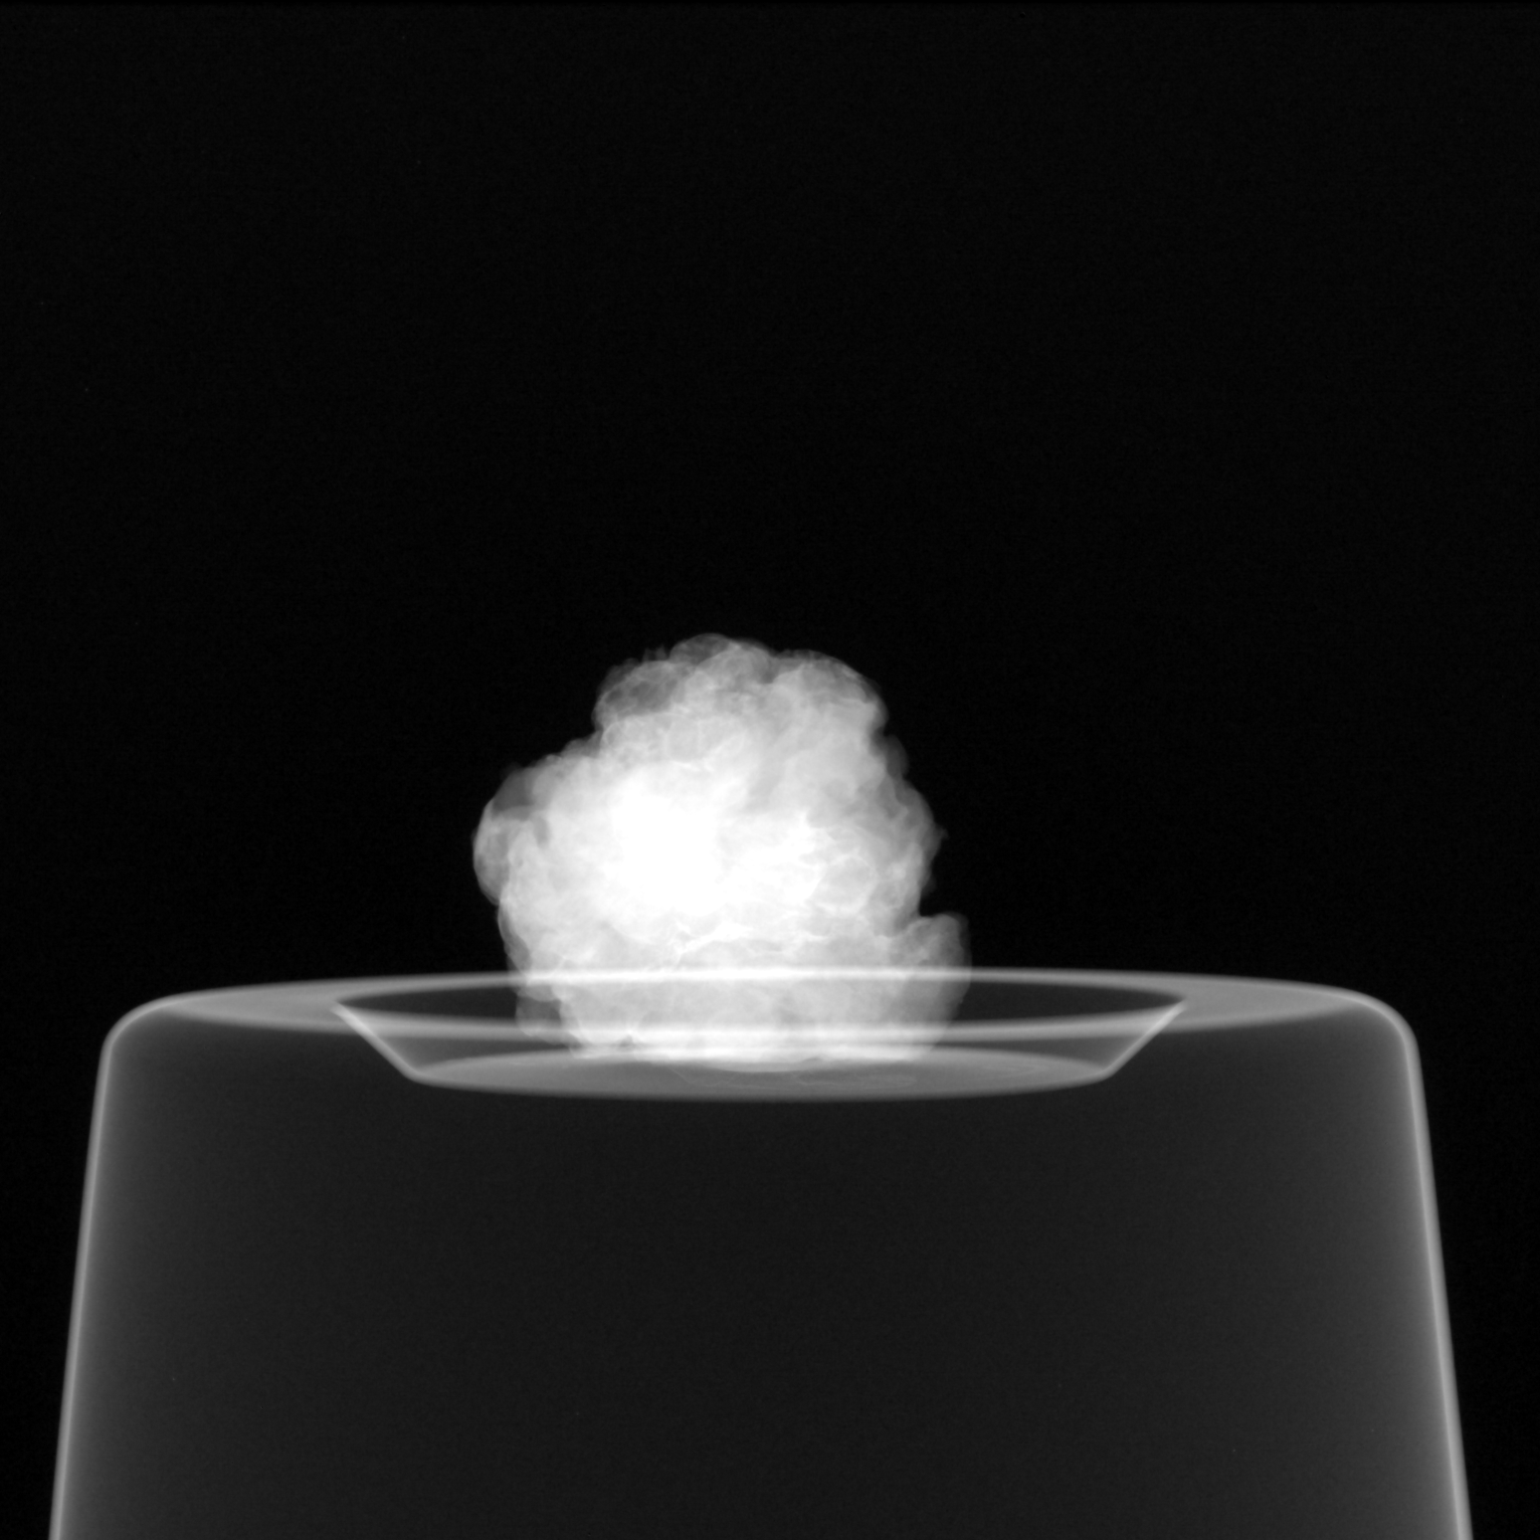

[2 of 2 positions shown; findings below may reference images not displayed]

FINDINGS: Status post excision of the left breast. The radioactive seed and
biopsy marker clip are present, completely intact, and were marked
for pathology.
IMPRESSION: Specimen radiograph of the left breast.

## 2021-02-15 ENCOUNTER — Ambulatory Visit: Payer: 59 | Admitting: Obstetrics & Gynecology

## 2021-02-17 ENCOUNTER — Ambulatory Visit (INDEPENDENT_AMBULATORY_CARE_PROVIDER_SITE_OTHER): Payer: 59 | Admitting: Family Medicine

## 2021-02-17 ENCOUNTER — Encounter (INDEPENDENT_AMBULATORY_CARE_PROVIDER_SITE_OTHER): Payer: Self-pay | Admitting: Family Medicine

## 2021-02-17 ENCOUNTER — Other Ambulatory Visit: Payer: Self-pay

## 2021-02-17 VITALS — BP 125/76 | HR 66 | Temp 97.6°F | Ht 62.0 in | Wt 167.0 lb

## 2021-02-17 DIAGNOSIS — Z6833 Body mass index (BMI) 33.0-33.9, adult: Secondary | ICD-10-CM

## 2021-02-17 DIAGNOSIS — E7849 Other hyperlipidemia: Secondary | ICD-10-CM

## 2021-02-17 DIAGNOSIS — E66811 Obesity, class 1: Secondary | ICD-10-CM

## 2021-02-17 DIAGNOSIS — E8881 Metabolic syndrome: Secondary | ICD-10-CM

## 2021-02-17 DIAGNOSIS — E88819 Insulin resistance, unspecified: Secondary | ICD-10-CM

## 2021-02-17 DIAGNOSIS — E559 Vitamin D deficiency, unspecified: Secondary | ICD-10-CM | POA: Diagnosis not present

## 2021-02-17 DIAGNOSIS — E669 Obesity, unspecified: Secondary | ICD-10-CM

## 2021-02-17 DIAGNOSIS — Z9189 Other specified personal risk factors, not elsewhere classified: Secondary | ICD-10-CM | POA: Diagnosis not present

## 2021-02-17 MED ORDER — ROSUVASTATIN CALCIUM 10 MG PO TABS
ORAL_TABLET | ORAL | 0 refills | Status: DC
Start: 2021-02-17 — End: 2021-04-12

## 2021-02-17 MED ORDER — ROSUVASTATIN CALCIUM 10 MG PO TABS
ORAL_TABLET | ORAL | 0 refills | Status: DC
Start: 2021-02-17 — End: 2021-02-17

## 2021-02-17 MED ORDER — VITAMIN D (ERGOCALCIFEROL) 1.25 MG (50000 UNIT) PO CAPS: 50000.0000 [IU] | ORAL_CAPSULE | ORAL | 0 refills | Status: DC

## 2021-02-18 NOTE — Progress Notes (Signed)
Chief Complaint:   OBESITY Andrea Jones is here to discuss her progress with her obesity treatment plan along with follow-up of her obesity related diagnoses. Andrea Jones is on keeping a food journal and adhering to recommended goals of 1500-1600 calories and 110+ grams of protein daily and states she is following her eating plan approximately 80% of the time. Andrea Jones states she is walking for 30 minutes 4 times per week.  Today's visit was #: 67 Starting weight: 183 lbs Starting date: 06/19/2020 Today's weight: 167 lbs Today's date: 02/17/2021 Total lbs lost to date: 16 Total lbs lost since last in-office visit: 0  Interim History: Andrea Jones notes after 2 weeks she hit her protein goal for 3 days and calorie goals only 1 day. She is eating too little most days.  Subjective:   1. Insulin resistance Andrea Jones has a diagnosis of insulin resistance based on her elevated fasting insulin level >5. She is not on medications and denies carbohydrate cravings. She continues to work on diet and exercise to decrease her risk of diabetes.  2. Other hyperlipidemia Andrea Jones has hyperlipidemia and has been trying to improve her cholesterol levels with intensive lifestyle modification including a low saturated fat diet, exercise and weight loss. She denies any chest pain, claudication or myalgias.  3. Vitamin D deficiency Andrea Jones is currently taking prescription vitamin D 50,000 IU each week. She denies nausea, vomiting or muscle weakness.  4. At risk for impaired metabolic function Andrea Jones is at increased risk for impaired metabolic function due to current nutrition and muscle mass.  Assessment/Plan:   Orders Placed This Encounter  Procedures   Comprehensive metabolic panel   Hemoglobin A1c   Insulin, random   Lipid Panel With LDL/HDL Ratio    Medications Discontinued During This Encounter  Medication Reason   rosuvastatin (CRESTOR) 10 MG tablet Reorder   Vitamin D, Ergocalciferol, (DRISDOL) 1.25 MG  (50000 UNIT) CAPS capsule Reorder   rosuvastatin (CRESTOR) 10 MG tablet      Meds ordered this encounter  Medications   DISCONTD: rosuvastatin (CRESTOR) 10 MG tablet    Sig: Take 1/2 tablet nightly    Dispense:  30 tablet    Refill:  0    30 d supply;  ** OV for RF **   Do not send RF request   Vitamin D, Ergocalciferol, (DRISDOL) 1.25 MG (50000 UNIT) CAPS capsule    Sig: Take 1 capsule (50,000 Units total) by mouth every 7 (seven) days.    Dispense:  4 capsule    Refill:  0    Ov for RF   rosuvastatin (CRESTOR) 10 MG tablet    Sig: Take 1/2 tablet nightly    Dispense:  30 tablet    Refill:  0    30 d supply;  ** OV for RF **   Do not send RF request     1. Insulin resistance Andrea Jones will continue to work on weight loss, exercise, and decreasing simple carbohydrates to help decrease the risk of diabetes. We will recheck labs at her next office visit. Andrea Jones agreed to follow-up with [name] as directed to closely monitor her progress.  - Hemoglobin A1c - Insulin, random  2. Other hyperlipidemia Cardiovascular risk and specific lipid/LDL goals reviewed. We discussed several lifestyle modifications today. We will refill Crestor for 1 month. We will recheck labs at her next office visit. Tyme will continue to work on diet, exercise and weight loss efforts. Orders and follow up as documented in patient record.  Counseling Intensive lifestyle modifications are the first line treatment for this issue. Dietary changes: Increase soluble fiber. Decrease simple carbohydrates. Exercise changes: Moderate to vigorous-intensity aerobic activity 150 minutes per week if tolerated. Lipid-lowering medications: see documented in medical record.  - rosuvastatin (CRESTOR) 10 MG tablet; Take 1/2 tablet nightly  Dispense: 30 tablet; Refill: 0 - Comprehensive metabolic panel - Lipid Panel With LDL/HDL Ratio  3. Vitamin D deficiency Low Vitamin D level contributes to fatigue and are associated with  obesity, breast, and colon cancer. We will refill prescription Vitamin D for 1 month. Marlenie will follow-up for routine testing of Vitamin D, at least 2-3 times per year to avoid over-replacement.  - Vitamin D, Ergocalciferol, (DRISDOL) 1.25 MG (50000 UNIT) CAPS capsule; Take 1 capsule (50,000 Units total) by mouth every 7 (seven) days.  Dispense: 4 capsule; Refill: 0  4. At risk for impaired metabolic function Due to Andrea Jones's current state of health and medical condition(s), she is at a significantly higher risk for impaired metabolic function. At least 9 minutes was spent on counseling Andrea Jones about these concerns today.  This places the patient at a much greater risk to subsequently develop cardio-pulmonary conditions that can negatively affect the patient's quality of life.  I stressed the importance of reversing these risks factors.  The initial goal is to lose at least 5-10% of starting weight to help reduce risk factors.  Counseling:  Intensive lifestyle modifications discussed with Andrea Jones as the most appropriate first line treatment.  she will continue to work on diet, exercise, and weight loss efforts.  We will continue to reassess these conditions on a fairly regular basis in an attempt to decrease the patient's overall morbidity and mortality.  5. Obesity with current BMI of 30.6 Andrea Jones is currently in the action stage of change. As such, her goal is to continue with weight loss efforts. She has agreed to keeping a food journal and adhering to recommended goals of 1500-1600 calories and 110+ grams of protein daily.   Andrea Jones is to bring in her log to her next office visit again. She is to try to increased her calories and protein.  We will recheck fasting labs at her next office visit.  Exercise goals: As is.  Behavioral modification strategies: increasing lean protein intake, decreasing simple carbohydrates, and planning for success.  Andrea Jones has agreed to follow-up with our clinic in 2  weeks. She was informed of the importance of frequent follow-up visits to maximize her success with intensive lifestyle modifications for her multiple health conditions.   Objective:   Blood pressure 125/76, pulse 66, temperature 97.6 F (36.4 C), height '5\' 2"'$  (1.575 m), weight 167 lb (75.8 kg), SpO2 95 %. Body mass index is 30.54 kg/m.  General: Cooperative, alert, well developed, in no acute distress. HEENT: Conjunctivae and lids unremarkable. Cardiovascular: Regular rhythm.  Lungs: Normal work of breathing. Neurologic: No focal deficits.   Lab Results  Component Value Date   CREATININE 0.94 06/09/2020   BUN 12 06/09/2020   NA 140 06/09/2020   K 4.0 06/09/2020   CL 102 06/09/2020   CO2 23 06/09/2020   Lab Results  Component Value Date   ALT 26 12/18/2020   AST 18 12/18/2020   ALKPHOS 55 12/18/2020   BILITOT 0.5 12/18/2020   Lab Results  Component Value Date   HGBA1C 5.5 06/09/2020   HGBA1C 5.5 02/25/2019   Lab Results  Component Value Date   INSULIN 50.6 (H) 12/18/2020   INSULIN 21.1 06/09/2020  Lab Results  Component Value Date   TSH 1.610 06/09/2020   Lab Results  Component Value Date   CHOL 270 (H) 12/18/2020   HDL 37 (L) 12/18/2020   LDLCALC 196 (H) 12/18/2020   TRIG 194 (H) 12/18/2020   CHOLHDL 7.0 (H) 06/09/2020   Lab Results  Component Value Date   VD25OH 53.2 12/18/2020   VD25OH 23.3 (L) 06/09/2020   Lab Results  Component Value Date   WBC 6.7 06/09/2020   HGB 14.8 06/09/2020   HCT 42.7 06/09/2020   MCV 90 06/09/2020   PLT 225 06/09/2020   No results found for: IRON, TIBC, FERRITIN  Attestation Statements:   Reviewed by clinician on day of visit: allergies, medications, problem list, medical history, surgical history, family history, social history, and previous encounter notes.   Wilhemena Durie, am acting as transcriptionist for [employer], DO.  I have reviewed the above documentation for accuracy and completeness, and I  agree with the above. Marjory Sneddon, D.O.  The [place] was signed into law in 2016 which includes the topic of electronic health records.  This provides immediate access to information in MyChart.  This includes consultation notes, operative notes, office notes, lab results and pathology reports.  If you have any questions about what you read please let us know at your next visit so we can discuss your concerns and take corrective action if need be.  We are right here with you.

## 2021-02-24 ENCOUNTER — Other Ambulatory Visit: Payer: Self-pay

## 2021-02-24 ENCOUNTER — Ambulatory Visit: Payer: 59 | Admitting: Obstetrics & Gynecology

## 2021-02-24 ENCOUNTER — Encounter: Payer: Self-pay | Admitting: Obstetrics & Gynecology

## 2021-02-24 VITALS — BP 130/80 | Ht 62.0 in | Wt 172.0 lb

## 2021-02-24 DIAGNOSIS — N870 Mild cervical dysplasia: Secondary | ICD-10-CM

## 2021-02-24 NOTE — Progress Notes (Signed)
HPI:  Patient is a 60 y.o. [id number] presenting for follow up evaluation of abnormal PAP smear in the past. Prior [facility].  Her last PAP was 6 months ago and was abnormal: LGSIL . She has had a prior colposcopy. Prior biopsies (if done) were  CIN I .  She had Cryo 6 mos ago due to persistence of LGSIL/ CIN I.  PMHx: She  has a past medical history of Anemia, Anxiety, Chest pain, Constipation, Family history of breast cancer, Family history of colon cancer, Family history of ovarian cancer, Family history of ovarian cancer, Heart murmur, Heartburn, High blood pressure, Hot flashes, Hypercholesteremia, Hyperlipidemia, Insomnia, Joint pain, Liver problem, MGUS (monoclonal gammopathy of unknown significance), MGUS (monoclonal gammopathy of unknown significance), Nerve pain, Palpitations, SOB (shortness of breath) on exertion, Unspecified lump in the left breast, unspecified quadrant, and Vitamin D deficiency (2016). Also,  has a past surgical history that includes Appendectomy (1982); Abdominal hysterectomy (2001); Ovary surgery (2000); Cesarean section (1985); Colonoscopy (06/13/2012); Hysterectomy abdominal with salpingectomy; Oophorectomy; mgus; Breast biopsy (Left, 1999); Breast biopsy (Left, 03/15/2019); and Breast lumpectomy with radioactive seed localization (Left, 07/15/2020)., family history includes Alcoholism in her father; Anxiety disorder in her mother; Breast cancer in her cousin; Breast cancer (age of onset: 33) in her cousin; Cancer in her father; Colon cancer (age of onset: 70) in her father; Congestive Heart Failure in her mother; Depression in her mother; Emphysema in her mother; Heart disease in her father; Hyperlipidemia in her mother; Hypertension in her mother; Kidney failure in her mother; Neuropathy in her mother; Obesity in her mother; Ovarian cancer (age of onset: 48) in her sister.,  reports that she has never smoked. She has never used smokeless tobacco. She reports that she does not drink  alcohol and does not use drugs.  She has a current medication list which includes the following prescription(s): amlodipine, biotin, calcium, cinnamon, fiber, multiple vitamins-minerals, omega-3 fatty acids, OVER THE COUNTER MEDICATION, rosuvastatin, vitamin d (ergocalciferol), and vitamin e. Also, has No Known Allergies.  Review of Systems  All other systems reviewed and are negative.  Objective: BP 130/80   Ht 5\' 2"  (1.575 m)   Wt 172 lb (78 kg)   BMI 31.46 kg/m  Filed Weights   02/24/21 1046  Weight: 172 lb (78 kg)   Body mass index is 31.46 kg/m.  Physical examination Physical Exam Constitutional:      General: She is not in acute distress.    Appearance: She is well-developed.  Genitourinary:     Right Labia: No rash or tenderness.    Left Labia: No tenderness or rash.    No vaginal erythema or bleeding.      Right Adnexa: not tender and no mass present.    Left Adnexa: not tender and no mass present.    No cervical motion tenderness, discharge, polyp or nabothian cyst.     Cervical exam comments: Flush w vag apex.     Uterus is not enlarged.     No uterine mass detected.    Pelvic exam was performed with patient in the lithotomy position.  HENT:     Head: Normocephalic and atraumatic.     Nose: Nose normal.  Abdominal:     General: There is no distension.     Palpations: Abdomen is soft.     Tenderness: There is no abdominal tenderness.  Musculoskeletal:        General: Normal range of motion.  Neurological:     Mental Status:  She is alert and oriented to person, place, and time.     Cranial Nerves: No cranial nerve deficit.  Skin:    General: Skin is warm and dry.  Psychiatric:        Attention and Perception: Attention normal.        Mood and Affect: Mood and affect normal.        Speech: Speech normal.        Behavior: Behavior normal.        Thought Content: Thought content normal.        Judgment: Judgment normal.    ASSESSMENT:  History of  Cervical Dysplasia Prior [facility]  Plan:  1.  I discussed the grading system of pap smears and HPV high risk viral types.   2. Follow up PAP 6 months, vs intervention if high grade dysplasia identified.  A total of 20 minutes were spent face-to-face with the patient as well as preparation, review, communication, and documentation during this encounter.    Barnett Applebaum, MD, [facility], [facility] 02/24/2021  11:09 AM

## 2021-03-04 ENCOUNTER — Ambulatory Visit (INDEPENDENT_AMBULATORY_CARE_PROVIDER_SITE_OTHER): Payer: 59 | Admitting: Family Medicine

## 2021-03-04 LAB — PAP IG (IMAGE GUIDED)

## 2021-03-09 ENCOUNTER — Other Ambulatory Visit: Payer: Self-pay

## 2021-03-09 ENCOUNTER — Ambulatory Visit (INDEPENDENT_AMBULATORY_CARE_PROVIDER_SITE_OTHER): Payer: 59 | Admitting: Family Medicine

## 2021-03-09 ENCOUNTER — Encounter (INDEPENDENT_AMBULATORY_CARE_PROVIDER_SITE_OTHER): Payer: Self-pay | Admitting: Family Medicine

## 2021-03-09 VITALS — BP 122/69 | HR 58 | Temp 97.9°F | Ht 62.0 in | Wt 167.0 lb

## 2021-03-09 DIAGNOSIS — Z9189 Other specified personal risk factors, not elsewhere classified: Secondary | ICD-10-CM

## 2021-03-09 DIAGNOSIS — Z6833 Body mass index (BMI) 33.0-33.9, adult: Secondary | ICD-10-CM | POA: Insufficient documentation

## 2021-03-09 DIAGNOSIS — E8881 Metabolic syndrome: Secondary | ICD-10-CM | POA: Diagnosis not present

## 2021-03-09 DIAGNOSIS — E7849 Other hyperlipidemia: Secondary | ICD-10-CM

## 2021-03-09 DIAGNOSIS — E669 Obesity, unspecified: Secondary | ICD-10-CM | POA: Diagnosis not present

## 2021-03-09 DIAGNOSIS — E88819 Insulin resistance, unspecified: Secondary | ICD-10-CM

## 2021-03-09 DIAGNOSIS — E559 Vitamin D deficiency, unspecified: Secondary | ICD-10-CM | POA: Diagnosis not present

## 2021-03-09 MED ORDER — VITAMIN D (ERGOCALCIFEROL) 1.25 MG (50000 UNIT) PO CAPS: 50000.0000 [IU] | ORAL_CAPSULE | ORAL | 0 refills | Status: DC

## 2021-03-09 NOTE — Progress Notes (Signed)
Chief Complaint:   OBESITY Andrea Jones is here to discuss her progress with her obesity treatment plan along with follow-up of her obesity related diagnoses. Andrea Jones is on the Category 3 Plan and keeping a food journal and adhering to recommended goals of 1500-1600 calories and 110 grams of protein and states she is following her eating plan approximately 80% of the time. Andrea Jones states she is walking for 30 minutes 3 times per week.  Today's visit was #: 14 Starting weight: 183 lbs Starting date: 06/19/2020 Today's weight: 167 lbs Today's date: 03/10/2021 Total lbs lost to date: 16 lbs Total lbs lost since last in-office visit: 0  Interim History: Andrea Jones has been very busy and has not been able to adhere to the plan as well as she would have liked. She has not been planning her dinners due to lack of time. She has not been journaling consistently. She is eating 3 meals per day. She eats dinner out at least 1 time per week.   Subjective:   1. Vitamin D deficiency Andrea Jones's Vitamin D is at goal (53.2). She is on weekly prescription Vitamin D.  2. Other hyperlipidemia Andrea Jones was started on Crestor a few weeks ago. Her last LDL was elevated at 196. Her Triglycerides was elevated at 194. Her HNL was low at 37. Her ASCVD risk score is 6.2%. Her mother and grandmother had CVAs. The 10-year ASCVD risk score (Arnett DK, et al., 2019) is: 6.2%   Values used to calculate the score:     Age: 60 years     Sex: Female     Is Non-Hispanic African American: No     Diabetic: No     Tobacco smoker: No     Systolic Blood Pressure: 263 mmHg     Is BP treated: Yes     HDL Cholesterol: 37 mg/dL     Total Cholesterol: 270 mg/dL Lab Results  Component Value Date   CHOL 270 (H) 12/18/2020   HDL 37 (L) 12/18/2020   LDLCALC 196 (H) 12/18/2020   TRIG 194 (H) 12/18/2020   CHOLHDL 7.0 (H) 06/09/2020     3. Insulin resistance Andrea Jones's fasting insulin was elevated. Her A1C was 5.5.  4. At risk for heart  disease Andrea Jones is at risk for heart disease due to hyperlipidemia and obesity.  Assessment/Plan:   1. Vitamin D deficiency We will check labs today. We will refill prescription Vitamin D 50,000 IU every week and Andrea Jones will follow-up for routine testing of Vitamin D, at least 2-3 times per year to avoid over-replacement.  - VITAMIN D 25 Hydroxy (Vit-D Deficiency, Fractures) - Vitamin D, Ergocalciferol, (DRISDOL) 1.25 MG (50000 UNIT) CAPS capsule; Take 1 capsule (50,000 Units total) by mouth every 7 (seven) days.  Dispense: 4 capsule; Refill: 0  2. Other hyperlipidemia  We will check FLP today. Andrea Jones will continue with Crestor.  - Lipid Panel With LDL/HDL Ratio  3. Insulin resistance Andrea Jones will continue to work on weight loss, exercise, and decreasing simple carbohydrates to help decrease the risk of diabetes. We will check labs today. Andrea Jones agreed to follow-up with [name] as directed to closely monitor her progress.  - Comprehensive metabolic panel - Hemoglobin A1c - Insulin, random  4. At risk for heart disease Andrea Jones was given approximately 15 minutes of coronary artery disease prevention counseling today. She is 60 y.o. female and has risk factors for heart disease including obesity. We discussed intensive lifestyle modifications today with an emphasis on specific  weight loss instructions and strategies.   Repetitive spaced learning was employed today to elicit superior memory formation and behavioral change.   5. Obesity with current BMI of 30.54 Andrea Jones is currently in the action stage of change. As such, her goal is to continue with weight loss efforts. She has agreed to keeping a food journal and adhering to recommended goals of 1500-1600 calories and 90-110 grams of protein daily.   Handouts: Recipes and eating out were given today.  Exercise goals:  As is.  Behavioral modification strategies: decreasing eating out and meal planning and cooking strategies.  Andrea Jones has agreed  to follow-up with our clinic in 3 weeks.  Objective:   Blood pressure 122/69, pulse (!) 58, temperature 97.9 F (36.6 C), height 5\' 2"  (1.575 m), weight 167 lb (75.8 kg), SpO2 97 %. Body mass index is 30.54 kg/m.  General: Cooperative, alert, well developed, in no acute distress. HEENT: Conjunctivae and lids unremarkable. Cardiovascular: Regular rhythm.  Lungs: Normal work of breathing. Neurologic: No focal deficits.   Lab Results  Component Value Date   CREATININE 0.94 06/09/2020   BUN 12 06/09/2020   NA 140 06/09/2020   K 4.0 06/09/2020   CL 102 06/09/2020   CO2 23 06/09/2020   Lab Results  Component Value Date   ALT 26 12/18/2020   AST 18 12/18/2020   ALKPHOS 55 12/18/2020   BILITOT 0.5 12/18/2020   Lab Results  Component Value Date   HGBA1C 5.5 06/09/2020   HGBA1C 5.5 02/25/2019   Lab Results  Component Value Date   INSULIN 50.6 (H) 12/18/2020   INSULIN 21.1 06/09/2020   Lab Results  Component Value Date   TSH 1.610 06/09/2020   Lab Results  Component Value Date   CHOL 270 (H) 12/18/2020   HDL 37 (L) 12/18/2020   LDLCALC 196 (H) 12/18/2020   TRIG 194 (H) 12/18/2020   CHOLHDL 7.0 (H) 06/09/2020   Lab Results  Component Value Date   VD25OH 53.2 12/18/2020   VD25OH 23.3 (L) 06/09/2020   Lab Results  Component Value Date   WBC 6.7 06/09/2020   HGB 14.8 06/09/2020   HCT 42.7 06/09/2020   MCV 90 06/09/2020   PLT 225 06/09/2020   No results found for: IRON, TIBC, FERRITIN  Attestation Statements:   Reviewed by clinician on day of visit: allergies, medications, problem list, medical history, surgical history, family history, social history, and previous encounter notes.  I, Lizbeth Bark, RMA, am acting as Location manager for [employer], [place].   I have reviewed the above documentation for accuracy and completeness, and I agree with the above. -  Georgianne Fick, FNP

## 2021-03-09 NOTE — Progress Notes (Signed)
The 10-year ASCVD risk score (Arnett DK, et al., 2019) is: 6.2%   Values used to calculate the score:     Age: 60 years     Sex: Female     Is Non-Hispanic African American: No     Diabetic: No     Tobacco smoker: No     Systolic Blood Pressure: 329 mmHg     Is BP treated: Yes     HDL Cholesterol: 37 mg/dL     Total Cholesterol: 270 mg/dL

## 2021-03-10 LAB — COMPREHENSIVE METABOLIC PANEL
ALT: 29 IU/L (ref 0–32)
AST: 22 IU/L (ref 0–40)
Albumin/Globulin Ratio: 1.8 (ref 1.2–2.2)
Albumin: 5.1 g/dL — ABNORMAL HIGH (ref 3.8–4.9)
Alkaline Phosphatase: 69 IU/L (ref 44–121)
BUN/Creatinine Ratio: 13 (ref 9–23)
BUN: 12 mg/dL (ref 6–24)
Bilirubin Total: 0.5 mg/dL (ref 0.0–1.2)
CO2: 23 mmol/L (ref 20–29)
Calcium: 10 mg/dL (ref 8.7–10.2)
Chloride: 103 mmol/L (ref 96–106)
Creatinine, Ser: 0.89 mg/dL (ref 0.57–1.00)
Globulin, Total: 2.8 g/dL (ref 1.5–4.5)
Glucose: 88 mg/dL (ref 70–99)
Potassium: 4.3 mmol/L (ref 3.5–5.2)
Sodium: 142 mmol/L (ref 134–144)
Total Protein: 7.9 g/dL (ref 6.0–8.5)
eGFR: 75 mL/min/{1.73_m2} (ref >59)

## 2021-03-10 LAB — LIPID PANEL WITH LDL/HDL RATIO
Cholesterol, Total: 180 mg/dL (ref 100–199)
HDL: 44 mg/dL (ref >39)
LDL Chol Calc (NIH): 112 mg/dL — ABNORMAL HIGH (ref 0–99)
LDL/HDL Ratio: 2.5 ratio (ref 0.0–3.2)
Triglycerides: 134 mg/dL (ref 0–149)
VLDL Cholesterol Cal: 24 mg/dL (ref 5–40)

## 2021-03-10 LAB — VITAMIN D 25 HYDROXY (VIT D DEFICIENCY, FRACTURES): Vit D, 25-Hydroxy: 54 ng/mL (ref 30.0–100.0)

## 2021-03-10 LAB — HEMOGLOBIN A1C
Est. average glucose Bld gHb Est-mCnc: 103 mg/dL
Hgb A1c MFr Bld: 5.2 % (ref 4.8–5.6)

## 2021-03-10 LAB — INSULIN, RANDOM: INSULIN: 23.3 u[IU]/mL (ref 2.6–24.9)

## 2021-03-30 ENCOUNTER — Ambulatory Visit (INDEPENDENT_AMBULATORY_CARE_PROVIDER_SITE_OTHER): Payer: 59 | Admitting: Family Medicine

## 2021-03-31 ENCOUNTER — Encounter (INDEPENDENT_AMBULATORY_CARE_PROVIDER_SITE_OTHER): Payer: Self-pay

## 2021-04-06 ENCOUNTER — Ambulatory Visit (INDEPENDENT_AMBULATORY_CARE_PROVIDER_SITE_OTHER): Payer: 59 | Admitting: Family Medicine

## 2021-04-09 ENCOUNTER — Ambulatory Visit: Payer: 59 | Admitting: Oncology

## 2021-04-12 ENCOUNTER — Encounter (INDEPENDENT_AMBULATORY_CARE_PROVIDER_SITE_OTHER): Payer: Self-pay | Admitting: Family Medicine

## 2021-04-12 ENCOUNTER — Other Ambulatory Visit: Payer: Self-pay

## 2021-04-12 ENCOUNTER — Ambulatory Visit (INDEPENDENT_AMBULATORY_CARE_PROVIDER_SITE_OTHER): Payer: 59 | Admitting: Family Medicine

## 2021-04-12 VITALS — BP 125/75 | HR 71 | Temp 97.9°F | Ht 62.0 in | Wt 173.0 lb

## 2021-04-12 DIAGNOSIS — R748 Abnormal levels of other serum enzymes: Secondary | ICD-10-CM | POA: Diagnosis not present

## 2021-04-12 DIAGNOSIS — E559 Vitamin D deficiency, unspecified: Secondary | ICD-10-CM | POA: Diagnosis not present

## 2021-04-12 DIAGNOSIS — E669 Obesity, unspecified: Secondary | ICD-10-CM

## 2021-04-12 DIAGNOSIS — E7849 Other hyperlipidemia: Secondary | ICD-10-CM | POA: Diagnosis not present

## 2021-04-12 DIAGNOSIS — E8881 Metabolic syndrome: Secondary | ICD-10-CM

## 2021-04-12 DIAGNOSIS — Z9189 Other specified personal risk factors, not elsewhere classified: Secondary | ICD-10-CM

## 2021-04-12 DIAGNOSIS — Z6833 Body mass index (BMI) 33.0-33.9, adult: Secondary | ICD-10-CM

## 2021-04-12 MED ORDER — ROSUVASTATIN CALCIUM 10 MG PO TABS: 10.0000 mg | ORAL_TABLET | Freq: Every day | ORAL | 0 refills | Status: DC

## 2021-04-12 MED ORDER — VITAMIN D (ERGOCALCIFEROL) 1.25 MG (50000 UNIT) PO CAPS: 50000.0000 [IU] | ORAL_CAPSULE | ORAL | 0 refills | Status: DC

## 2021-04-12 NOTE — Progress Notes (Signed)
Chief Complaint:   OBESITY Andrea Jones is here to discuss her progress with her obesity treatment plan along with follow-up of her obesity related diagnoses. Andrea Jones is on keeping a food journal and adhering to recommended goals of 1500-1600 calories and 110 grams protein and states she is following her eating plan approximately 75% of the time. Andrea Jones states she is walking 30 minutes 2 times per week.  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 She is not eating, is skipping meals, or having quick meals. Pt wishes she had meal prepped more and would have packed her lunch more. Will review labs.  Subjective:   1. Other hyperlipidemia Discussed labs with patient today. Lorene started Crestor 5 mg on 7/20. Her lipid profile is much improved, but LDL is still a bit elevated.  2. Insulin resistance Discussed labs with patient today. Andrea Jones insulin level went from 50.6 to 23.3. Andrea Jones has a diagnosis of insulin resistance based on her elevated fasting insulin level >5. She continues to work on diet and exercise to decrease her risk of diabetes.  Lab Results  Component Value Date   INSULIN 23.3 03/09/2021   INSULIN 50.6 (H) 12/18/2020   INSULIN 21.1 06/09/2020   Lab Results  Component Value Date   HGBA1C 5.2 03/09/2021   3. Vitamin D deficiency Discussed labs with patient today. Vit D at goal. She is currently taking prescription vitamin D 50,000 IU each week. She denies nausea, vomiting or muscle weakness.  Lab Results  Component Value Date   VD25OH 54.0 03/09/2021   VD25OH 53.2 12/18/2020   VD25OH 23.3 (L) 06/09/2020   4. Elevated liver enzymes Discussed labs with patient today. Labs within normal limits despite statin  therapy. Pt has a history of elevated ALT and AST.  5. At risk for heart disease Andrea Jones is at a higher than average risk for cardiovascular disease due to obesity, insulin resistance, and hyperlipidemia.   Assessment/Plan:  No orders of the defined types were placed in this encounter.   Medications Discontinued During This Encounter  Medication Reason   rosuvastatin (CRESTOR) 10 MG tablet Reorder   Vitamin D, Ergocalciferol, (DRISDOL) 1.25 MG (50000 UNIT) CAPS capsule Reorder     Meds ordered this encounter  Medications   Vitamin D, Ergocalciferol, (DRISDOL) 1.25 MG (50000 UNIT) CAPS capsule    Sig: Take 1 capsule (50,000 Units total) by mouth every 7 (seven) days.    Dispense:  4 capsule    Refill:  0    Ov for RF   rosuvastatin (CRESTOR) 10 MG tablet    Sig: Take 1 tablet (10 mg total) by mouth at bedtime.    Dispense:  30 tablet    Refill:  0    30 d supply;  ** OV for RF **   Do not send RF request     1. Other hyperlipidemia Andrea Jones will increase Crestor to 10 mg. Cardiovascular risk and specific lipid/LDL goals reviewed.  We discussed several lifestyle modifications today and Andrea Jones will continue to work on diet, exercise and weight loss efforts. Orders and follow up as documented in patient record.   Counseling Intensive lifestyle modifications are the first line treatment for this issue. Dietary changes: Increase soluble fiber. Decrease simple carbohydrates.  Exercise changes: Moderate to vigorous-intensity aerobic activity 150 minutes per week if tolerated. Lipid-lowering medications: see documented in medical record.  Increase & Refill- rosuvastatin (CRESTOR) 10 MG tablet; Take 1 tablet (10 mg total) by mouth at bedtime.  Dispense: 30 tablet; Refill: 0  2. Insulin resistance Andrea Jones will continue prudent nutritional plan, continue to work on weight loss, exercise, and decreasing simple carbohydrates to help decrease the risk of diabetes. Andrea Jones agreed to follow-up with  Andrea Jones as directed to closely monitor her progress.  3. Vitamin D deficiency Low Vitamin D level contributes to fatigue and are associated with obesity, breast, and colon cancer. She agrees to continue to take prescription Vitamin D 50,000 IU every week going into the winter and will follow-up for routine testing of Vitamin D, at least 2-3 times per year to avoid over-replacement.  Refill- Vitamin D, Ergocalciferol, (DRISDOL) 1.25 MG (50000 UNIT) CAPS capsule; Take 1 capsule (50,000 Units total) by mouth every 7 (seven) days.  Dispense: 4 capsule; Refill: 0  4. Elevated liver enzymes We discussed the likely diagnosis of non-alcoholic fatty liver disease today and how this condition is obesity related. Andrea Jones was educated the importance of weight loss. Andrea Jones agreed to continue with her weight loss efforts with healthier diet and exercise as an essential part of her treatment plan. Continue treatment plan.  5. At risk for heart disease Andrea Jones was given approximately 9 minutes of coronary artery disease prevention counseling today. She is 60 y.o. female and has risk factors for heart disease including obesity. We discussed intensive lifestyle modifications today with an emphasis on specific weight loss instructions and strategies.   Repetitive spaced learning was employed today to elicit superior memory formation and behavioral change.  6. Obesity with current BMI of 31.8  Andrea Jones is currently in the action stage of change. As such, her goal is to continue with weight loss efforts. She has agreed to Change to practicing portion control and making smarter food choices, such as increasing vegetables and decreasing simple carbohydrates.   Exercise goals: For substantial health benefits, adults should do at least 150 minutes (2 hours and 30 minutes) a week of moderate-intensity, or 75 minutes (1 hour and 15 minutes) a week of vigorous-intensity aerobic physical activity, or an equivalent combination of  moderate- and vigorous-intensity aerobic activity. Aerobic activity should be performed in episodes of at least 10 minutes, and preferably, it should be spread throughout the week.  Behavioral modification strategies: planning for success.  Andrea Jones has agreed to follow-up with our clinic in 3 weeks. She was informed of the importance of frequent follow-up visits to maximize her success with intensive lifestyle modifications for her multiple health conditions.   Objective:   Blood pressure 125/75, pulse 71, temperature 97.9 F (36.6 C), height 5\' 2"  (1.575 m), weight 173 lb (78.5 kg), SpO2 97 %. Body mass index is 31.64 kg/m.  General: Cooperative, alert, well developed, in no acute distress. HEENT: Conjunctivae and lids unremarkable. Cardiovascular: Regular rhythm.  Lungs: Normal work of breathing. Neurologic: No focal deficits.   Lab Results  Component Value Date   CREATININE 0.89 03/09/2021   BUN 12 03/09/2021   NA 142 03/09/2021   K 4.3 03/09/2021   CL 103 03/09/2021   CO2 23 03/09/2021   Lab Results  Component Value Date   ALT 29 03/09/2021   AST 22 03/09/2021   ALKPHOS 69 03/09/2021   BILITOT 0.5 03/09/2021   Lab Results  Component Value Date   HGBA1C 5.2 03/09/2021  HGBA1C 5.5 06/09/2020   HGBA1C 5.5 02/25/2019   Lab Results  Component Value Date   INSULIN 23.3 03/09/2021   INSULIN 50.6 (H) 12/18/2020   INSULIN 21.1 06/09/2020   Lab Results  Component Value Date   TSH 1.610 06/09/2020   Lab Results  Component Value Date   CHOL 180 03/09/2021   HDL 44 03/09/2021   LDLCALC 112 (H) 03/09/2021   TRIG 134 03/09/2021   CHOLHDL 7.0 (H) 06/09/2020   Lab Results  Component Value Date   VD25OH 54.0 03/09/2021   VD25OH 53.2 12/18/2020   VD25OH 23.3 (L) 06/09/2020   Lab Results  Component Value Date   WBC 6.7 06/09/2020   HGB 14.8 06/09/2020   HCT 42.7 06/09/2020   MCV 90 06/09/2020   PLT 225 06/09/2020    Attestation Statements:   Reviewed by  clinician on day of visit: allergies, medications, problem list, medical history, surgical history, family history, social history, and previous encounter notes.  Coral Ceo, CMA, am acting as transcriptionist for [employer], DO.  I have reviewed the above documentation for accuracy and completeness, and I agree with the above. Marjory Sneddon, D.O.  The [place] was signed into law in 2016 which includes the topic of electronic health records.  This provides immediate access to information in MyChart.  This includes consultation notes, operative notes, office notes, lab results and pathology reports.  If you have any questions about what you read please let Andrea Jones know at your next visit so we can discuss your concerns and take corrective action if need be.  We are right here with you.

## 2021-04-13 ENCOUNTER — Encounter: Payer: Self-pay | Admitting: Oncology

## 2021-04-14 ENCOUNTER — Encounter: Payer: Self-pay | Admitting: Oncology

## 2021-04-15 ENCOUNTER — Encounter: Payer: Self-pay | Admitting: Oncology

## 2021-04-19 ENCOUNTER — Encounter: Payer: Self-pay | Admitting: Oncology

## 2021-04-19 ENCOUNTER — Other Ambulatory Visit: Payer: Self-pay

## 2021-04-19 ENCOUNTER — Inpatient Hospital Stay: Payer: 59 | Attending: Oncology | Admitting: Oncology

## 2021-04-19 VITALS — BP 130/70 | HR 70 | Temp 97.4°F | Resp 18 | Wt 179.3 lb

## 2021-04-19 DIAGNOSIS — E785 Hyperlipidemia, unspecified: Secondary | ICD-10-CM | POA: Diagnosis not present

## 2021-04-19 DIAGNOSIS — E559 Vitamin D deficiency, unspecified: Secondary | ICD-10-CM | POA: Insufficient documentation

## 2021-04-19 DIAGNOSIS — G4700 Insomnia, unspecified: Secondary | ICD-10-CM | POA: Insufficient documentation

## 2021-04-19 DIAGNOSIS — D472 Monoclonal gammopathy: Secondary | ICD-10-CM | POA: Diagnosis present

## 2021-04-19 DIAGNOSIS — E7800 Pure hypercholesterolemia, unspecified: Secondary | ICD-10-CM | POA: Insufficient documentation

## 2021-04-19 DIAGNOSIS — R200 Anesthesia of skin: Secondary | ICD-10-CM | POA: Diagnosis not present

## 2021-04-19 DIAGNOSIS — N6489 Other specified disorders of breast: Secondary | ICD-10-CM | POA: Insufficient documentation

## 2021-04-19 DIAGNOSIS — Z8041 Family history of malignant neoplasm of ovary: Secondary | ICD-10-CM | POA: Insufficient documentation

## 2021-04-19 DIAGNOSIS — Z800 Family history of malignant neoplasm of digestive organs: Secondary | ICD-10-CM | POA: Diagnosis not present

## 2021-04-19 DIAGNOSIS — Z803 Family history of malignant neoplasm of breast: Secondary | ICD-10-CM | POA: Diagnosis not present

## 2021-04-19 NOTE — Progress Notes (Signed)
Patient here for follow up. Pt reports eyesight changes, unsure if it can be related to diagnosis.

## 2021-04-23 ENCOUNTER — Encounter: Payer: Self-pay | Admitting: Oncology

## 2021-04-24 NOTE — Progress Notes (Signed)
Hematology/Oncology follow up  note [phone] [phone]   Patient Care Team: Leonel Ramsay, MD as PCP - General (Infectious Diseases) [name], MD as PCP - OBGYN (Obstetrics and Gynecology) Ammie Dalton, Okey Regal, MD (Unknown Physician Specialty) Bary Castilla Forest Gleason, MD (General Surgery)  REFERRING PROVIDER: Leonel Ramsay, MD  CHIEF COMPLAINTS/REASON FOR VISIT:  Follow-up for MGUS  HISTORY OF PRESENTING ILLNESS:  Andrea Jones is a 60 y.o. female presents for follow-up of MGUS.  neurology work-up for foot numbness and tingling.Marland Kitchen  SPEP showed 0.9 g/dL M spike,   , and IFE showed IgG Kappa monoclonal protein.  No aggravating or alleviated factors.  Associated signs or symptoms:  Neuropathy: Toe numbness and tingling. Denies weight loss, fever chills, bone pain, Bone pain: Denies Anemia denies  Patient also recently had an abnormal screening mammogram on 02/22/2019 and the patient was called back to perform unilateral left diagnostic breast mammogram on 03/06/2019 Showed irregular hypoechoic mass 0.6 x 0.4 x 0.7 cm in the left breast is suspicious for malignancy.  Axillary lymph node status was not commented on mammogram or targeted ultrasound. Patient is status post left breast mass biopsy-03/15/2019, biopsy showed radial scar/complex sclerosing lesion.  # 03/15/2019 left breast biopsy showed radial scar/complex sclerosing lesion # Radial scar/complex sclerosing lesion, status post resection.  Family history of ovarian cancer and breast cancer.  Patient has met genetic counselor and genetic testing is negative.  Pregnancy / Birth History Age at menarche   28 years. Age of menopause   46-50 Gravida   3 Maternal age   2-25 Para   88  INTERVAL HISTORY Andrea Jones is a 60 y.o. female who has above history reviewed by me today presents for follow up visit for management of MGUS, history of radial scar Patient has no new  complaints.    Review of Systems  Constitutional:  Negative for appetite change, chills, fatigue and fever.  HENT:   Negative for hearing loss and voice change.   Eyes:  Negative for eye problems.  Respiratory:  Negative for chest tightness and cough.   Cardiovascular:  Negative for chest pain.  Gastrointestinal:  Negative for abdominal distention, abdominal pain and blood in stool.  Endocrine: Negative for hot flashes.  Genitourinary:  Negative for difficulty urinating and frequency.   Musculoskeletal:  Negative for arthralgias.  Skin:  Negative for itching and rash.  Neurological:  Negative for extremity weakness.  Hematological:  Negative for adenopathy.  Psychiatric/Behavioral:  Negative for confusion.     MEDICAL HISTORY:  Past Medical History:  Diagnosis Date   Anemia    Anxiety    Chest pain    Constipation    Family history of breast cancer    Family history of colon cancer    Family history of ovarian cancer    8/19 and 9/21 cancer genetic testing letter sent   Family history of ovarian cancer    Heart murmur    hx of per pt, "hasn't presented itself in years"   Heartburn    High blood pressure    Hot flashes    "pretty much gone"   Hypercholesteremia    Hyperlipidemia    Insomnia    Joint pain    Liver problem    MGUS (monoclonal gammopathy of unknown significance)    sees Dr. Tasia Catchings with hematology   MGUS (monoclonal gammopathy of unknown significance)    Nerve pain    in hands and feet  Palpitations    SOB (shortness of breath) on exertion    Unspecified lump in the left breast, unspecified quadrant    surgery scheduled for 07/15/20   Vitamin D deficiency 2016    SURGICAL HISTORY: Past Surgical History:  Procedure Laterality Date   ABDOMINAL HYSTERECTOMY  2001   APPENDECTOMY  1982   BREAST BIOPSY Left 1999   cyst,benign   BREAST BIOPSY Left 03/15/2019   [name] bx venus clip radial scar   BREAST LUMPECTOMY WITH RADIOACTIVE SEED LOCALIZATION Left  07/15/2020   Procedure: LEFT BREAST LUMPECTOMY WITH RADIOACTIVE SEED LOCALIZATION;  Surgeon: Jovita Kussmaul, MD;  Location: [facility];  Service: General;  Laterality: Left;   [place]   COLONOSCOPY  06/13/2012   [place]. Normal Exam.   HYSTERECTOMY ABDOMINAL WITH SALPINGECTOMY     mgus     OOPHORECTOMY     OVARY SURGERY  2000    SOCIAL HISTORY: Social History   Socioeconomic History   Marital status: Married    Spouse name: Not on file   Number of children: 3   Years of education: Not on file   Highest education level: Not on file  Occupational History   Occupation: Civil engineer, contracting  Tobacco Use   Smoking status: Never   Smokeless tobacco: Never  Vaping Use   Vaping Use: Never used  Substance and Sexual Activity   Alcohol use: No    Alcohol/week: 0.0 standard drinks   Drug use: No   Sexual activity: Not Currently  Other Topics Concern   Not on file  Social History Narrative   Lives at home with husband    Right handed   Caffeine: maybe 1 cup maybe 3 days a week   Social Determinants of Radio broadcast assistant Strain: Not on file  Food Insecurity: Not on file  Transportation Needs: Not on file  Physical Activity: Not on file  Stress: Not on file  Social Connections: Not on file  Intimate Partner Violence: Not on file    FAMILY HISTORY: Family History  Problem Relation Age of Onset   Colon cancer Father 37   Heart disease Father    Cancer Father    Alcoholism Father    Congestive Heart Failure Mother    Emphysema Mother    Hypertension Mother    Kidney failure Mother    Neuropathy Mother    Hyperlipidemia Mother    Depression Mother    Anxiety disorder Mother    Obesity Mother    Ovarian cancer Sister 75       pat 1/2 sister   Breast cancer Cousin 16   Breast cancer Cousin     ALLERGIES:  has No Known Allergies.  MEDICATIONS:  Current Outpatient Medications  Medication Sig Dispense Refill   amLODipine  (NORVASC) 10 MG tablet Take 1 tablet (10 mg total) by mouth daily.     Biotin 10000 MCG TABS Take 1 each by mouth daily.     CALCIUM PO Take 500 mg by mouth daily.     Cinnamon 500 MG capsule Take 500 mg by mouth daily.     FIBER PO Take by mouth. Dietary fiber- 6 grams soluble fiber- 6 grams Chromium Picolinate- 200 mcg     Multiple Vitamins-Minerals (WOMENS MULTIVITAMIN PO) Take by mouth.     Omega-3 Fatty Acids (OMEGA 3 PO) Take 1,200 mg by mouth daily.     OVER THE COUNTER MEDICATION daily. IMMUNE PLUS: Vitamin C-  180 mg  Vitamin D- 25 mcg Zinc- 5 mg  Cinnamon 1000 mg Vitamin E 180 mg Turmeric Vital proteins collagen peptides Super food green + probiotics     rosuvastatin (CRESTOR) 10 MG tablet Take 1 tablet (10 mg total) by mouth at bedtime. 30 tablet 0   Vitamin D, Ergocalciferol, (DRISDOL) 1.25 MG (50000 UNIT) CAPS capsule Take 1 capsule (50,000 Units total) by mouth every 7 (seven) days. 4 capsule 0   vitamin E 1000 UNIT capsule Take 1,000 Units by mouth daily.     No current facility-administered medications for this visit.     PHYSICAL EXAMINATION: ECOG PERFORMANCE STATUS: 0 - Asymptomatic Vitals:   04/19/21 1431  BP: 130/70  Pulse: 70  Resp: 18  Temp: (!) 97.4 F (36.3 C)   Filed Weights   04/19/21 1431  Weight: 179 lb 4.8 oz (81.3 kg)    Physical Exam Constitutional:      General: She is not in acute distress. HENT:     Head: Normocephalic and atraumatic.  Eyes:     General: No scleral icterus.    Pupils: Pupils are equal, round, and reactive to light.  Cardiovascular:     Rate and Rhythm: Normal rate and regular rhythm.     Heart sounds: Normal heart sounds.  Pulmonary:     Effort: Pulmonary effort is normal. No respiratory distress.     Breath sounds: No wheezing.  Abdominal:     General: Bowel sounds are normal. There is no distension.     Palpations: Abdomen is soft. There is no mass.     Tenderness: There is no abdominal tenderness.   Musculoskeletal:        General: No deformity. Normal range of motion.     Cervical back: Normal range of motion and neck supple.  Skin:    General: Skin is warm and dry.     Findings: No erythema or rash.  Neurological:     Mental Status: She is alert and oriented to person, place, and time.     Cranial Nerves: No cranial nerve deficit.     Coordination: Coordination normal.  Psychiatric:        Behavior: Behavior normal.        Thought Content: Thought content normal.     LABORATORY DATA:  I have reviewed the data as listed Lab Results  Component Value Date   WBC 6.7 06/09/2020   HGB 14.8 06/09/2020   HCT 42.7 06/09/2020   MCV 90 06/09/2020   PLT 225 06/09/2020   Recent Labs    05/05/20 1252 06/09/20 0856 12/18/20 0717 03/09/21 1048  NA 138 140  --  142  K 4.0 4.0  --  4.3  CL 105 102  --  103  CO2 22 23  --  23  GLUCOSE 99 94  --  88  BUN 15 12  --  12  CREATININE 0.76 0.94  --  0.89  CALCIUM 9.5 10.0  --  10.0  GFRNONAA >60 67  --   --   GFRAA  --  77  --   --   PROT 8.3* 7.7 7.2 7.9  ALBUMIN 4.4 4.6 4.5 5.1*  AST 45* 30 18 22   ALT 54* 47* 26 29  ALKPHOS 49 60 55 69  BILITOT 1.1 0.5 0.5 0.5  BILIDIR  --   --  0.13  --     Iron/TIBC/Ferritin/ %Sat No results found for: IRON, TIBC, FERRITIN, IRONPCTSAT   09/27/19  M protein 0.9 04/01/2020 M protein 1.1 09/29/2020 M protein 1, IgG 1415, hemoglobin 14.2, creatinine 0.95 04/12/2021, M protein 0.9, IgG 1495, hemoglobin 14.7, creatinine 0.95, free light chain ratio 3.6. RADIOGRAPHIC STUDIES: I have personally reviewed the radiological images as listed and agreed with the findings in the report.  No results found.   ASSESSMENT & PLAN:  1. MGUS (monoclonal gammopathy of unknown significance)   2. Radial scar of breast   # IgG MGUS Labs are reviewed and discussed with patient. Patient has stable kidney function and a stable M protein Blood work was done through [employer] [employer] reviewed and discussed with  patient Stable kidney function and stable M protein level at 0.9, stable free light chain ratio 3.6. Recommend continue follow-up and blood work every 6 months.  [facility] prescription was provided.  #Radial scar/complex sclerosing lesion, status post resection.  Genetic testing negative.  Annual mammogram, patient will get via primary care provider's office.  [facility] prescription for next visit in 6 months. All questions were answered. The patient knows to call the clinic with any problems questions or concerns.   Return of visit: 6 months  Earlie Server, MD, PhD 04/24/2021

## 2021-05-03 ENCOUNTER — Other Ambulatory Visit: Payer: Self-pay

## 2021-05-03 ENCOUNTER — Encounter (INDEPENDENT_AMBULATORY_CARE_PROVIDER_SITE_OTHER): Payer: Self-pay | Admitting: Family Medicine

## 2021-05-03 ENCOUNTER — Ambulatory Visit (INDEPENDENT_AMBULATORY_CARE_PROVIDER_SITE_OTHER): Payer: 59 | Admitting: Family Medicine

## 2021-05-03 VITALS — BP 137/77 | HR 63 | Temp 97.7°F | Ht 62.0 in | Wt 175.0 lb

## 2021-05-03 DIAGNOSIS — E7849 Other hyperlipidemia: Secondary | ICD-10-CM

## 2021-05-03 DIAGNOSIS — Z6833 Body mass index (BMI) 33.0-33.9, adult: Secondary | ICD-10-CM

## 2021-05-03 DIAGNOSIS — E559 Vitamin D deficiency, unspecified: Secondary | ICD-10-CM | POA: Diagnosis not present

## 2021-05-03 DIAGNOSIS — I10 Essential (primary) hypertension: Secondary | ICD-10-CM | POA: Diagnosis not present

## 2021-05-03 DIAGNOSIS — E669 Obesity, unspecified: Secondary | ICD-10-CM | POA: Diagnosis not present

## 2021-05-03 DIAGNOSIS — Z9189 Other specified personal risk factors, not elsewhere classified: Secondary | ICD-10-CM

## 2021-05-03 MED ORDER — ROSUVASTATIN CALCIUM 10 MG PO TABS: 10.0000 mg | ORAL_TABLET | Freq: Every day | ORAL | 0 refills | Status: DC

## 2021-05-03 MED ORDER — VITAMIN D (ERGOCALCIFEROL) 1.25 MG (50000 UNIT) PO CAPS: 50000.0000 [IU] | ORAL_CAPSULE | ORAL | 0 refills | Status: DC

## 2021-05-03 MED ORDER — AMLODIPINE BESYLATE 10 MG PO TABS: 10.0000 mg | ORAL_TABLET | Freq: Every day | ORAL | 11 refills | Status: DC

## 2021-05-03 NOTE — Progress Notes (Signed)
Chief Complaint:   OBESITY Andrea Jones is here to discuss her progress with her obesity treatment plan along with follow-up of her obesity related diagnoses. Andrea Jones is on practicing portion control and making smarter food choices, such as increasing vegetables and decreasing simple carbohydrates and states she is following her eating plan approximately 70% of the time. Andrea Jones states she is walking 30 minutes 2 times per week.  Today's visit was #: 23 Starting weight: 183 lbs Starting date: 06/09/2020 Today's weight: 175 lbs Today's date: 05/03/2021 Total lbs lost to date: 8 Total lbs lost since last in-office visit: +2  Interim History: Andrea Jones had a birthday and the holiday recently. She is not surprised she gained weight and knows she can easily lose it. We changed her meal plan at last OV to [place]. She likes the plan and wants to continue it.  Subjective:   1. Essential hypertension BP essentially at goal. Pt took BP meds late this morning. She is not checking her BP at home because she feels well and has no concerns or symptoms.  2. Other hyperlipidemia Crestor dose was increased at last OV and pt is tolerating it well.  3. Vitamin D deficiency She is currently taking prescription vitamin D 50,000 IU each week. She denies nausea, vomiting or muscle weakness.  4. At risk for heart disease Andrea Jones is at a higher than average risk for cardiovascular disease due to obesity.   Assessment/Plan:  No orders of the defined types were placed in this encounter.   Medications Discontinued During This Encounter  Medication Reason   amLODipine (NORVASC) 10 MG tablet Reorder   Vitamin D, Ergocalciferol, (DRISDOL) 1.25 MG (50000 UNIT) CAPS capsule Reorder   rosuvastatin (CRESTOR) 10 MG tablet Reorder     Meds ordered this encounter  Medications   rosuvastatin (CRESTOR) 10 MG tablet    Sig: Take 1 tablet (10 mg total) by mouth at bedtime.    Dispense:  30 tablet    Refill:  0    30 d  supply;  ** OV for RF **   Do not send RF request   Vitamin D, Ergocalciferol, (DRISDOL) 1.25 MG (50000 UNIT) CAPS capsule    Sig: Take 1 capsule (50,000 Units total) by mouth every 7 (seven) days.    Dispense:  4 capsule    Refill:  0    Ov for RF   amLODipine (NORVASC) 10 MG tablet    Sig: Take 1 tablet (10 mg total) by mouth daily.    Dispense:  30 tablet    Refill:  11     1. Essential hypertension Andrea Jones is working on healthy weight loss and exercise to improve blood pressure control. We will watch for signs of hypotension as she continues her lifestyle modifications. Continue amlodipine at same dose.  Refill- amLODipine (NORVASC) 10 MG tablet; Take 1 tablet (10 mg total) by mouth daily.  Dispense: 30 tablet; Refill: 11  2. Other hyperlipidemia Cardiovascular risk and specific lipid/LDL goals reviewed.  We discussed several lifestyle modifications today and Andrea Jones will continue to work on diet, exercise and weight loss efforts. Orders and follow up as documented in patient record. Continue Crestor and repeat CMP in early January.  Counseling Intensive lifestyle modifications are the first line treatment for this issue. Dietary changes: Increase soluble fiber. Decrease simple carbohydrates. Exercise changes: Moderate to vigorous-intensity aerobic activity 150 minutes per week if tolerated. Lipid-lowering medications: see documented in medical record.  Refill- rosuvastatin (CRESTOR) 10 MG  tablet; Take 1 tablet (10 mg total) by mouth at bedtime.  Dispense: 30 tablet; Refill: 0  3. Vitamin D deficiency Low Vitamin D level contributes to fatigue and are associated with obesity, breast, and colon cancer. She agrees to continue to take prescription Vitamin D 50,000 IU every week and will follow-up for routine testing of Vitamin D, at least 2-3 times per year to avoid over-replacement.  Refill- Vitamin D, Ergocalciferol, (DRISDOL) 1.25 MG (50000 UNIT) CAPS capsule; Take 1 capsule (50,000  Units total) by mouth every 7 (seven) days.  Dispense: 4 capsule; Refill: 0  4. At risk for heart disease Due to Andrea Jones's current state of health and medical condition(s), she is at a higher risk for heart disease.  This puts the patient at much greater risk to subsequently develop cardiopulmonary conditions that can significantly affect patient's quality of life in a negative manner.    At least 9 minutes were spent on counseling Andrea Jones about these concerns today, and I stressed the importance of reversing risks factors of obesity, especially truncal and visceral fat, hypertension, hyperlipidemia, and pre-diabetes.  The initial goal is to lose at least 5-10% of starting weight to help reduce these risk factors.  Counseling:  Intensive lifestyle modifications were discussed with [name] as the most appropriate first line of treatment.  she will continue to work on diet, exercise, and weight loss efforts.  We will continue to reassess these conditions on a fairly regular basis in an attempt to decrease the patient's overall morbidity and mortality.  Evidence-based interventions for health behavior change were utilized today including the discussion of self monitoring techniques, problem-solving barriers, and SMART goal setting techniques.  Specifically, regarding patient's less desirable eating habits and patterns, we employed the technique of small changes when Andrea Jones has not been able to fully commit to her prudent nutritional plan.  5. Obesity with current BMI of 32.0  Andrea Jones is currently in the action stage of change. As such, her goal is to continue with weight loss efforts. She has agreed to practicing portion control and making smarter food choices, such as increasing vegetables and decreasing simple carbohydrates.   Use new fit bit to reach goal of 5,000 steps per day for next OV.  Get a pill box to stay organized with medication.  Exercise goals:  As is- Eventual goal of 10,000 steps per  day.  Behavioral modification strategies: increasing lean protein intake, decreasing simple carbohydrates, and planning for success.  Andrea Jones has agreed to follow-up with our clinic in 3 weeks. She was informed of the importance of frequent follow-up visits to maximize her success with intensive lifestyle modifications for her multiple health conditions.   Objective:   Blood pressure 137/77, pulse 63, temperature 97.7 F (36.5 C), height 5\' 2"  (1.575 m), weight 175 lb (79.4 kg), SpO2 98 %. Body mass index is 32.01 kg/m.  General: Cooperative, alert, well developed, in no acute distress. HEENT: Conjunctivae and lids unremarkable. Cardiovascular: Regular rhythm.  Lungs: Normal work of breathing. Neurologic: No focal deficits.   Lab Results  Component Value Date   CREATININE 0.89 03/09/2021   BUN 12 03/09/2021   NA 142 03/09/2021   K 4.3 03/09/2021   CL 103 03/09/2021   CO2 23 03/09/2021   Lab Results  Component Value Date   ALT 29 03/09/2021   AST 22 03/09/2021   ALKPHOS 69 03/09/2021   BILITOT 0.5 03/09/2021   Lab Results  Component Value Date   HGBA1C 5.2 03/09/2021  HGBA1C 5.5 06/09/2020   HGBA1C 5.5 02/25/2019   Lab Results  Component Value Date   INSULIN 23.3 03/09/2021   INSULIN 50.6 (H) 12/18/2020   INSULIN 21.1 06/09/2020   Lab Results  Component Value Date   TSH 1.610 06/09/2020   Lab Results  Component Value Date   CHOL 180 03/09/2021   HDL 44 03/09/2021   LDLCALC 112 (H) 03/09/2021   TRIG 134 03/09/2021   CHOLHDL 7.0 (H) 06/09/2020   Lab Results  Component Value Date   VD25OH 54.0 03/09/2021   VD25OH 53.2 12/18/2020   VD25OH 23.3 (L) 06/09/2020   Lab Results  Component Value Date   WBC 6.7 06/09/2020   HGB 14.8 06/09/2020   HCT 42.7 06/09/2020   MCV 90 06/09/2020   PLT 225 06/09/2020    Attestation Statements:   Reviewed by clinician on day of visit: allergies, medications, problem list, medical history, surgical history, family  history, social history, and previous encounter notes.  Coral Ceo, CMA, am acting as transcriptionist for [employer], DO.  I have reviewed the above documentation for accuracy and completeness, and I agree with the above. Marjory Sneddon, D.O.  The [place] was signed into law in 2016 which includes the topic of electronic health records.  This provides immediate access to information in MyChart.  This includes consultation notes, operative notes, office notes, lab results and pathology reports.  If you have any questions about what you read please let us know at your next visit so we can discuss your concerns and take corrective action if need be.  We are right here with you.

## 2021-05-24 ENCOUNTER — Ambulatory Visit (INDEPENDENT_AMBULATORY_CARE_PROVIDER_SITE_OTHER): Payer: 59 | Admitting: Family Medicine

## 2021-05-24 ENCOUNTER — Encounter (INDEPENDENT_AMBULATORY_CARE_PROVIDER_SITE_OTHER): Payer: Self-pay | Admitting: Family Medicine

## 2021-05-24 ENCOUNTER — Other Ambulatory Visit: Payer: Self-pay

## 2021-05-24 VITALS — BP 130/80 | HR 71 | Temp 97.7°F | Ht 62.0 in | Wt 179.0 lb

## 2021-05-24 DIAGNOSIS — Z6833 Body mass index (BMI) 33.0-33.9, adult: Secondary | ICD-10-CM

## 2021-05-24 DIAGNOSIS — E559 Vitamin D deficiency, unspecified: Secondary | ICD-10-CM

## 2021-05-24 DIAGNOSIS — E7849 Other hyperlipidemia: Secondary | ICD-10-CM | POA: Diagnosis not present

## 2021-05-24 DIAGNOSIS — I10 Essential (primary) hypertension: Secondary | ICD-10-CM

## 2021-05-24 DIAGNOSIS — E8881 Metabolic syndrome: Secondary | ICD-10-CM

## 2021-05-24 DIAGNOSIS — Z9189 Other specified personal risk factors, not elsewhere classified: Secondary | ICD-10-CM

## 2021-05-24 DIAGNOSIS — E669 Obesity, unspecified: Secondary | ICD-10-CM

## 2021-05-24 MED ORDER — METFORMIN HCL 500 MG PO TABS: ORAL_TABLET | ORAL | 0 refills | Status: DC

## 2021-05-24 MED ORDER — ROSUVASTATIN CALCIUM 10 MG PO TABS: 10.0000 mg | ORAL_TABLET | Freq: Every day | ORAL | 0 refills | Status: DC

## 2021-05-24 MED ORDER — VITAMIN D (ERGOCALCIFEROL) 1.25 MG (50000 UNIT) PO CAPS: 50000.0000 [IU] | ORAL_CAPSULE | ORAL | 0 refills | Status: DC

## 2021-05-25 NOTE — Progress Notes (Signed)
Chief Complaint:   OBESITY Andrea Jones is here to discuss her progress with her obesity treatment plan along with follow-up of her obesity related diagnoses. Andrea Jones is on practicing portion control and making smarter food choices, such as increasing vegetables and decreasing simple carbohydrates and states she is following her eating plan approximately 75% of the time. Andrea Jones states she is walking for 30 minutes 2 times per week.  Today's visit was #: 63 Starting weight: 183 lbs Starting date: 06/19/2020 Today's weight: 179 lbs Today's date: 05/24/2021 Total lbs lost to date: 4 Total lbs lost since last in-office visit: 0  Interim History: Andrea Jones states that "she didn't make healthy choices". There's a lot of treats and sweets around now with the holidays. Other than "sweets treats", she is following the meal plan 70-75%.   Subjective:   1. Essential hypertension Andrea Jones is asymptomatic and she has no concerns. She is taking Norvasc, and she did get a pill box recently which has helped a lot with compliance.    BP Readings from Last 3 Encounters:  05/24/21 130/80  05/03/21 137/77  04/19/21 130/70   2. Vitamin D deficiency Andrea Jones is currently taking prescription vitamin D 50,000 IU each week. She denies nausea, vomiting or muscle weakness.  3. Other hyperlipidemia Andrea Jones has been on higher dose for 1 month or more. She has no side effects and compliance good.   4. Insulin resistance Andrea Jones is eating a lot more carbohydrates than usual lately. We discussed metformin in the past and she declined, but she would like to start today.   5. At risk for diabetes mellitus Andrea Jones is at higher than average risk for developing diabetes due to insulin resistance.   Assessment/Plan:  No orders of the defined types were placed in this encounter.   Medications Discontinued During This Encounter  Medication Reason   rosuvastatin (CRESTOR) 10 MG tablet Reorder   Vitamin D, Ergocalciferol,  (DRISDOL) 1.25 MG (50000 UNIT) CAPS capsule Reorder     Meds ordered this encounter  Medications   rosuvastatin (CRESTOR) 10 MG tablet    Sig: Take 1 tablet (10 mg total) by mouth at bedtime.    Dispense:  30 tablet    Refill:  0    30 d supply;  ** OV for RF **   Do not send RF request   Vitamin D, Ergocalciferol, (DRISDOL) 1.25 MG (50000 UNIT) CAPS capsule    Sig: Take 1 capsule (50,000 Units total) by mouth every 7 (seven) days.    Dispense:  4 capsule    Refill:  0    Ov for RF   metFORMIN (GLUCOPHAGE) 500 MG tablet    Sig: 500mg  po daily with lunch    Dispense:  30 tablet    Refill:  0     1. Essential hypertension Andrea Jones's blood pressure is at goal. We will follow up at her next office visit.  - Counseled Andrea Jones on pathophysiology of disease and discussed treatment plan, which always includes dietary and lifestyle modification as first line.  - Lifestyle changes such as following our low salt, heart healthy meal plan and engaging in a regular exercise program discussed  - Avoid buying foods that are: processed, frozen, or prepackaged to avoid excess salt. - Ambulatory blood pressure monitoring encouraged.  Reminded patient that if they ever feel poorly in any way, to check their blood pressure and pulse as well. - We will continue to monitor closely alongside PCP/ specialists.  Pt reminded to also f/up with those individuals as instructed by them.  - We will continue to monitor symptoms as they relate to the her weight loss journey.  2. Vitamin D deficiency We will refill prescription Vitamin D 50,000 IU every week for 1 month. Andrea Jones will follow-up for routine testing of Vitamin D, at least 2-3 times per year to avoid over-replacement.  - Vitamin D, Ergocalciferol, (DRISDOL) 1.25 MG (50000 UNIT) CAPS capsule; Take 1 capsule (50,000 Units total) by mouth every 7 (seven) days.  Dispense: 4 capsule; Refill: 0  3. Other hyperlipidemia Cardiovascular risk and  specific lipid/LDL goals reviewed. We discussed several lifestyle modifications today. We will refill Crestor for 1 month. Andrea Jones will continue to work on diet, exercise and weight loss efforts. Orders and follow up as documented in patient record.   Counseling Intensive lifestyle modifications are the first line treatment for this issue. Dietary changes: Increase soluble fiber. Decrease simple carbohydrates. Exercise changes: Moderate to vigorous-intensity aerobic activity 150 minutes per week if tolerated. Lipid-lowering medications: see documented in medical record.  - rosuvastatin (CRESTOR) 10 MG tablet; Take 1 tablet (10 mg total) by mouth at bedtime.  Dispense: 30 tablet; Refill: 0  4. Insulin resistance Andrea Jones agreed to start metformin 500 mg with lunch daily, with no refills. She will continue to work on weight loss, exercise, and decreasing simple carbohydrates to help decrease the risk of diabetes. Handouts were given today. Andrea Jones agreed to follow-up with [name] as directed to closely monitor her progress.  - metFORMIN (GLUCOPHAGE) 500 MG tablet; 500mg  po daily with lunch  Dispense: 30 tablet; Refill: 0  5. At risk for diabetes mellitus Andrea Jones was given approximately 9 minutes of diabetes education and counseling today. We discussed intensive lifestyle modifications today with an emphasis on weight loss as well as increasing exercise and decreasing simple carbohydrates in her diet. We also reviewed medication options with an emphasis on risk versus benefit of those discussed.   Repetitive spaced learning was employed today to elicit superior memory formation and behavioral change.  6. Obesity with current BMI of 32.7 Andrea Jones is currently in the action stage of change. As such, her goal is to continue with weight loss efforts. She has agreed to practicing portion control and making smarter food choices, such as increasing vegetables and decreasing simple carbohydrates.   Exercise goals:  Increase walking to 30 minutes 5 days per week in the mornings.  Behavioral modification strategies: holiday eating strategies  and avoiding temptations.  Andrea Jones has agreed to follow-up with our clinic in 3 weeks. She was informed of the importance of frequent follow-up visits to maximize her success with intensive lifestyle modifications for her multiple health conditions.   Objective:   Blood pressure 130/80, pulse 71, temperature 97.7 F (36.5 C), height 5\' 2"  (1.575 m), weight 179 lb (81.2 kg), SpO2 96 %. Body mass index is 32.74 kg/m.  General: Cooperative, alert, well developed, in no acute distress. HEENT: Conjunctivae and lids unremarkable. Cardiovascular: Regular rhythm.  Lungs: Normal work of breathing. Neurologic: No focal deficits.   Lab Results  Component Value Date   CREATININE 0.89 03/09/2021   BUN 12 03/09/2021   NA 142 03/09/2021   K 4.3 03/09/2021   CL 103 03/09/2021   CO2 23 03/09/2021   Lab Results  Component Value Date   ALT 29 03/09/2021   AST 22 03/09/2021   ALKPHOS 69 03/09/2021   BILITOT 0.5 03/09/2021   Lab Results  Component Value Date  HGBA1C 5.2 03/09/2021   HGBA1C 5.5 06/09/2020   HGBA1C 5.5 02/25/2019   Lab Results  Component Value Date   INSULIN 23.3 03/09/2021   INSULIN 50.6 (H) 12/18/2020   INSULIN 21.1 06/09/2020   Lab Results  Component Value Date   TSH 1.610 06/09/2020   Lab Results  Component Value Date   CHOL 180 03/09/2021   HDL 44 03/09/2021   LDLCALC 112 (H) 03/09/2021   TRIG 134 03/09/2021   CHOLHDL 7.0 (H) 06/09/2020   Lab Results  Component Value Date   VD25OH 54.0 03/09/2021   VD25OH 53.2 12/18/2020   VD25OH 23.3 (L) 06/09/2020   Lab Results  Component Value Date   WBC 6.7 06/09/2020   HGB 14.8 06/09/2020   HCT 42.7 06/09/2020   MCV 90 06/09/2020   PLT 225 06/09/2020   No results found for: IRON, TIBC, FERRITIN  Attestation Statements:   Reviewed by clinician on day of visit: allergies,  medications, problem list, medical history, surgical history, family history, social history, and previous encounter notes.   Wilhemena Durie, am acting as transcriptionist for [employer], DO.  I have reviewed the above documentation for accuracy and completeness, and I agree with the above. Marjory Sneddon, D.O.  The [place] was signed into law in 2016 which includes the topic of electronic health records.  This provides immediate access to information in MyChart.  This includes consultation notes, operative notes, office notes, lab results and pathology reports.  If you have any questions about what you read please let us know at your next visit so we can discuss your concerns and take corrective action if need be.  We are right here with you.

## 2021-06-17 ENCOUNTER — Ambulatory Visit (INDEPENDENT_AMBULATORY_CARE_PROVIDER_SITE_OTHER): Payer: Managed Care, Other (non HMO) | Admitting: Family Medicine

## 2021-06-17 ENCOUNTER — Other Ambulatory Visit: Payer: Self-pay

## 2021-06-17 ENCOUNTER — Encounter (INDEPENDENT_AMBULATORY_CARE_PROVIDER_SITE_OTHER): Payer: Self-pay | Admitting: Family Medicine

## 2021-06-17 VITALS — BP 137/83 | HR 65 | Temp 98.0°F | Ht 62.0 in | Wt 179.0 lb

## 2021-06-17 DIAGNOSIS — Z6832 Body mass index (BMI) 32.0-32.9, adult: Secondary | ICD-10-CM

## 2021-06-17 DIAGNOSIS — I10 Essential (primary) hypertension: Secondary | ICD-10-CM

## 2021-06-17 DIAGNOSIS — E7849 Other hyperlipidemia: Secondary | ICD-10-CM

## 2021-06-17 DIAGNOSIS — E559 Vitamin D deficiency, unspecified: Secondary | ICD-10-CM | POA: Diagnosis not present

## 2021-06-17 DIAGNOSIS — E8881 Metabolic syndrome: Secondary | ICD-10-CM | POA: Diagnosis not present

## 2021-06-17 DIAGNOSIS — Z9189 Other specified personal risk factors, not elsewhere classified: Secondary | ICD-10-CM

## 2021-06-17 DIAGNOSIS — E669 Obesity, unspecified: Secondary | ICD-10-CM

## 2021-06-17 MED ORDER — METFORMIN HCL 500 MG PO TABS: ORAL_TABLET | ORAL | 0 refills | Status: DC

## 2021-06-17 MED ORDER — VITAMIN D (ERGOCALCIFEROL) 1.25 MG (50000 UNIT) PO CAPS: 50000.0000 [IU] | ORAL_CAPSULE | ORAL | 0 refills | Status: DC

## 2021-06-17 MED ORDER — ROSUVASTATIN CALCIUM 10 MG PO TABS: 10.0000 mg | ORAL_TABLET | Freq: Every day | ORAL | 0 refills | Status: DC

## 2021-06-21 NOTE — Progress Notes (Signed)
Chief Complaint:   OBESITY Andrea Jones is here to discuss her progress with her obesity treatment plan along with follow-up of her obesity related diagnoses. 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 Andrea Jones states she is walking for 30 minutes 3 times per week.  Today's visit was #: 18 Starting weight: 183 lbs Starting date: 06/19/2020 Today's weight: 179 lbs Today's date: 06/17/2021 Total lbs lost to date: 4 Total lbs lost since last in-office visit: 0  Interim History: [place] and she [place] She notes that she felt really good about it. Her husband is not doing well health wise, which is causing her distress.  Subjective:   1. Insulin resistance Andrea Jones was started on metformin at her last office visit. She notes it is helping with sweet cravings. She has less hunger and is doing less snacking.  2. Other hyperlipidemia Andrea Jones is tolerating medication(s) well without side effects.  Medication compliance is good and patient appears to be taking it as prescribed.  Denies additional concerns regarding this condition.   3. Essential hypertension Andrea Jones's blood pressure is at goal. Cardiovascular ROS: no chest pain or dyspnea on exertion.  BP Readings from Last 3 Encounters:  06/17/21 137/83  05/24/21 130/80  05/03/21 137/77   4. Vitamin D deficiency Andrea Jones is currently taking prescription vitamin D 50,000 IU each week. She denies nausea, vomiting or muscle weakness.  5. At risk for heart disease Andrea Jones is at a higher than average risk for cardiovascular disease due to obesity, hyperlipidemia, and hypertension.   Assessment/Plan:  No orders of the defined types were placed in this encounter.   Medications Discontinued  During This Encounter  Medication Reason   rosuvastatin (CRESTOR) 10 MG tablet Reorder   Vitamin D, Ergocalciferol, (DRISDOL) 1.25 MG (50000 UNIT) CAPS capsule Reorder   metFORMIN (GLUCOPHAGE) 500 MG tablet Reorder     Meds ordered this encounter  Medications   metFORMIN (GLUCOPHAGE) 500 MG tablet    Sig: 500mg  po daily with lunch    Dispense:  30 tablet    Refill:  0    30 d supply;  ** OV for RF **   Do not send RF request   rosuvastatin (CRESTOR) 10 MG tablet    Sig: Take 1 tablet (10 mg total) by mouth at bedtime.    Dispense:  30 tablet    Refill:  0    30 d supply;  ** OV for RF **   Do not send RF request   Vitamin D, Ergocalciferol, (DRISDOL) 1.25 MG (50000 UNIT) CAPS capsule    Sig: Take 1 capsule (50,000 Units total) by mouth every 7 (seven) days.    Dispense:  4 capsule    Refill:  0    Ov for RF     1. Insulin resistance We will refill metformin for 1 month. Andrea Jones will continue to work on weight loss, exercise, and decreasing simple carbohydrates to help decrease the risk of diabetes. Andrea Jones agreed to follow-up with [name] as directed to closely monitor her progress.  - metFORMIN (GLUCOPHAGE) 500 MG tablet; 500mg  po daily with lunch  Dispense: 30 tablet; Refill: 0  2. Other hyperlipidemia Cardiovascular risk and specific lipid/LDL goals reviewed. We discussed several lifestyle modifications today. We will  refill Crestor for 1 month. Andrea Jones will continue to work on diet, exercise and weight loss efforts. Orders and follow up as documented in patient record.   Counseling Intensive lifestyle modifications are the first line treatment for this issue. Dietary changes: Increase soluble fiber. Decrease simple carbohydrates. Exercise changes: Moderate to vigorous-intensity aerobic activity 150 minutes per week if tolerated. Lipid-lowering medications: see documented in medical record.  - rosuvastatin (CRESTOR) 10 MG tablet; Take 1 tablet (10 mg total) by mouth at bedtime.   Dispense: 30 tablet; Refill: 0  3. Essential hypertension Andrea Jones will continue Norvasc, and will continue working on healthy weight loss and exercise to improve blood pressure control. We will watch for signs of hypotension as she continues her lifestyle modifications.  4. Vitamin D deficiency Andrea Jones will continue prescription Vitamin D 50,000 IU every week, and we will refill for 1 month. She will follow-up for routine testing of Vitamin D, at least 2-3 times per year to avoid over-replacement.  - Vitamin D, Ergocalciferol, (DRISDOL) 1.25 MG (50000 UNIT) CAPS capsule; Take 1 capsule (50,000 Units total) by mouth every 7 (seven) days.  Dispense: 4 capsule; Refill: 0  5. At risk for heart disease Andrea Jones was given approximately 9 minutes of coronary artery disease prevention counseling today. She is 61 y.o. female and has risk factors for heart disease including obesity. We discussed intensive lifestyle modifications today with an emphasis on specific weight loss instructions and strategies.   Repetitive spaced learning was employed today to elicit superior memory formation and behavioral change.  6. Obesity with current BMI of 32.9 Andrea Jones is currently in the action stage of change. As such, her goal is to continue with weight loss efforts. She has agreed to practicing portion control and making smarter food choices, such as increasing vegetables and decreasing simple carbohydrates.   Andrea Jones is to come fasting for labs to her next office visit.  Exercise goals: As is, increase walking to 5 days per week.  Behavioral modification strategies: meal planning and cooking strategies and avoiding temptations.  Andrea Jones has agreed to follow-up with our clinic in 3 to 4 weeks. She was informed of the importance of frequent follow-up visits to maximize her success with intensive lifestyle modifications for her multiple health conditions.   Objective:   Blood pressure 137/83, pulse 65, temperature 98 F  (36.7 C), height 5\' 2"  (1.575 m), weight 179 lb (81.2 kg), SpO2 97 %. Body mass index is 32.74 kg/m.  General: Cooperative, alert, well developed, in no acute distress. HEENT: Conjunctivae and lids unremarkable. Cardiovascular: Regular rhythm.  Lungs: Normal work of breathing. Neurologic: No focal deficits.   Lab Results  Component Value Date   CREATININE 0.89 03/09/2021   BUN 12 03/09/2021   NA 142 03/09/2021   K 4.3 03/09/2021   CL 103 03/09/2021   CO2 23 03/09/2021   Lab Results  Component Value Date   ALT 29 03/09/2021   AST 22 03/09/2021   ALKPHOS 69 03/09/2021   BILITOT 0.5 03/09/2021   Lab Results  Component Value Date   HGBA1C 5.2 03/09/2021   HGBA1C 5.5 06/09/2020   HGBA1C 5.5 02/25/2019   Lab Results  Component Value Date   INSULIN 23.3 03/09/2021   INSULIN 50.6 (H) 12/18/2020   INSULIN 21.1 06/09/2020   Lab Results  Component Value Date   TSH 1.610 06/09/2020   Lab Results  Component Value Date   CHOL 180 03/09/2021   HDL 44 03/09/2021   LDLCALC 112 (H) 03/09/2021  TRIG 134 03/09/2021   CHOLHDL 7.0 (H) 06/09/2020   Lab Results  Component Value Date   VD25OH 54.0 03/09/2021   VD25OH 53.2 12/18/2020   VD25OH 23.3 (L) 06/09/2020   Lab Results  Component Value Date   WBC 6.7 06/09/2020   HGB 14.8 06/09/2020   HCT 42.7 06/09/2020   MCV 90 06/09/2020   PLT 225 06/09/2020   No results found for: IRON, TIBC, FERRITIN  Attestation Statements:   Reviewed by clinician on day of visit: allergies, medications, problem list, medical history, surgical history, family history, social history, and previous encounter notes.   Wilhemena Durie, am acting as transcriptionist for [employer], DO.  I have reviewed the above documentation for accuracy and completeness, and I agree with the above. Marjory Sneddon, D.O.  The [place] was signed into law in 2016 which includes the topic of electronic health records.  This provides  immediate access to information in MyChart.  This includes consultation notes, operative notes, office notes, lab results and pathology reports.  If you have any questions about what you read please let us know at your next visit so we can discuss your concerns and take corrective action if need be.  We are right here with you.

## 2021-07-08 ENCOUNTER — Ambulatory Visit (INDEPENDENT_AMBULATORY_CARE_PROVIDER_SITE_OTHER): Payer: Managed Care, Other (non HMO) | Admitting: Family Medicine

## 2021-07-08 ENCOUNTER — Encounter (INDEPENDENT_AMBULATORY_CARE_PROVIDER_SITE_OTHER): Payer: Self-pay | Admitting: Family Medicine

## 2021-07-08 ENCOUNTER — Other Ambulatory Visit: Payer: Self-pay

## 2021-07-08 VITALS — BP 131/74 | HR 78 | Temp 98.3°F | Ht 62.0 in | Wt 179.0 lb

## 2021-07-08 DIAGNOSIS — Z9189 Other specified personal risk factors, not elsewhere classified: Secondary | ICD-10-CM

## 2021-07-08 DIAGNOSIS — I10 Essential (primary) hypertension: Secondary | ICD-10-CM

## 2021-07-08 DIAGNOSIS — E559 Vitamin D deficiency, unspecified: Secondary | ICD-10-CM | POA: Diagnosis not present

## 2021-07-08 DIAGNOSIS — Z6833 Body mass index (BMI) 33.0-33.9, adult: Secondary | ICD-10-CM

## 2021-07-08 DIAGNOSIS — E7849 Other hyperlipidemia: Secondary | ICD-10-CM

## 2021-07-08 DIAGNOSIS — Z6832 Body mass index (BMI) 32.0-32.9, adult: Secondary | ICD-10-CM

## 2021-07-08 DIAGNOSIS — E669 Obesity, unspecified: Secondary | ICD-10-CM

## 2021-07-08 DIAGNOSIS — R5383 Other fatigue: Secondary | ICD-10-CM

## 2021-07-08 DIAGNOSIS — E8881 Metabolic syndrome: Secondary | ICD-10-CM

## 2021-07-08 MED ORDER — METFORMIN HCL 500 MG PO TABS: ORAL_TABLET | ORAL | 0 refills | Status: DC

## 2021-07-08 MED ORDER — ROSUVASTATIN CALCIUM 10 MG PO TABS: 10.0000 mg | ORAL_TABLET | Freq: Every day | ORAL | 0 refills | Status: DC

## 2021-07-08 MED ORDER — VITAMIN D (ERGOCALCIFEROL) 1.25 MG (50000 UNIT) PO CAPS: 50000.0000 [IU] | ORAL_CAPSULE | ORAL | 0 refills | Status: DC

## 2021-07-08 NOTE — Progress Notes (Signed)
Chief Complaint:   OBESITY Andrea Jones is here to discuss her progress with her obesity treatment plan along with follow-up of her obesity related diagnoses. Andrea Jones is on practicing portion control and making smarter food choices, such as increasing vegetables and decreasing simple carbohydrates and states she is following her eating plan approximately 50-60% of the time. Andrea Jones states she is walking 30 minutes 2 times per week.  Today's visit was #: 25 Starting weight: 183 lbs Starting date: 06/19/2020 Today's weight: 179 lbs Today's date: 07/08/2021 Total lbs lost to date: 4 Total lbs lost since last in-office visit: 0  Interim History: Pt's husband had cardiac stents placed recently.  Pt has had a lot of stress the past month and a half with husband.  Pt has maintained her weight the whole time. Pt wants to journal again and felt that helped her stay on plan the best.   Subjective:   1. Insulin resistance Pt is tolerating Metformin well with less cravings. No need for changes.  2. Other hyperlipidemia Crestor dose was increased early November. Andrea Jones is tolerating medication(s) well without side effects.  Medication compliance is good and patient appears to be taking it as prescribed.  Denies additional concerns regarding this condition.    3. Essential hypertension At goal.  Pt Asx.  Medications: Norvasc.   4. Vitamin D deficiency She is currently taking prescription vitamin D 50,000 IU each week. She denies nausea, vomiting or muscle weakness.  5. Other fatigue Pt has been feeling a little more tired than usual, likely due to more stress with her husband. She would like to check labs.  6. At risk for deficient intake of food The patient is at a higher than average risk of deficient intake of food due to past habits when journaling.   Assessment/Plan:   Orders Placed This Encounter  Procedures   VITAMIN D 25 Hydroxy (Vit-D Deficiency, Fractures)   Hemoglobin  A1c   Insulin, random   Lipid panel   Comprehensive metabolic panel   Magnesium   Vitamin B12   CBC with Differential/Platelet   TSH    Medications Discontinued During This Encounter  Medication Reason   metFORMIN (GLUCOPHAGE) 500 MG tablet Reorder   rosuvastatin (CRESTOR) 10 MG tablet Reorder   Vitamin D, Ergocalciferol, (DRISDOL) 1.25 MG (50000 UNIT) CAPS capsule Reorder     Meds ordered this encounter  Medications   Vitamin D, Ergocalciferol, (DRISDOL) 1.25 MG (50000 UNIT) CAPS capsule    Sig: Take 1 capsule (50,000 Units total) by mouth every 7 (seven) days.    Dispense:  4 capsule    Refill:  0    Ov for RF   rosuvastatin (CRESTOR) 10 MG tablet    Sig: Take 1 tablet (10 mg total) by mouth at bedtime.    Dispense:  30 tablet    Refill:  0    30 d supply;  ** OV for RF **   Do not send RF request   metFORMIN (GLUCOPHAGE) 500 MG tablet    Sig: 500mg  po daily with lunch    Dispense:  30 tablet    Refill:  0    30 d supply;  ** OV for RF **   Do not send RF request     1. Insulin resistance Andrea Jones will continue to work on weight loss, exercise, and decreasing simple carbohydrates to help decrease the risk of diabetes. Andrea Jones agreed to follow-up with [name] as directed to closely monitor  her progress. Check labs today.  Refill- metFORMIN (GLUCOPHAGE) 500 MG tablet; 500mg  po daily with lunch  Dispense: 30 tablet; Refill: 0  - Hemoglobin A1c - Insulin, random - Magnesium (due to being on Metformin)   2. Other hyperlipidemia Cardiovascular risk and specific lipid/LDL goals reviewed.  We discussed several lifestyle modifications today and Andrea Jones will continue to work on diet, exercise and weight loss efforts. Orders and follow up as documented in patient record. Check labs today.  Counseling Intensive lifestyle modifications are the first line treatment for this issue. Dietary changes: Increase soluble fiber. Decrease simple carbohydrates. Exercise changes: Moderate to  vigorous-intensity aerobic activity 150 minutes per week if tolerated. Lipid-lowering medications: see documented in medical record.  Refill- rosuvastatin (CRESTOR) 10 MG tablet; Take 1 tablet (10 mg total) by mouth at bedtime.  Dispense: 30 tablet; Refill: 0  - Lipid panel - Comprehensive metabolic panel  3. Essential hypertension Plan: Avoid buying foods that are: processed, frozen, or prepackaged to avoid excess salt. We will watch for signs of hypotension as she continues lifestyle modifications.  4. Vitamin D deficiency Low Vitamin D level contributes to fatigue and are associated with obesity, breast, and colon cancer. She agrees to continue to take prescription Vitamin D 50,000 IU every week and will follow-up for routine testing of Vitamin D, at least 2-3 times per year to avoid over-replacement. Check labs today.  Refill- Vitamin D, Ergocalciferol, (DRISDOL) 1.25 MG (50000 UNIT) CAPS capsule; Take 1 capsule (50,000 Units total) by mouth every 7 (seven) days.  Dispense: 4 capsule; Refill: 0  - VITAMIN D 25 Hydroxy (Vit-D Deficiency, Fractures)  5. Other fatigue Check labs today. Sleep hygiene and stress management discussed with pt.  - Vitamin B12 - CBC with Differential/Platelet - TSH  6. At risk for deficient intake of food Andrea Jones was given extensive education and counseling today of more than 9 minutes on risks associated with deficient food intake.  Counseled her on the importance of following our prescribed meal plan and eating adequate amounts of protein.  Discussed with Andrea Jones that inadequate food intake over longer periods of time can slow their metabolism down significantly.   7. Obesity with current BMI of 32.8 Andrea Jones is currently in the action stage of change. As such, her goal is to continue with weight loss efforts. She has agreed to keeping a food journal and adhering to recommended goals of 1300-1400 calories and 100+ grams protein.   Bring  journaling log to next OV.  Exercise goals:  As is  Behavioral modification strategies: planning for success and keeping a strict food journal.  Andrea Jones has agreed to follow-up with our clinic in 3 weeks. She was informed of the importance of frequent follow-up visits to maximize her success with intensive lifestyle modifications for her multiple health conditions.   Objective:   Blood pressure 131/74, pulse 78, temperature 98.3 F (36.8 C), height 5\' 2"  (1.575 m), weight 179 lb (81.2 kg), SpO2 96 %. Body mass index is 32.74 kg/m.  General: Cooperative, alert, well developed, in no acute distress. HEENT: Conjunctivae and lids unremarkable. Cardiovascular: Regular rhythm.  Lungs: Normal work of breathing. Neurologic: No focal deficits.   Lab Results  Component Value Date   CREATININE 0.89 03/09/2021   BUN 12 03/09/2021   NA 142 03/09/2021   K 4.3 03/09/2021   CL 103 03/09/2021   CO2 23 03/09/2021   Lab Results  Component Value Date   ALT 29 03/09/2021   AST 22  03/09/2021   ALKPHOS 69 03/09/2021   BILITOT 0.5 03/09/2021   Lab Results  Component Value Date   HGBA1C 5.2 03/09/2021   HGBA1C 5.5 06/09/2020   HGBA1C 5.5 02/25/2019   Lab Results  Component Value Date   INSULIN 23.3 03/09/2021   INSULIN 50.6 (H) 12/18/2020   INSULIN 21.1 06/09/2020   Lab Results  Component Value Date   TSH 1.610 06/09/2020   Lab Results  Component Value Date   CHOL 180 03/09/2021   HDL 44 03/09/2021   LDLCALC 112 (H) 03/09/2021   TRIG 134 03/09/2021   CHOLHDL 7.0 (H) 06/09/2020   Lab Results  Component Value Date   VD25OH 54.0 03/09/2021   VD25OH 53.2 12/18/2020   VD25OH 23.3 (L) 06/09/2020   Lab Results  Component Value Date   WBC 6.7 06/09/2020   HGB 14.8 06/09/2020   HCT 42.7 06/09/2020   MCV 90 06/09/2020   PLT 225 06/09/2020    Attestation Statements:   Reviewed by clinician on day of visit: allergies, medications, problem list, medical history, surgical  history, family history, social history, and previous encounter notes.  Coral Ceo, CMA, am acting as transcriptionist for [employer], DO.  I have reviewed the above documentation for accuracy and completeness, and I agree with the above. Andrea Jones, D.O.  The [place] was signed into law in 2016 which includes the topic of electronic health records.  This provides immediate access to information in MyChart.  This includes consultation notes, operative notes, office notes, lab results and pathology reports.  If you have any questions about what you read please let us know at your next visit so we can discuss your concerns and take corrective action if need be.  We are right here with you.

## 2021-07-09 LAB — CBC WITH DIFFERENTIAL/PLATELET
Basophils Absolute: 0 10*3/uL (ref 0.0–0.2)
Basos: 1 %
EOS (ABSOLUTE): 0.3 10*3/uL (ref 0.0–0.4)
Eos: 5 %
Hematocrit: 42.2 % (ref 34.0–46.6)
Hemoglobin: 14.4 g/dL (ref 11.1–15.9)
Immature Grans (Abs): 0 10*3/uL (ref 0.0–0.1)
Immature Granulocytes: 0 %
Lymphocytes Absolute: 1.9 10*3/uL (ref 0.7–3.1)
Lymphs: 30 %
MCH: 30.1 pg (ref 26.6–33.0)
MCHC: 34.1 g/dL (ref 31.5–35.7)
MCV: 88 fL (ref 79–97)
Monocytes Absolute: 0.6 10*3/uL (ref 0.1–0.9)
Monocytes: 9 %
Neutrophils Absolute: 3.5 10*3/uL (ref 1.4–7.0)
Neutrophils: 55 %
Platelets: 231 10*3/uL (ref 150–450)
RBC: 4.78 x10E6/uL (ref 3.77–5.28)
RDW: 12.7 % (ref 11.7–15.4)
WBC: 6.4 10*3/uL (ref 3.4–10.8)

## 2021-07-09 LAB — COMPREHENSIVE METABOLIC PANEL WITH GFR
ALT: 43 [IU]/L — ABNORMAL HIGH (ref 0–32)
AST: 32 [IU]/L (ref 0–40)
Albumin/Globulin Ratio: 1.7 (ref 1.2–2.2)
Albumin: 4.7 g/dL (ref 3.8–4.9)
Alkaline Phosphatase: 64 [IU]/L (ref 44–121)
BUN/Creatinine Ratio: 15 (ref 12–28)
BUN: 13 mg/dL (ref 8–27)
Bilirubin Total: 0.3 mg/dL (ref 0.0–1.2)
CO2: 24 mmol/L (ref 20–29)
Calcium: 9.7 mg/dL (ref 8.7–10.3)
Chloride: 105 mmol/L (ref 96–106)
Creatinine, Ser: 0.84 mg/dL (ref 0.57–1.00)
Globulin, Total: 2.7 g/dL (ref 1.5–4.5)
Glucose: 97 mg/dL (ref 70–99)
Potassium: 4.6 mmol/L (ref 3.5–5.2)
Sodium: 143 mmol/L (ref 134–144)
Total Protein: 7.4 g/dL (ref 6.0–8.5)
eGFR: 80 mL/min/{1.73_m2}

## 2021-07-09 LAB — INSULIN, RANDOM: INSULIN: 51.7 u[IU]/mL — ABNORMAL HIGH (ref 2.6–24.9)

## 2021-07-09 LAB — VITAMIN D 25 HYDROXY (VIT D DEFICIENCY, FRACTURES): Vit D, 25-Hydroxy: 55.4 ng/mL (ref 30.0–100.0)

## 2021-07-09 LAB — LIPID PANEL
Chol/HDL Ratio: 3.8 ratio (ref 0.0–4.4)
Cholesterol, Total: 170 mg/dL (ref 100–199)
HDL: 45 mg/dL
LDL Chol Calc (NIH): 109 mg/dL — ABNORMAL HIGH (ref 0–99)
Triglycerides: 88 mg/dL (ref 0–149)
VLDL Cholesterol Cal: 16 mg/dL (ref 5–40)

## 2021-07-09 LAB — HEMOGLOBIN A1C
Est. average glucose Bld gHb Est-mCnc: 108 mg/dL
Hgb A1c MFr Bld: 5.4 % (ref 4.8–5.6)

## 2021-07-09 LAB — VITAMIN B12: Vitamin B-12: >2000 pg/mL — ABNORMAL HIGH (ref 232–1245)

## 2021-07-09 LAB — TSH: TSH: 2.31 u[IU]/mL (ref 0.450–4.500)

## 2021-07-09 LAB — MAGNESIUM: Magnesium: 2.4 mg/dL — ABNORMAL HIGH (ref 1.6–2.3)

## 2021-07-29 ENCOUNTER — Ambulatory Visit (INDEPENDENT_AMBULATORY_CARE_PROVIDER_SITE_OTHER): Payer: Managed Care, Other (non HMO) | Admitting: Family Medicine

## 2021-08-05 ENCOUNTER — Other Ambulatory Visit (INDEPENDENT_AMBULATORY_CARE_PROVIDER_SITE_OTHER): Payer: Self-pay | Admitting: Family Medicine

## 2021-08-05 ENCOUNTER — Other Ambulatory Visit: Payer: Self-pay

## 2021-08-05 ENCOUNTER — Ambulatory Visit (INDEPENDENT_AMBULATORY_CARE_PROVIDER_SITE_OTHER): Payer: Managed Care, Other (non HMO) | Admitting: Family Medicine

## 2021-08-05 ENCOUNTER — Encounter (INDEPENDENT_AMBULATORY_CARE_PROVIDER_SITE_OTHER): Payer: Self-pay | Admitting: Family Medicine

## 2021-08-05 VITALS — BP 120/74 | HR 78 | Temp 98.2°F | Ht 62.0 in | Wt 183.0 lb

## 2021-08-05 DIAGNOSIS — Z9189 Other specified personal risk factors, not elsewhere classified: Secondary | ICD-10-CM | POA: Diagnosis not present

## 2021-08-05 DIAGNOSIS — E7849 Other hyperlipidemia: Secondary | ICD-10-CM | POA: Diagnosis not present

## 2021-08-05 DIAGNOSIS — I10 Essential (primary) hypertension: Secondary | ICD-10-CM | POA: Diagnosis not present

## 2021-08-05 DIAGNOSIS — E8881 Metabolic syndrome: Secondary | ICD-10-CM

## 2021-08-05 DIAGNOSIS — E559 Vitamin D deficiency, unspecified: Secondary | ICD-10-CM

## 2021-08-05 DIAGNOSIS — K760 Fatty (change of) liver, not elsewhere classified: Secondary | ICD-10-CM | POA: Diagnosis not present

## 2021-08-05 DIAGNOSIS — E669 Obesity, unspecified: Secondary | ICD-10-CM

## 2021-08-05 DIAGNOSIS — Z6833 Body mass index (BMI) 33.0-33.9, adult: Secondary | ICD-10-CM

## 2021-08-05 MED ORDER — METFORMIN HCL 500 MG PO TABS: ORAL_TABLET | ORAL | 0 refills | Status: DC

## 2021-08-05 MED ORDER — VITAMIN D (ERGOCALCIFEROL) 1.25 MG (50000 UNIT) PO CAPS: 50000.0000 [IU] | ORAL_CAPSULE | ORAL | 0 refills | Status: DC

## 2021-08-05 MED ORDER — ROSUVASTATIN CALCIUM 10 MG PO TABS: 10.0000 mg | ORAL_TABLET | Freq: Every day | ORAL | 0 refills | Status: DC

## 2021-08-09 NOTE — Progress Notes (Signed)
Chief Complaint:   OBESITY Andrea Jones is here to discuss her progress with her obesity treatment plan along with follow-up of her obesity related diagnoses. Andrea Jones is on keeping a food journal and adhering to recommended goals of 1300-1400 calories and 100+ grams of protein daily and states she is following her eating plan approximately 75% of the time. Andrea Jones states she is walking for 30 minutes 2 times per week.  Today's visit was #: 20 Starting weight: 183 lbs Starting date: 06/19/2020 Today's weight: 183 lbs Today's date: 08/05/2021 Total lbs lost to date: 0 Total lbs lost since last in-office visit: +4  Interim History: Andrea Jones had some girl [employer] cookies and she is not surprised that she gained weight. She hasn't walked much either. Her husband is going to cardiac rehab, and needs to get healthier.   -She is here to review labs that were recently drawn and asks me to discuss implications and meanings of those results from 07/08/21.    Subjective:   1. Insulin resistance Being on metformin for her IR, we discussed how this can make her deficient in B12 or magnesium.  Andrea Jones's B12 level is elevated at 2000; she drinks drinks with extra B Vitamins in them.  Her magnesium is N at 2.4.  A1c is 5.4, and fasting insulin is up from prior at 51.7.   I discussed labs with the patient today.   2. NAFLD (nonalcoholic fatty liver disease) Andrea Jones's ALT is worsening at 43 now, and was 29. I discussed labs with the patient today.  3. Other hyperlipidemia Andrea Jones's LDL is elevated at 109, HDL at 45, and triglycerides at 88. She is taking Crestor. I discussed labs with the patient today.  4. Essential hypertension Andrea Jones is taking Norvasc, and her blood pressure is at goal. Her creatinine is 0.84. I discussed labs with the patient today.  BP Readings from Last 3 Encounters:  08/05/21 120/74  07/08/21 131/74  06/17/21 137/83   5. Vitamin D deficiency Andrea Jones's Vitamin D level is 55.4, at goal.  She is taking Ergocalciferol.   6. At risk for impaired function of liver Andrea Jones is at increased risk for impaired liver function due to worsening LFTs.    Assessment/Plan:   Medications Discontinued During This Encounter  Medication Reason   CALCIUM PO Patient Preference   Cinnamon 500 MG capsule Patient Preference   FIBER PO Patient Preference   OVER THE COUNTER MEDICATION Patient Preference   Vitamin D, Ergocalciferol, (DRISDOL) 1.25 MG (50000 UNIT) CAPS capsule Reorder   rosuvastatin (CRESTOR) 10 MG tablet Reorder   metFORMIN (GLUCOPHAGE) 500 MG tablet Reorder     Meds ordered this encounter  Medications   Vitamin D, Ergocalciferol, (DRISDOL) 1.25 MG (50000 UNIT) CAPS capsule    Sig: Take 1 capsule (50,000 Units total) by mouth every 7 (seven) days.    Dispense:  4 capsule    Refill:  0    Ov for RF   metFORMIN (GLUCOPHAGE) 500 MG tablet    Sig: '500mg'$  po daily with lunch    Dispense:  30 tablet    Refill:  0    30 d supply;  ** OV for RF **   Do not send RF request   rosuvastatin (CRESTOR) 10 MG tablet    Sig: Take 1 tablet (10 mg total) by mouth at bedtime.    Dispense:  30 tablet    Refill:  0    30 d supply;  ** OV for RF **  Do not send RF request     1. Insulin resistance Worsening fasting insulin to over 50 now. Education was provided to the patient. Follow PNP and dec simple carbs.  Andrea Jones will continue metformin, and we will refill for 1 month.  - metFORMIN (GLUCOPHAGE) 500 MG tablet; '500mg'$  po daily with lunch  Dispense: 30 tablet; Refill: 0  2. NAFLD (nonalcoholic fatty liver disease) Disease counseling done.  Goal is to lose weight via a healthy diet to improve her liver fxn.  Andrea Jones will continue to decrease fatty carbohydrates, work on weight loss and following her prudent nutritional plan.  3. Other hyperlipidemia LDL and HDL improved.  Cardiovascular risk and specific lipid/LDL goals reviewed. We discussed several lifestyle modifications today. We  will refill Crestor for 1 month, with no change in dose. Andrea Jones will continue to work on diet, exercise and weight loss efforts. Orders and follow up as documented in patient record.   Counseling Intensive lifestyle modifications are the first line treatment for this issue. Dietary changes: Increase soluble fiber. Decrease simple carbohydrates. Exercise changes: Moderate to vigorous-intensity aerobic activity 150 minutes per week if tolerated. Lipid-lowering medications: see documented in medical record.  - rosuvastatin (CRESTOR) 10 MG tablet; Take 1 tablet (10 mg total) by mouth at bedtime.  Dispense: 30 tablet; Refill: 0  4. Essential hypertension Renal fxn WNL's.  Andrea Jones will continue Norvasc, and will continue with her prudent nutritional plan and weight loss. Will continue to monitor as she continues her lifestyle modifications.  5. Vitamin D deficiency At goal at 55.4.  We will refill prescription Vitamin D for 1 month. Andrea Jones will follow-up for routine testing of Vitamin D, at least 2-3 times per year to avoid over-replacement.  - Vitamin D, Ergocalciferol, (DRISDOL) 1.25 MG (50000 UNIT) CAPS capsule; Take 1 capsule (50,000 Units total) by mouth every 7 (seven) days.  Dispense: 4 capsule; Refill: 0  6. At risk for impaired function of liver Andrea Jones was given approximately 15 minutes of counseling today regarding prevention of impaired liver function. Andrea Jones was educated about her risk of developing NASH or even liver failure and advised that the only proven treatment for NAFLD was weight loss of at least 5-10% of body weight.   7. Obesity with current BMI of 33.5 Andrea Jones is currently in the action stage of change. As such, her goal is to continue with weight loss efforts. She has agreed to keeping a food journal and adhering to recommended goals of 1300-1400 calories and 100+ grams of protein daily.   Andrea Jones will bring in her journaling log to her next office visit because that helps her  tremendously to stay on track.  Exercise goals: For substantial health benefits, adults should do at least 150 minutes (2 hours and 30 minutes) a week of moderate-intensity, or 75 minutes (1 hour and 15 minutes) a week of vigorous-intensity aerobic physical activity, or an equivalent combination of moderate- and vigorous-intensity aerobic activity. Aerobic activity should be performed in episodes of at least 10 minutes, and preferably, it should be spread throughout the week. Increase as tolerated.  Behavioral modification strategies: increasing lean protein intake, decreasing simple carbohydrates, planning for success, and keeping a strict food journal.  Andrea Jones has agreed to follow-up with our clinic in 3 weeks. She was informed of the importance of frequent follow-up visits to maximize her success with intensive lifestyle modifications for her multiple health conditions.    Objective:   Blood pressure 120/74, pulse 78, temperature 98.2 F (36.8 C), height  $'5\' 2"'R$  (1.575 m), weight 183 lb (83 kg), SpO2 95 %. Body mass index is 33.47 kg/m.  General: Cooperative, alert, well developed, in no acute distress. HEENT: Conjunctivae and lids unremarkable. Cardiovascular: Regular rhythm.  Lungs: Normal work of breathing. Neurologic: No focal deficits.   Lab Results  Component Value Date   CREATININE 0.84 07/08/2021   BUN 13 07/08/2021   NA 143 07/08/2021   K 4.6 07/08/2021   CL 105 07/08/2021   CO2 24 07/08/2021   Lab Results  Component Value Date   ALT 43 (H) 07/08/2021   AST 32 07/08/2021   ALKPHOS 64 07/08/2021   BILITOT 0.3 07/08/2021   Lab Results  Component Value Date   HGBA1C 5.4 07/08/2021   HGBA1C 5.2 03/09/2021   HGBA1C 5.5 06/09/2020   HGBA1C 5.5 02/25/2019   Lab Results  Component Value Date   INSULIN 51.7 (H) 07/08/2021   INSULIN 23.3 03/09/2021   INSULIN 50.6 (H) 12/18/2020   INSULIN 21.1 06/09/2020   Lab Results  Component Value Date   TSH 2.310 07/08/2021    Lab Results  Component Value Date   CHOL 170 07/08/2021   HDL 45 07/08/2021   LDLCALC 109 (H) 07/08/2021   TRIG 88 07/08/2021   CHOLHDL 3.8 07/08/2021   Lab Results  Component Value Date   VD25OH 55.4 07/08/2021   VD25OH 54.0 03/09/2021   VD25OH 53.2 12/18/2020   Lab Results  Component Value Date   WBC 6.4 07/08/2021   HGB 14.4 07/08/2021   HCT 42.2 07/08/2021   MCV 88 07/08/2021   PLT 231 07/08/2021   No results found for: IRON, TIBC, FERRITIN  Attestation Statements:   Reviewed by clinician on day of visit: allergies, medications, problem list, medical history, surgical history, family history, social history, and previous encounter notes.   Wilhemena Durie, am acting as transcriptionist for [employer], DO.  I have reviewed the above documentation for accuracy and completeness, and I agree with the above. Marjory Sneddon, D.O.  The [place] was signed into law in 2016 which includes the topic of electronic health records.  This provides immediate access to information in MyChart.  This includes consultation notes, operative notes, office notes, lab results and pathology reports.  If you have any questions about what you read please let us know at your next visit so we can discuss your concerns and take corrective action if need be.  We are right here with you.

## 2021-08-25 ENCOUNTER — Ambulatory Visit: Payer: 59 | Admitting: Obstetrics & Gynecology

## 2021-08-26 ENCOUNTER — Encounter: Payer: Self-pay | Admitting: Obstetrics & Gynecology

## 2021-08-26 ENCOUNTER — Ambulatory Visit (INDEPENDENT_AMBULATORY_CARE_PROVIDER_SITE_OTHER): Payer: Managed Care, Other (non HMO) | Admitting: Obstetrics & Gynecology

## 2021-08-26 ENCOUNTER — Other Ambulatory Visit: Payer: Self-pay

## 2021-08-26 VITALS — BP 130/80 | Ht 62.0 in | Wt 186.0 lb

## 2021-08-26 DIAGNOSIS — Z1231 Encounter for screening mammogram for malignant neoplasm of breast: Secondary | ICD-10-CM | POA: Diagnosis not present

## 2021-08-26 DIAGNOSIS — N870 Mild cervical dysplasia: Secondary | ICD-10-CM

## 2021-08-26 DIAGNOSIS — Z1211 Encounter for screening for malignant neoplasm of colon: Secondary | ICD-10-CM

## 2021-08-26 DIAGNOSIS — Z01419 Encounter for gynecological examination (general) (routine) without abnormal findings: Secondary | ICD-10-CM | POA: Diagnosis not present

## 2021-08-26 NOTE — Progress Notes (Signed)
?HPI: ?     [name] is a 61 y.o. [phone] who LMP was in the past, she presents today for her annual examination.  The patient has no complaints today. Prior [facility], and has been having cervical dysplasia over the last few years, treated w CRYO last year.  No pain or bleeding.  No bladder sx's. ? ?The patient is sexually active. Herlast pap: approximate date 02/2021 and was abnormal: LGSIL; and last mammogram: approximate date 2022 and was abnormal: she has had biopsy by Dr Andrea Jones that was not cancer .  The patient does perform self breast exams.  There is notable family history of breast or ovarian cancer in her family. She tested 2022 NEG for the BRCA gene series. The patient is not taking hormone replacement therapy. Patient denies post-menopausal vaginal bleeding.   The patient has regular exercise: yes. The patient denies current symptoms of depression.   ? ?GYN Hx: ?Last Colonoscopy:10 years ago. Normal.  ?Last DEXA:  never  ago.   ? ?PMHx: ?Past Medical History:  ?Diagnosis Date  ? Anemia   ? Anxiety   ? Chest pain   ? Constipation   ? Family history of breast cancer   ? Family history of colon cancer   ? Family history of ovarian cancer   ? 8/19 and 9/21 cancer genetic testing letter sent  ? Family history of ovarian cancer   ? Heart murmur   ? hx of per pt, "hasn't presented itself in years"  ? Heartburn   ? High blood pressure   ? Hot flashes   ? "pretty much gone"  ? Hypercholesteremia   ? Hyperlipidemia   ? Insomnia   ? Joint pain   ? Liver problem   ? MGUS (monoclonal gammopathy of unknown significance)   ? sees Dr. Tasia Jones with hematology  ? MGUS (monoclonal gammopathy of unknown significance)   ? Nerve pain   ? in hands and feet  ? Palpitations   ? SOB (shortness of breath) on exertion   ? Unspecified lump in the left breast, unspecified quadrant   ? surgery scheduled for 07/15/20  ? Vitamin D deficiency 2016  ? ?Past Surgical History:  ?Procedure Laterality Date  ? ABDOMINAL HYSTERECTOMY  2001   ? APPENDECTOMY  1982  ? BREAST BIOPSY Left 1999  ? cyst,benign  ? BREAST BIOPSY Left 03/15/2019  ? [name] bx venus clip radial scar  ? BREAST LUMPECTOMY WITH RADIOACTIVE SEED LOCALIZATION Left 07/15/2020  ? Procedure: LEFT BREAST LUMPECTOMY WITH RADIOACTIVE SEED LOCALIZATION;  Surgeon: Andrea Kussmaul, MD;  Location: [facility];  Service: General;  Laterality: Left;  ? [place]  ? COLONOSCOPY  06/13/2012  ? [place]. Normal Exam.  ? HYSTERECTOMY ABDOMINAL WITH SALPINGECTOMY    ? mgus    ? OOPHORECTOMY    ? OVARY SURGERY  2000  ? ?Family History  ?Problem Relation Age of Onset  ? Colon cancer Father 68  ? Heart disease Father   ? Cancer Father   ? Alcoholism Father   ? Congestive Heart Failure Mother   ? Emphysema Mother   ? Hypertension Mother   ? Kidney failure Mother   ? Neuropathy Mother   ? Hyperlipidemia Mother   ? Depression Mother   ? Anxiety disorder Mother   ? Obesity Mother   ? Ovarian cancer Sister 42  ?     pat 1/2 sister  ? Breast cancer Cousin 70  ? Breast  cancer Cousin   ? ?Social History  ? ?Tobacco Use  ? Smoking status: Never  ? Smokeless tobacco: Never  ?Vaping Use  ? Vaping Use: Never used  ?Substance Use Topics  ? Alcohol use: No  ?  Alcohol/week: 0.0 standard drinks  ? Drug use: No  ? ? ?Current Outpatient Medications:  ?  amLODipine (NORVASC) 10 MG tablet, Take 1 tablet (10 mg total) by mouth daily., Disp: 30 tablet, Rfl: 11 ?  Biotin 10000 MCG TABS, Take 1 each by mouth daily., Disp: , Rfl:  ?  metFORMIN (GLUCOPHAGE) 500 MG tablet, $RemoveBe'500mg'[id number]$  po daily with lunch, Disp: 30 tablet, Rfl: 0 ?  Multiple Vitamins-Minerals (WOMENS MULTIVITAMIN PO), Take by mouth., Disp: , Rfl:  ?  Omega-3 Fatty Acids (OMEGA 3 PO), Take 1,200 mg by mouth daily., Disp: , Rfl:  ?  rosuvastatin (CRESTOR) 10 MG tablet, Take 1 tablet (10 mg total) by mouth at bedtime., Disp: 30 tablet, Rfl: 0 ?  Vitamin D, Ergocalciferol, (DRISDOL) 1.25 MG (50000 UNIT) CAPS capsule, Take 1 capsule (50,000 Units total) by  mouth every 7 (seven) days., Disp: 4 capsule, Rfl: 0 ?  vitamin E 1000 UNIT capsule, Take 1,000 Units by mouth daily., Disp: , Rfl:  ?Allergies: Patient has no known allergies. ? ?Review of Systems  ?Constitutional:  Negative for chills, fever and malaise/fatigue.  ?HENT:  Negative for congestion, sinus pain and sore throat.   ?Eyes:  Negative for blurred vision and pain.  ?Respiratory:  Negative for cough and wheezing.   ?Cardiovascular:  Negative for chest pain and leg swelling.  ?Gastrointestinal:  Negative for abdominal pain, constipation, diarrhea, heartburn, nausea and vomiting.  ?Genitourinary:  Negative for dysuria, frequency, hematuria and urgency.  ?Musculoskeletal:  Negative for back pain, joint pain, myalgias and neck pain.  ?Skin:  Negative for itching and rash.  ?Neurological:  Negative for dizziness, tremors and weakness.  ?Endo/Heme/Allergies:  Does not bruise/bleed easily.  ?Psychiatric/Behavioral:  Negative for depression. The patient is not nervous/anxious and does not have insomnia.   ? ?Objective: ?BP 130/80   Ht $R'5\' 2"'tg$  (1.575 m)   Wt 186 lb (84.4 kg)   BMI 34.02 kg/m?   ?Filed Weights  ? 08/26/21 0938  ?Weight: 186 lb (84.4 kg)  ? Body mass index is 34.02 kg/m?Marland Kitchen ?Physical Exam ?Constitutional:   ?   General: She is not in acute distress. ?   Appearance: She is well-developed.  ?Genitourinary:  ?   Bladder, rectum and urethral meatus normal.  ?   No lesions in the vagina.  ?   Right Labia: No rash, tenderness or lesions. ?   Left Labia: No tenderness, lesions or rash. ?   No vaginal bleeding.  ? ?   Right Adnexa: not tender and no mass present. ?   Left Adnexa: not tender and no mass present. ?   No cervical motion tenderness, friability, lesion or polyp.  ?   Cervical exam comments: Small, flush w vag apex.  ?   Uterus is absent.  ?   Pelvic exam was performed with patient in the lithotomy position.  ?Breasts: ?   Right: No mass, skin change or tenderness.  ?   Left: No mass, skin change or  tenderness.  ?HENT:  ?   Head: Normocephalic and atraumatic. No laceration.  ?   Right Ear: Hearing normal.  ?   Left Ear: Hearing normal.  ?   Mouth/Throat:  ?   Pharynx: Uvula midline.  ?Eyes:  ?  Pupils: Pupils are equal, round, and reactive to light.  ?Neck:  ?   Thyroid: No thyromegaly.  ?Cardiovascular:  ?   Rate and Rhythm: Normal rate and regular rhythm.  ?   Heart sounds: No murmur heard. ?  No friction rub. No gallop.  ?Pulmonary:  ?   Effort: Pulmonary effort is normal. No respiratory distress.  ?   Breath sounds: Normal breath sounds. No wheezing.  ?Abdominal:  ?   General: Bowel sounds are normal. There is no distension.  ?   Palpations: Abdomen is soft.  ?   Tenderness: There is no abdominal tenderness. There is no rebound.  ?Musculoskeletal:     ?   General: Normal range of motion.  ?   Cervical back: Normal range of motion and neck supple.  ?Neurological:  ?   Mental Status: She is alert and oriented to person, place, and time.  ?   Cranial Nerves: No cranial nerve deficit.  ?Skin: ?   General: Skin is warm and dry.  ?Psychiatric:     ?   Judgment: Judgment normal.  ?Vitals reviewed.  ? ? ?Assessment: Annual Exam ?1. Women's annual routine gynecological examination   ?2. CIN I (cervical intraepithelial neoplasia I)   ?3. Screen for colon cancer   ?4. Encounter for screening mammogram for malignant neoplasm of breast   ? ? ?Plan: ?           ?1.  Cervical Screening-  Pap smear done today, continue every 6 mos.  Options for cryo again vs trachelectomy discussed. ? ?2. Breast screening- Exam annually and mammogram scheduled ? ?3. Colonoscopy every 10 years, Hemoccult testing after age 75 ? ?102. Labs managed by PCP ? ?5. Counseling for hormonal therapy: none ? ?            6. FRAX - FRAX score for assessing the 10 year probability for fracture calculated and discussed today.  Based on age and score today, DEXA is not currently scheduled. ? ?  F/U ? Return for and Annual when due. ? ?Barnett Applebaum, MD,  [facility] ?[facility], [place] ?08/26/2021  10:12 AM ? ? ?

## 2021-08-26 NOTE — Patient Instructions (Signed)
This is the new practice information you requested: ? ?Dr. Clarisse [facility] ?[address] ?[address] ?[place], [place]  [address] ?[phone] ? ?PAP today, again in 6 months ?Mammogram every year ?   Call [phone] to schedule at [facility] ?Colonoscopy every 10 years, due soon ?Labs yearly (with PCP) ? ?Thank you for choosing [facility]. As part of our ongoing efforts to improve patient experience, we would appreciate your feedback. Please fill out the short survey that you will receive by mail or MyChart. Your opinion is important to [name]! ?- Dr. Kenton Kingfisher ? ?

## 2021-08-30 ENCOUNTER — Other Ambulatory Visit: Payer: Self-pay

## 2021-08-30 ENCOUNTER — Ambulatory Visit (INDEPENDENT_AMBULATORY_CARE_PROVIDER_SITE_OTHER): Payer: Managed Care, Other (non HMO) | Admitting: Family Medicine

## 2021-08-30 DIAGNOSIS — Z1211 Encounter for screening for malignant neoplasm of colon: Secondary | ICD-10-CM

## 2021-08-30 MED ORDER — NA SULFATE-K SULFATE-MG SULF 17.5-3.13-1.6 GM/177ML PO SOLN: 1.0000 | Freq: Once | ORAL | 0 refills | Status: AC

## 2021-08-30 NOTE — Progress Notes (Signed)
Gastroenterology Pre-Procedure Review ? ?Request Date: 10/26/2021 ?Requesting Physician: Dr. Vicente Males ? ?PATIENT REVIEW QUESTIONS: The patient responded to the following health history questions as indicated:   ? ?1. Are you having any GI issues? no ?2. Do you have a personal history of Polyps? no ?3. Do you have a family history of Colon Cancer or Polyps? yes (COLON CANCER) ?4. Diabetes Mellitus? no ?5. Joint replacements in the past 12 months?no ?6. Major health problems in the past 3 months?no ?7. Any artificial heart valves, MVP, or defibrillator?no ?   ?MEDICATIONS & ALLERGIES:    ?Patient reports the following regarding taking any anticoagulation/antiplatelet therapy:   ?Plavix, Coumadin, Eliquis, Xarelto, Lovenox, Pradaxa, Brilinta, or Effient? no ?Aspirin? no ? ?Patient confirms/reports the following medications:  ?Current Outpatient Medications  ?Medication Sig Dispense Refill  ? amLODipine (NORVASC) 10 MG tablet Take 1 tablet (10 mg total) by mouth daily. 30 tablet 11  ? Biotin 10000 MCG TABS Take 1 each by mouth daily.    ? metFORMIN (GLUCOPHAGE) 500 MG tablet '500mg'$  po daily with lunch 30 tablet 0  ? Multiple Vitamins-Minerals (WOMENS MULTIVITAMIN PO) Take by mouth.    ? Omega-3 Fatty Acids (OMEGA 3 PO) Take 1,200 mg by mouth daily.    ? rosuvastatin (CRESTOR) 10 MG tablet Take 1 tablet (10 mg total) by mouth at bedtime. 30 tablet 0  ? Vitamin D, Ergocalciferol, (DRISDOL) 1.25 MG (50000 UNIT) CAPS capsule Take 1 capsule (50,000 Units total) by mouth every 7 (seven) days. 4 capsule 0  ? vitamin E 1000 UNIT capsule Take 1,000 Units by mouth daily.    ? ?No current facility-administered medications for this visit.  ? ? ?Patient confirms/reports the following allergies:  ?No Known Allergies ? ?No orders of the defined types were placed in this encounter. ? ? ?AUTHORIZATION INFORMATION ?Primary Insurance: ?1D#: ?Group #: ? ?Secondary Insurance: ?1D#: ?Group #: ? ?SCHEDULE INFORMATION: ?Date:  10/26/2021 ?Time: ?[place] ?

## 2021-09-03 LAB — PAP IG (IMAGE GUIDED)

## 2021-09-20 ENCOUNTER — Encounter (INDEPENDENT_AMBULATORY_CARE_PROVIDER_SITE_OTHER): Payer: Self-pay | Admitting: Family Medicine

## 2021-09-20 ENCOUNTER — Telehealth (INDEPENDENT_AMBULATORY_CARE_PROVIDER_SITE_OTHER): Payer: Self-pay

## 2021-09-20 ENCOUNTER — Telehealth (INDEPENDENT_AMBULATORY_CARE_PROVIDER_SITE_OTHER): Payer: Managed Care, Other (non HMO) | Admitting: Family Medicine

## 2021-09-20 ENCOUNTER — Other Ambulatory Visit (INDEPENDENT_AMBULATORY_CARE_PROVIDER_SITE_OTHER): Payer: Self-pay | Admitting: Family Medicine

## 2021-09-20 DIAGNOSIS — E669 Obesity, unspecified: Secondary | ICD-10-CM | POA: Diagnosis not present

## 2021-09-20 DIAGNOSIS — E559 Vitamin D deficiency, unspecified: Secondary | ICD-10-CM

## 2021-09-20 DIAGNOSIS — J3089 Other allergic rhinitis: Secondary | ICD-10-CM | POA: Diagnosis not present

## 2021-09-20 DIAGNOSIS — Z6833 Body mass index (BMI) 33.0-33.9, adult: Secondary | ICD-10-CM

## 2021-09-20 DIAGNOSIS — E8881 Metabolic syndrome: Secondary | ICD-10-CM | POA: Diagnosis not present

## 2021-09-20 MED ORDER — METFORMIN HCL 500 MG PO TABS: ORAL_TABLET | ORAL | 0 refills | Status: DC

## 2021-09-20 MED ORDER — VITAMIN D (ERGOCALCIFEROL) 1.25 MG (50000 UNIT) PO CAPS: 50000.0000 [IU] | ORAL_CAPSULE | ORAL | 0 refills | Status: DC

## 2021-09-20 MED ORDER — LEVOCETIRIZINE DIHYDROCHLORIDE 5 MG PO TABS: 5.0000 mg | ORAL_TABLET | Freq: Every evening | ORAL | 0 refills | Status: DC

## 2021-09-20 NOTE — Telephone Encounter (Signed)
Called to reschedule appt, no answer. L/M to call and reschedule at her convenience. ?

## 2021-10-01 NOTE — Progress Notes (Signed)
? ? ?TeleHealth Visit:  ?Due to the COVID-19 pandemic, this visit was completed with telemedicine (audio/video) technology to reduce patient and provider exposure as well as to preserve personal protective equipment.  ? ?Andrea Jones has verbally consented to this TeleHealth visit. The patient is located at home, the provider is located at the [facility] office. The participants in this visit include the listed provider and patient. The visit was conducted today via MyChart video. ? ?Chief Complaint: OBESITY ?Andrea Jones is here to discuss her progress with her obesity treatment plan along with follow-up of her obesity related diagnoses. Andrea Jones is on keeping a food journal and adhering to recommended goals of 1300-1400 calories and 100+ grams of protein daily and states she is following her eating plan approximately 70% of the time. Andrea Jones states she is not currently exercising. ? ?Today's visit was #: 21 ?Starting weight: 183 lbs ?Starting date: 06/19/2020 ? ?Interim History: Andrea Jones started with URI symptoms 2-3 days ago. She notes head congestion, dry cough, and scratchy throat. Not usually with allergy symptoms this time of year. She felt feverish but none registered on the thermometer. She had a negative COVID test. She hasn't felt like eating much and not eating food on the plan when she does eat.  ? ?Subjective:  ? ?1. Insulin resistance ?Andrea Jones notes metformin has helped with hunger and cravings. She is tolerating it well with no GI upset.  ? ?2. Environmental and seasonal allergies ?Andrea Jones has URI symptoms. She has seasonal allergies that have been more bothersome with the change of weather/season. Andrea Jones takes OTC Allegra and Flonase as needed, but has not been taking them regularly. ?  ?3. Vitamin D deficiency ?Andrea Jones is currently taking prescription vitamin D 50,000 IU each week. She denies nausea, vomiting or muscle weakness. ? ?Assessment/Plan:  ?No orders of the defined types were placed in this  encounter. ? ? ?Medications Discontinued During This Encounter  ?Medication Reason  ? Vitamin D, Ergocalciferol, (DRISDOL) 1.25 MG (50000 UNIT) CAPS capsule Reorder  ? metFORMIN (GLUCOPHAGE) 500 MG tablet Reorder  ?  ? ?Meds ordered this encounter  ?Medications  ? metFORMIN (GLUCOPHAGE) 500 MG tablet  ?  Sig: '500mg'$  po daily with lunch  ?  Dispense:  30 tablet  ?  Refill:  0  ?  30 d supply;  ** OV for RF **   Do not send RF request  ? Vitamin D, Ergocalciferol, (DRISDOL) 1.25 MG (50000 UNIT) CAPS capsule  ?  Sig: Take 1 capsule (50,000 Units total) by mouth every 7 (seven) days.  ?  Dispense:  4 capsule  ?  Refill:  0  ?  Ov for RF  ? levocetirizine (XYZAL) 5 MG tablet  ?  Sig: Take 1 tablet (5 mg total) by mouth every evening.  ?  Dispense:  30 tablet  ?  Refill:  0  ?  ? ?1. Insulin resistance ?We will refill metformin for 1 month at the same dose (patient declines needs for change). She will continue her prudent nutritional plan and decrease simple carbohydrates. ? ?- metFORMIN (GLUCOPHAGE) 500 MG tablet; '500mg'$  po daily with lunch  Dispense: 30 tablet; Refill: 0 ? ?2. Environmental and seasonal allergies ?Andrea Jones agreed to start Xyzal 5 mg daily with no refills. Preventive strategies were discussed with the patient. She is ok to use OTC sinus rinses BID.  ? ?- levocetirizine (XYZAL) 5 MG tablet; Take 1 tablet (5 mg total) by mouth every evening.  Dispense: 30 tablet; Refill:  0 ? ?3. Vitamin D deficiency ?We will refill prescription Vitamin D for 1 month. Andrea Jones will follow-up for routine testing of Vitamin D, at least 2-3 times per year to avoid over-replacement. ? ?- Vitamin D, Ergocalciferol, (DRISDOL) 1.25 MG (50000 UNIT) CAPS capsule; Take 1 capsule (50,000 Units total) by mouth every 7 (seven) days.  Dispense: 4 capsule; Refill: 0 ? ?4. Obesity with current BMI of 33.5 ?Andrea Jones is currently in the action stage of change. As such, her goal is to continue with weight loss efforts. She has agreed to keeping a  food journal and adhering to recommended goals of 1300-1400 calories and 100+ grams of protein daily.  ? ?We discussed the importance of regular follow ups with her PCP for chronic diagnosis management. She will get her blood pressure and cholesterol medications from them in the future. ? ?Exercise goals: As is.  ? ?Behavioral modification strategies: increasing lean protein intake, decreasing simple carbohydrates, and no skipping meals. ? ?Andrea Jones has agreed to follow-up with our clinic in 3 to 4 weeks. She was informed of the importance of frequent follow-up visits to maximize her success with intensive lifestyle modifications for her multiple health conditions. ? ?Objective:  ? ?VITALS: Per patient if applicable, see vitals. ?GENERAL: Alert and in no acute distress. ?CARDIOPULMONARY: No increased WOB. Speaking in clear sentences.  ?PSYCH: Pleasant and cooperative. Speech normal rate and rhythm. Affect is appropriate. Insight and judgement are appropriate. Attention is focused, linear, and appropriate.  ?NEURO: Oriented as arrived to appointment on time with no prompting.  ? ?Lab Results  ?Component Value Date  ? CREATININE 0.84 07/08/2021  ? BUN 13 07/08/2021  ? NA 143 07/08/2021  ? K 4.6 07/08/2021  ? CL 105 07/08/2021  ? CO2 24 07/08/2021  ? ?Lab Results  ?Component Value Date  ? ALT 43 (H) 07/08/2021  ? AST 32 07/08/2021  ? ALKPHOS 64 07/08/2021  ? BILITOT 0.3 07/08/2021  ? ?Lab Results  ?Component Value Date  ? HGBA1C 5.4 07/08/2021  ? HGBA1C 5.2 03/09/2021  ? HGBA1C 5.5 06/09/2020  ? HGBA1C 5.5 02/25/2019  ? ?Lab Results  ?Component Value Date  ? INSULIN 51.7 (H) 07/08/2021  ? INSULIN 23.3 03/09/2021  ? INSULIN 50.6 (H) 12/18/2020  ? INSULIN 21.1 06/09/2020  ? ?Lab Results  ?Component Value Date  ? TSH 2.310 07/08/2021  ? ?Lab Results  ?Component Value Date  ? CHOL 170 07/08/2021  ? HDL 45 07/08/2021  ? LDLCALC 109 (H) 07/08/2021  ? TRIG 88 07/08/2021  ? CHOLHDL 3.8 07/08/2021  ? ?Lab Results  ?Component  Value Date  ? VD25OH 55.4 07/08/2021  ? VD25OH 54.0 03/09/2021  ? VD25OH 53.2 12/18/2020  ? ?Lab Results  ?Component Value Date  ? WBC 6.4 07/08/2021  ? HGB 14.4 07/08/2021  ? HCT 42.2 07/08/2021  ? MCV 88 07/08/2021  ? PLT 231 07/08/2021  ? ?No results found for: IRON, TIBC, FERRITIN ? ?Attestation Statements:  ? ?Reviewed by clinician on day of visit: allergies, medications, problem list, medical history, surgical history, family history, social history, and previous encounter notes. ? ? ?I, Trixie Dredge, am acting as transcriptionist for [employer], DO. ? ?I have reviewed the above documentation for accuracy and completeness, and I agree with the above. Marjory Sneddon, D.O. ? ?The [place] was signed into law in 2016 which includes the topic of electronic health records.  This provides immediate access to information in MyChart.  This includes consultation notes,  operative notes, office notes, lab results and pathology reports.  If you have any questions about what you read please let us know at your next visit so we can discuss your concerns and take corrective action if need be.  We are right here with you. ?  ?

## 2021-10-05 ENCOUNTER — Ambulatory Visit
Admission: RE | Admit: 2021-10-05 | Discharge: 2021-10-05 | Disposition: A | Discharge status: Home / Self Care | Payer: Managed Care, Other (non HMO) | Source: Ambulatory Visit | Attending: Obstetrics & Gynecology | Admitting: Obstetrics & Gynecology

## 2021-10-05 DIAGNOSIS — Z1231 Encounter for screening mammogram for malignant neoplasm of breast: Secondary | ICD-10-CM | POA: Insufficient documentation

## 2021-10-12 ENCOUNTER — Ambulatory Visit (INDEPENDENT_AMBULATORY_CARE_PROVIDER_SITE_OTHER): Payer: Managed Care, Other (non HMO) | Admitting: Family Medicine

## 2021-10-12 ENCOUNTER — Encounter (HOSPITAL_COMMUNITY): Payer: Self-pay

## 2021-10-12 ENCOUNTER — Encounter (INDEPENDENT_AMBULATORY_CARE_PROVIDER_SITE_OTHER): Payer: Self-pay | Admitting: Family Medicine

## 2021-10-12 VITALS — BP 113/73 | HR 69 | Temp 97.9°F | Ht 62.0 in | Wt 185.0 lb

## 2021-10-12 DIAGNOSIS — E669 Obesity, unspecified: Secondary | ICD-10-CM | POA: Diagnosis not present

## 2021-10-12 DIAGNOSIS — E8881 Metabolic syndrome: Secondary | ICD-10-CM

## 2021-10-12 DIAGNOSIS — Z6833 Body mass index (BMI) 33.0-33.9, adult: Secondary | ICD-10-CM

## 2021-10-12 DIAGNOSIS — E559 Vitamin D deficiency, unspecified: Secondary | ICD-10-CM | POA: Diagnosis not present

## 2021-10-12 DIAGNOSIS — J3089 Other allergic rhinitis: Secondary | ICD-10-CM

## 2021-10-12 DIAGNOSIS — E88819 Insulin resistance, unspecified: Secondary | ICD-10-CM

## 2021-10-12 DIAGNOSIS — Z9189 Other specified personal risk factors, not elsewhere classified: Secondary | ICD-10-CM

## 2021-10-12 DIAGNOSIS — E66811 Obesity, class 1: Secondary | ICD-10-CM

## 2021-10-12 MED ORDER — METFORMIN HCL 500 MG PO TABS: ORAL_TABLET | ORAL | 0 refills | Status: DC

## 2021-10-12 MED ORDER — LEVOCETIRIZINE DIHYDROCHLORIDE 5 MG PO TABS: 5.0000 mg | ORAL_TABLET | Freq: Every evening | ORAL | 0 refills | Status: DC

## 2021-10-12 MED ORDER — VITAMIN D (ERGOCALCIFEROL) 1.25 MG (50000 UNIT) PO CAPS: 50000.0000 [IU] | ORAL_CAPSULE | ORAL | 0 refills | Status: DC

## 2021-10-18 ENCOUNTER — Inpatient Hospital Stay: Payer: Managed Care, Other (non HMO) | Admitting: Oncology

## 2021-10-22 ENCOUNTER — Encounter: Payer: Self-pay | Admitting: Oncology

## 2021-10-24 NOTE — Progress Notes (Unsigned)
Chief Complaint:   OBESITY Andrea Jones is here to discuss her progress with her obesity treatment plan along with follow-up of her obesity related diagnoses. Andrea Jones is on keeping a food journal and adhering to recommended goals of 1500 calories and 100 protein and states she is following her eating plan approximately 60% of the time. Andrea Jones states she is walking 30 minutes 3 times per week.  Today's visit was #: 59 Starting weight: 183 lbs Starting date: 06/19/2020 Today's weight: 185 lb Today's date: 10/12/2021 Total lbs lost to date: 0 lbs Total lbs lost since last in-office visit: 0 lbs  Interim History: Andrea Jones's last office visit was via a video visit on 09/20/2021. She reports to have more stress in her life lately and is difficult to meal prep and plan; which she admits she is not doing lately.  Subjective:   1. Environmental and seasonal allergies Andrea Jones started taking Xyzal this pat office visit and it is helping some. She stopped taking Flonase, just forgot about it and it wan not helping with her symptoms.   2. Insulin resistance Andrea Jones is taking Metformin and it helps with cravings and hunger. Andrea Jones states that she realizes when she eats off plan she feels worse and more achy the next day.  3. Vitamin D deficiency She is currently taking prescription vitamin D 50,000 IU each week. She denies nausea, vomiting or muscle weakness.  4. At risk for malnutrition Andrea Jones is at increased risk for malnutrition due to current eating habits.  Assessment/Plan:  No orders of the defined types were placed in this encounter.   Medications Discontinued During This Encounter  Medication Reason   amLODipine (NORVASC) 10 MG tablet    Vitamin D, Ergocalciferol, (DRISDOL) 1.25 MG (50000 UNIT) CAPS capsule Reorder   levocetirizine (XYZAL) 5 MG tablet Reorder   metFORMIN (GLUCOPHAGE) 500 MG tablet    metFORMIN (GLUCOPHAGE) 500 MG tablet      Meds ordered this encounter  Medications    Vitamin D, Ergocalciferol, (DRISDOL) 1.25 MG (50000 UNIT) CAPS capsule    Sig: Take 1 capsule (50,000 Units total) by mouth every 7 (seven) days.    Dispense:  4 capsule    Refill:  0    Ov for RF   levocetirizine (XYZAL) 5 MG tablet    Sig: Take 1 tablet (5 mg total) by mouth every evening.    Dispense:  30 tablet    Refill:  0   metFORMIN (GLUCOPHAGE) 500 MG tablet    Sig: '500mg'$  po daily with lunch    Dispense:  30 tablet    Refill:  0    30 d supply;  ** OV for RF **   Do not send RF request     1. Environmental and seasonal allergies Andrea Jones will restart Flonase OTC, which she already has. We will refill Xyzal.  - levocetirizine (XYZAL) 5 MG tablet; Take 1 tablet (5 mg total) by mouth every evening.  Dispense: 30 tablet; Refill: 0  2. Insulin resistance We will refill Metformin 500 mg daily.  - metFORMIN (GLUCOPHAGE) 500 MG tablet; '500mg'$  po daily with lunch  Dispense: 30 tablet; Refill: 0  3. Vitamin D deficiency We will refill Vitamin D.  - Vitamin D, Ergocalciferol, (DRISDOL) 1.25 MG (50000 UNIT) CAPS capsule; Take 1 capsule (50,000 Units total) by mouth every 7 (seven) days.  Dispense: 4 capsule; Refill: 0  4. At risk for malnutrition Andrea Jones was given approximately 9 minutes of counseling today regarding prevention  of malnutrition and ways to meet macronutrient goals..    5. Obesity with current BMI of 33.8 Andrea Jones is currently in the action stage of change. As such, her goal is to continue with weight loss efforts. She has agreed to keeping a food journal and adhering to recommended goals of 1500 calories and 100 protein.   Andrea Jones is having an colonoscopy on 10/26/21 for screening and we discussed strategies for her to get calories in pre-colonoscopy.  Exercise goals: Andrea Jones is to work on 150 minutes of exercise weekly.  Behavioral modification strategies: meal planning and cooking strategies and planning for success.  Andrea Jones has agreed to follow-up with our clinic in  3-4 weeks. She was informed of the importance of frequent follow-up visits to maximize her success with intensive lifestyle modifications for her multiple health conditions.   Objective:   Blood pressure 113/73, pulse 69, temperature 97.9 F (36.6 C), temperature source Oral, height '5\' 2"'$  (1.575 m), weight 185 lb (83.9 kg), SpO2 95 %. Body mass index is 33.84 kg/m.  General: Cooperative, alert, well developed, in no acute distress. HEENT: Conjunctivae and lids unremarkable. Cardiovascular: Regular rhythm.  Lungs: Normal work of breathing. Neurologic: No focal deficits.   Lab Results  Component Value Date   CREATININE 0.84 07/08/2021   BUN 13 07/08/2021   NA 143 07/08/2021   K 4.6 07/08/2021   CL 105 07/08/2021   CO2 24 07/08/2021   Lab Results  Component Value Date   ALT 43 (H) 07/08/2021   AST 32 07/08/2021   ALKPHOS 64 07/08/2021   BILITOT 0.3 07/08/2021   Lab Results  Component Value Date   HGBA1C 5.4 07/08/2021   HGBA1C 5.2 03/09/2021   HGBA1C 5.5 06/09/2020   HGBA1C 5.5 02/25/2019   Lab Results  Component Value Date   INSULIN 51.7 (H) 07/08/2021   INSULIN 23.3 03/09/2021   INSULIN 50.6 (H) 12/18/2020   INSULIN 21.1 06/09/2020   Lab Results  Component Value Date   TSH 2.310 07/08/2021   Lab Results  Component Value Date   CHOL 170 07/08/2021   HDL 45 07/08/2021   LDLCALC 109 (H) 07/08/2021   TRIG 88 07/08/2021   CHOLHDL 3.8 07/08/2021   Lab Results  Component Value Date   VD25OH 55.4 07/08/2021   VD25OH 54.0 03/09/2021   VD25OH 53.2 12/18/2020   Lab Results  Component Value Date   WBC 6.4 07/08/2021   HGB 14.4 07/08/2021   HCT 42.2 07/08/2021   MCV 88 07/08/2021   PLT 231 07/08/2021   No results found for: IRON, TIBC, FERRITIN  Attestation Statements:   Reviewed by clinician on day of visit: allergies, medications, problem list, medical history, surgical history, family history, social history, and previous encounter notes.   ILennette Bihari, CMA, am acting as transcriptionist for Dr. Raliegh Scarlet, DO.  I have reviewed the above documentation for accuracy and completeness, and I agree with the above. Marjory Sneddon, D.O.  The [place] was signed into law in 2016 which includes the topic of electronic health records.  This provides immediate access to information in MyChart.  This includes consultation notes, operative notes, office notes, lab results and pathology reports.  If you have any questions about what you read please let us know at your next visit so we can discuss your concerns and take corrective action if need be.  We are right here with you.

## 2021-10-26 ENCOUNTER — Encounter: Payer: Self-pay | Admitting: Gastroenterology

## 2021-10-26 ENCOUNTER — Ambulatory Visit
Admission: RE | Admit: 2021-10-26 | Discharge: 2021-10-26 | Disposition: A | Discharge status: Home / Self Care | Payer: Managed Care, Other (non HMO) | Attending: Gastroenterology | Admitting: Gastroenterology

## 2021-10-26 ENCOUNTER — Ambulatory Visit: Payer: Managed Care, Other (non HMO) | Admitting: Anesthesiology

## 2021-10-26 ENCOUNTER — Encounter
Admission: RE | Disposition: A | Discharge status: Home / Self Care | Payer: Self-pay | Source: Home / Self Care | Attending: Gastroenterology

## 2021-10-26 DIAGNOSIS — Z809 Family history of malignant neoplasm, unspecified: Secondary | ICD-10-CM | POA: Insufficient documentation

## 2021-10-26 DIAGNOSIS — R011 Cardiac murmur, unspecified: Secondary | ICD-10-CM | POA: Diagnosis not present

## 2021-10-26 DIAGNOSIS — Z800 Family history of malignant neoplasm of digestive organs: Secondary | ICD-10-CM | POA: Insufficient documentation

## 2021-10-26 DIAGNOSIS — E7800 Pure hypercholesterolemia, unspecified: Secondary | ICD-10-CM | POA: Diagnosis not present

## 2021-10-26 DIAGNOSIS — Z803 Family history of malignant neoplasm of breast: Secondary | ICD-10-CM | POA: Insufficient documentation

## 2021-10-26 DIAGNOSIS — F419 Anxiety disorder, unspecified: Secondary | ICD-10-CM | POA: Diagnosis not present

## 2021-10-26 DIAGNOSIS — I10 Essential (primary) hypertension: Secondary | ICD-10-CM | POA: Insufficient documentation

## 2021-10-26 DIAGNOSIS — E785 Hyperlipidemia, unspecified: Secondary | ICD-10-CM | POA: Diagnosis not present

## 2021-10-26 DIAGNOSIS — K635 Polyp of colon: Secondary | ICD-10-CM

## 2021-10-26 DIAGNOSIS — D122 Benign neoplasm of ascending colon: Secondary | ICD-10-CM | POA: Insufficient documentation

## 2021-10-26 DIAGNOSIS — Z1211 Encounter for screening for malignant neoplasm of colon: Secondary | ICD-10-CM | POA: Insufficient documentation

## 2021-10-26 DIAGNOSIS — Z8041 Family history of malignant neoplasm of ovary: Secondary | ICD-10-CM | POA: Diagnosis not present

## 2021-10-26 DIAGNOSIS — D124 Benign neoplasm of descending colon: Secondary | ICD-10-CM | POA: Insufficient documentation

## 2021-10-26 HISTORY — PX: COLONOSCOPY WITH PROPOFOL: SHX5780

## 2021-10-26 SURGERY — COLONOSCOPY WITH PROPOFOL
Anesthesia: General

## 2021-10-26 MED ORDER — PROPOFOL 500 MG/50ML IV EMUL
INTRAVENOUS | Status: DC | PRN
  Administered 2021-10-26: 200 ug/kg/min via INTRAVENOUS

## 2021-10-26 MED ORDER — PHENYLEPHRINE HCL (PRESSORS) 10 MG/ML IV SOLN
INTRAVENOUS | Status: AC
  Filled 2021-10-26: qty 1

## 2021-10-26 MED ORDER — PROPOFOL 10 MG/ML IV BOLUS
INTRAVENOUS | Status: DC | PRN
  Administered 2021-10-26: 80 mg via INTRAVENOUS

## 2021-10-26 MED ORDER — PROPOFOL 500 MG/50ML IV EMUL
INTRAVENOUS | Status: AC
  Filled 2021-10-26: qty 50

## 2021-10-26 MED ORDER — SODIUM CHLORIDE 0.9 % IV SOLN: INTRAVENOUS | Status: DC

## 2021-10-26 NOTE — Anesthesia Preprocedure Evaluation (Signed)
Anesthesia Evaluation  Patient identified by MRN, date of birth, ID band Patient awake    Reviewed: Allergy & Precautions, NPO status , Patient's Chart, lab work & pertinent test results  History of Anesthesia Complications Negative for: history of anesthetic complications  Airway Mallampati: III  TM Distance: <3 FB Neck ROM: full    Dental  (+) Chipped   Pulmonary neg pulmonary ROS, neg shortness of breath,    Pulmonary exam normal        Cardiovascular Exercise Tolerance: Good hypertension, (-) anginaNormal cardiovascular exam     Neuro/Psych  Neuromuscular disease negative psych ROS   GI/Hepatic negative GI ROS, Neg liver ROS, neg GERD  ,  Endo/Other  negative endocrine ROS  Renal/GU negative Renal ROS  negative genitourinary   Musculoskeletal   Abdominal   Peds  Hematology negative hematology ROS (+)   Anesthesia Other Findings Past Medical History: No date: Anemia No date: Anxiety No date: Chest pain No date: Constipation No date: Family history of breast cancer No date: Family history of colon cancer No date: Family history of ovarian cancer     Comment:  8/19 and 9/21 cancer genetic testing letter sent No date: Family history of ovarian cancer No date: Heart murmur     Comment:  hx of per pt, "hasn't presented itself in years" No date: Heartburn No date: High blood pressure No date: Hot flashes     Comment:  "pretty much gone" No date: Hypercholesteremia No date: Hyperlipidemia No date: Insomnia No date: Joint pain No date: Liver problem No date: MGUS (monoclonal gammopathy of unknown significance)     Comment:  sees Dr. Tasia Catchings with hematology No date: MGUS (monoclonal gammopathy of unknown significance) No date: Nerve pain     Comment:  in hands and feet No date: Palpitations No date: SOB (shortness of breath) on exertion No date: Unspecified lump in the left breast, unspecified quadrant      Comment:  surgery scheduled for 07/15/20 2016: Vitamin D deficiency  Past Surgical History: 2001: ABDOMINAL HYSTERECTOMY 1982: APPENDECTOMY 1999: BREAST BIOPSY; Left     Comment:  cyst,benign 03/15/2019: BREAST BIOPSY; Left     Comment:  [name] bx venus clip radial scar 07/15/2020: BREAST LUMPECTOMY WITH RADIOACTIVE SEED LOCALIZATION; Left     Comment:  Procedure: LEFT BREAST LUMPECTOMY WITH RADIOACTIVE SEED               LOCALIZATION;  Surgeon: Jovita Kussmaul, MD;  Location:               [place];  Service: General;                Laterality: Left; 1985: CESAREAN SECTION 06/13/2012: COLONOSCOPY     Comment:  [facility]. Normal Exam. No date: HYSTERECTOMY ABDOMINAL WITH SALPINGECTOMY No date: mgus No date: OOPHORECTOMY 2000: OVARY SURGERY  BMI    Body Mass Index: 33.65 kg/m      Reproductive/Obstetrics negative OB ROS                             Anesthesia Physical Anesthesia Plan  ASA: 2  Anesthesia Plan: General   Post-op Pain Management:    Induction: Intravenous  PONV Risk Score and Plan: Propofol infusion and TIVA  Airway Management Planned: Natural Airway and Nasal Cannula  Additional Equipment:   Intra-op Plan:   Post-operative Plan:   Informed Consent: I have reviewed the patients History and  Physical, chart, labs and discussed the procedure including the risks, benefits and alternatives for the proposed anesthesia with the patient or authorized representative who has indicated his/her understanding and acceptance.     Dental Advisory Given  Plan Discussed with: Anesthesiologist, CRNA and Surgeon  Anesthesia Plan Comments: (Patient consented for risks of anesthesia including but not limited to:  - adverse reactions to medications - risk of airway placement if required - damage to eyes, teeth, lips or other oral mucosa - nerve damage due to positioning  - sore throat or hoarseness - Damage to heart, brain,  nerves, lungs, other parts of body or loss of life  Patient voiced understanding.)        Anesthesia Quick Evaluation

## 2021-10-26 NOTE — Anesthesia Procedure Notes (Signed)
Date/Time: 10/26/2021 8:05 AM Performed by: Nelda Marseille, CRNA Pre-anesthesia Checklist: Patient identified, Emergency Drugs available, Suction available, Patient being monitored and Timeout performed Oxygen Delivery Method: Nasal cannula

## 2021-10-26 NOTE — Anesthesia Postprocedure Evaluation (Signed)
Anesthesia Post Note  Patient: Andrea Jones  Procedure(s) Performed: COLONOSCOPY WITH PROPOFOL  Patient location during evaluation: Endoscopy Anesthesia Type: General Level of consciousness: awake and alert Pain management: pain level controlled Vital Signs Assessment: post-procedure vital signs reviewed and stable Respiratory status: spontaneous breathing, nonlabored ventilation, respiratory function stable and patient connected to nasal cannula oxygen Cardiovascular status: blood pressure returned to baseline and stable Postop Assessment: no apparent nausea or vomiting Anesthetic complications: no   No notable events documented.   Last Vitals:  Vitals:   10/26/21 0827 10/26/21 0837  BP: 109/69 116/77  Pulse: 79 76  Resp: 14 19  Temp:    SpO2: 98% 98%    Last Pain:  Vitals:   10/26/21 0837  TempSrc:   PainSc: 0-No pain                 Precious Haws Fleur Audino

## 2021-10-26 NOTE — Transfer of Care (Signed)
Immediate Anesthesia Transfer of Care Note  Patient: Andrea Jones  Procedure(s) Performed: COLONOSCOPY WITH PROPOFOL  Patient Location: PACU  Anesthesia Type:General  Level of Consciousness: awake, alert  and oriented  Airway & Oxygen Therapy: Patient Spontanous Breathing and Patient connected to nasal cannula oxygen  Post-op Assessment: Report given to RN and Post -op Vital signs reviewed and stable  Post vital signs: Reviewed and stable  Last Vitals:  Vitals Value Taken Time  BP 91/78 10/26/21 0817  Temp 36.2 C 10/26/21 0817  Pulse 72 10/26/21 0818  Resp 15 10/26/21 0818  SpO2 97 % 10/26/21 0818  Vitals shown include unvalidated device data.  Last Pain:  Vitals:   10/26/21 0817  TempSrc: Temporal  PainSc: 0-No pain         Complications: No notable events documented.

## 2021-10-26 NOTE — Op Note (Signed)
[facility] Patient Name: Andrea Jones Procedure Date: 10/26/2021 7:16 AM MRN: [id number] Account #: [id number] Date of Birth: 09/23/1960 Admit Type: Outpatient Age: 61 Room: [facility] ENDO ROOM 2 Gender: Female Note Status: Finalized Instrument Name: Park Meo [id number] Procedure:             Colonoscopy Indications:           Screening in patient at increased risk: Family history                         of 1st-degree relative with colorectal cancer Providers:             Jonathon Bellows MD, MD Referring MD:          Adrian Prows (Referring MD) Medicines:             Monitored Anesthesia Care Complications:         No immediate complications. Procedure:             Pre-Anesthesia Assessment:                        - Prior to the procedure, a History and Physical was                         performed, and patient medications, allergies and                         sensitivities were reviewed. The patient's tolerance                         of previous anesthesia was reviewed.                        - The risks and benefits of the procedure and the                         sedation options and risks were discussed with the                         patient. All questions were answered and informed                         consent was obtained.                        - ASA Grade Assessment: II - A patient with mild                         systemic disease.                        After obtaining informed consent, the colonoscope was                         passed under direct vision. Throughout the procedure,                         the patient's blood pressure, pulse, and oxygen  saturations were monitored continuously. The                         Colonoscope was introduced through the anus and                         advanced to the the cecum, identified by the                         appendiceal orifice. The colonoscopy was performed                          without difficulty. The patient tolerated the                         procedure well. The quality of the bowel preparation                         was good. Findings:      The perianal and digital rectal examinations were normal.      Two sessile polyps were found in the ascending colon. The polyps were 6       to 8 mm in size. These polyps were removed with a cold snare. Resection       and retrieval were complete.      A 4 mm polyp was found in the descending colon. The polyp was sessile.       The polyp was removed with a jumbo cold forceps. Resection and retrieval       were complete.      The exam was otherwise without abnormality on direct and retroflexion       views. Impression:            - Two 6 to 8 mm polyps in the ascending colon, removed                         with a cold snare. Resected and retrieved.                        - One 4 mm polyp in the descending colon, removed with                         a jumbo cold forceps. Resected and retrieved.                        - The examination was otherwise normal on direct and                         retroflexion views. Recommendation:        - Discharge patient to home (with escort).                        - Resume previous diet.                        - Continue present medications.                        - Await pathology results.                        -  Repeat colonoscopy for surveillance based on                         pathology results. Procedure Code(s):     --- Professional ---                        [phone], Colonoscopy, flexible; with removal of                         tumor(s), polyp(s), or other lesion(s) by snare                         technique                        45380, 5, Colonoscopy, flexible; with biopsy, single                         or multiple Diagnosis Code(s):     --- Professional ---                        K63.5, Polyp of colon                        Z80.0, Family  history of malignant neoplasm of                         digestive organs CPT copyright 2019 American Medical Association. All rights reserved. The codes documented in this report are preliminary and upon coder review may  be revised to meet current compliance requirements. Jonathon Bellows, MD Jonathon Bellows MD, MD 10/26/2021 8:16:38 AM This report has been signed electronically. Number of Addenda: 0 Note Initiated On: 10/26/2021 7:16 AM Scope Withdrawal Time: 0 hours 15 minutes 22 seconds  Total Procedure Duration: 0 hours 19 minutes 18 seconds  Estimated Blood Loss:  Estimated blood loss: none.      [facility]

## 2021-10-26 NOTE — H&P (Signed)
Jonathon Bellows, MD [address], [place], [place], [place], [address] [address], [place], [place], [place], [address] Phone: [phone]  Fax: [phone]  Primary Care Physician:  Leonel Ramsay, MD   Pre-Procedure History & Physical: HPI:  Andrea Jones is a 61 y.o. female is here for an colonoscopy.   Past Medical History:  Diagnosis Date   Anemia    Anxiety    Chest pain    Constipation    Family history of breast cancer    Family history of colon cancer    Family history of ovarian cancer    8/19 and 9/21 cancer genetic testing letter sent   Family history of ovarian cancer    Heart murmur    hx of per pt, "hasn't presented itself in years"   Heartburn    High blood pressure    Hot flashes    "pretty much gone"   Hypercholesteremia    Hyperlipidemia    Insomnia    Joint pain    Liver problem    MGUS (monoclonal gammopathy of unknown significance)    sees Dr. Tasia Catchings with hematology   MGUS (monoclonal gammopathy of unknown significance)    Nerve pain    in hands and feet   Palpitations    SOB (shortness of breath) on exertion    Unspecified lump in the left breast, unspecified quadrant    surgery scheduled for 07/15/20   Vitamin D deficiency 2016    Past Surgical History:  Procedure Laterality Date   ABDOMINAL HYSTERECTOMY  2001   APPENDECTOMY  1982   BREAST BIOPSY Left 1999   cyst,benign   BREAST BIOPSY Left 03/15/2019   [name] bx venus clip radial scar   BREAST LUMPECTOMY WITH RADIOACTIVE SEED LOCALIZATION Left 07/15/2020   Procedure: LEFT BREAST LUMPECTOMY WITH RADIOACTIVE SEED LOCALIZATION;  Surgeon: Jovita Kussmaul, MD;  Location: [place];  Service: General;  Laterality: Left;   [place]   COLONOSCOPY  06/13/2012   [facility]. Normal Exam.   HYSTERECTOMY ABDOMINAL WITH SALPINGECTOMY     mgus     OOPHORECTOMY     OVARY SURGERY  2000    Prior to Admission medications   Medication Sig Start Date End Date  Taking? Authorizing Provider  amLODipine (NORVASC) 10 MG tablet Take 1 tablet by mouth daily. 09/24/21 09/24/22 Yes [provider]  Biotin 10000 MCG TABS Take 1 each by mouth daily.    [provider]  levocetirizine (XYZAL) 5 MG tablet Take 1 tablet (5 mg total) by mouth every evening. 10/12/21   Mellody Dance, DO  metFORMIN (GLUCOPHAGE) 500 MG tablet '500mg'$  po daily with lunch 10/12/21   Mellody Dance, DO  Multiple Vitamins-Minerals (WOMENS MULTIVITAMIN PO) Take by mouth.    [provider]  Omega-3 Fatty Acids (OMEGA 3 PO) Take 1,200 mg by mouth daily.    [provider]  rosuvastatin (CRESTOR) 10 MG tablet Take 1 tablet (10 mg total) by mouth at bedtime. 08/05/21   Opalski, Neoma Laming, DO  Vitamin D, Ergocalciferol, (DRISDOL) 1.25 MG (50000 UNIT) CAPS capsule Take 1 capsule (50,000 Units total) by mouth every 7 (seven) days. 10/12/21   Opalski, Deborah, DO  vitamin E 1000 UNIT capsule Take 1,000 Units by mouth daily.    [provider]    Allergies as of 08/30/2021   (No Known Allergies)    Family History  Problem Relation Age of Onset   Colon cancer  Father 97   Heart disease Father    Cancer Father    Alcoholism Father    Congestive Heart Failure Mother    Emphysema Mother    Hypertension Mother    Kidney failure Mother    Neuropathy Mother    Hyperlipidemia Mother    Depression Mother    Anxiety disorder Mother    Obesity Mother    Ovarian cancer Sister 58       pat 1/2 sister   Breast cancer Cousin 78   Breast cancer Cousin     Social History   Socioeconomic History   Marital status: Married    Spouse name: Not on file   Number of children: 3   Years of education: Not on file   Highest education level: Not on file  Occupational History   Occupation: Civil engineer, contracting  Tobacco Use   Smoking status: Never   Smokeless tobacco: Never  Vaping Use   Vaping Use: Never used  Substance and Sexual Activity   Alcohol use: No     Alcohol/week: 0.0 standard drinks   Drug use: No   Sexual activity: Not Currently  Other Topics Concern   Not on file  Social History Narrative   Lives at home with husband    Right handed   Caffeine: maybe 1 cup maybe 3 days a week   Social Determinants of Radio broadcast assistant Strain: Not on file  Food Insecurity: Not on file  Transportation Needs: Not on file  Physical Activity: Not on file  Stress: Not on file  Social Connections: Not on file  Intimate Partner Violence: Not on file    Review of Systems: See HPI, otherwise negative ROS  Physical Exam: BP (!) 150/85   Pulse 79   Temp (!) 97 F (36.1 C) (Temporal)   Resp 16   Ht '5\' 2"'$  (1.575 m)   Wt 83.5 kg   SpO2 100%   BMI 33.65 kg/m  General:   Alert,  pleasant and cooperative in NAD Head:  Normocephalic and atraumatic. Neck:  Supple; no masses or thyromegaly. Lungs:  Clear throughout to auscultation, normal respiratory effort.    Heart:  +S1, +S2, Regular rate and rhythm, No edema. Abdomen:  Soft, nontender and nondistended. Normal bowel sounds, without guarding, and without rebound.   Neurologic:  Alert and  oriented x4;  grossly normal neurologically.  Impression/Plan: Andrea Jones is here for an colonoscopy to be performed for colon cancer screening , father had colon cancer Risks, benefits, limitations, and alternatives regarding  colonoscopy have been reviewed with the patient.  Questions have been answered.  All parties agreeable.   Jonathon Bellows, MD  10/26/2021, 7:41 AM

## 2021-10-27 ENCOUNTER — Encounter: Payer: Self-pay | Admitting: Gastroenterology

## 2021-10-27 LAB — SURGICAL PATHOLOGY

## 2021-11-03 ENCOUNTER — Inpatient Hospital Stay: Payer: Managed Care, Other (non HMO) | Attending: Oncology | Admitting: Oncology

## 2021-11-03 ENCOUNTER — Encounter: Payer: Self-pay | Admitting: Oncology

## 2021-11-03 VITALS — BP 144/89 | HR 70 | Temp 97.0°F | Wt 188.0 lb

## 2021-11-03 DIAGNOSIS — Z90710 Acquired absence of both cervix and uterus: Secondary | ICD-10-CM | POA: Diagnosis not present

## 2021-11-03 DIAGNOSIS — Z803 Family history of malignant neoplasm of breast: Secondary | ICD-10-CM | POA: Diagnosis not present

## 2021-11-03 DIAGNOSIS — Z8041 Family history of malignant neoplasm of ovary: Secondary | ICD-10-CM | POA: Diagnosis not present

## 2021-11-03 DIAGNOSIS — Z800 Family history of malignant neoplasm of digestive organs: Secondary | ICD-10-CM | POA: Insufficient documentation

## 2021-11-03 DIAGNOSIS — D472 Monoclonal gammopathy: Secondary | ICD-10-CM | POA: Diagnosis present

## 2021-11-03 DIAGNOSIS — N6489 Other specified disorders of breast: Secondary | ICD-10-CM | POA: Insufficient documentation

## 2021-11-03 NOTE — Progress Notes (Signed)
Hematology/Oncology follow up  note [phone] [phone]   Patient Care Team: Leonel Ramsay, MD as PCP - General (Infectious Diseases) [name], MD (Inactive) as PCP - OBGYN (Obstetrics and Gynecology) Ammie Dalton, Okey Regal, MD (Unknown Physician Specialty) Bary Castilla Forest Gleason, MD (General Surgery) Earlie Server, MD as Consulting Physician (Oncology)  REFERRING PROVIDER: Leonel Ramsay, MD  CHIEF COMPLAINTS/REASON FOR VISIT:  Follow-up for MGUS  HISTORY OF PRESENTING ILLNESS:  Andrea Jones is a 61 y.o. female presents for follow-up of MGUS.  neurology work-up for foot numbness and tingling.Marland Kitchen  SPEP showed 0.9 g/dL M spike,   , and IFE showed IgG Kappa monoclonal protein.  No aggravating or alleviated factors.  Associated signs or symptoms:  Neuropathy: Toe numbness and tingling. Denies weight loss, fever chills, bone pain, Bone pain: Denies Anemia denies  Patient also recently had an abnormal screening mammogram on 02/22/2019 and the patient was called back to perform unilateral left diagnostic breast mammogram on 03/06/2019 Showed irregular hypoechoic mass 0.6 x 0.4 x 0.7 cm in the left breast is suspicious for malignancy.  Axillary lymph node status was not commented on mammogram or targeted ultrasound. Patient is status post left breast mass biopsy-03/15/2019, biopsy showed radial scar/complex sclerosing lesion.  # 03/15/2019 left breast biopsy showed radial scar/complex sclerosing lesion # Radial scar/complex sclerosing lesion, status post resection.  Family history of ovarian cancer and breast cancer.  Patient has met genetic counselor and genetic testing is negative.  Pregnancy / Birth History Age at menarche   42 years. Age of menopause   25-50 Gravida   3 Maternal age   13-25 Para   38  INTERVAL HISTORY Andrea Jones is a 61 y.o. female who has above history reviewed by me today presents for follow up visit for management  of MGUS, history of radial scar Patient had a blood work done at [facility].  She has no new complaints except that she recently has had vision problems for which she follows up with ophthalmologist.    Review of Systems  Constitutional:  Negative for appetite change, chills, fatigue and fever.  HENT:   Negative for hearing loss and voice change.   Eyes:  Negative for eye problems.  Respiratory:  Negative for chest tightness and cough.   Cardiovascular:  Negative for chest pain.  Gastrointestinal:  Negative for abdominal distention, abdominal pain and blood in stool.  Endocrine: Negative for hot flashes.  Genitourinary:  Negative for difficulty urinating and frequency.   Musculoskeletal:  Negative for arthralgias.  Skin:  Negative for itching and rash.  Neurological:  Negative for extremity weakness.  Hematological:  Negative for adenopathy.  Psychiatric/Behavioral:  Negative for confusion.     MEDICAL HISTORY:  Past Medical History:  Diagnosis Date   Anemia    Anxiety    Chest pain    Constipation    Family history of breast cancer    Family history of colon cancer    Family history of ovarian cancer    8/19 and 9/21 cancer genetic testing letter sent   Family history of ovarian cancer    Heart murmur    hx of per pt, "hasn't presented itself in years"   Heartburn    High blood pressure    Hot flashes    "pretty much gone"   Hypercholesteremia    Hyperlipidemia    Insomnia    Joint pain    Liver problem    MGUS (monoclonal gammopathy of  unknown significance)    sees Dr. Tasia Catchings with hematology   MGUS (monoclonal gammopathy of unknown significance)    Nerve pain    in hands and feet   Palpitations    SOB (shortness of breath) on exertion    Unspecified lump in the left breast, unspecified quadrant    surgery scheduled for 07/15/20   Vitamin D deficiency 2016    SURGICAL HISTORY: Past Surgical History:  Procedure Laterality Date   ABDOMINAL HYSTERECTOMY  2001    APPENDECTOMY  1982   BREAST BIOPSY Left 1999   cyst,benign   BREAST BIOPSY Left 03/15/2019   [name] bx venus clip radial scar   BREAST LUMPECTOMY WITH RADIOACTIVE SEED LOCALIZATION Left 07/15/2020   Procedure: LEFT BREAST LUMPECTOMY WITH RADIOACTIVE SEED LOCALIZATION;  Surgeon: Jovita Kussmaul, MD;  Location: [place];  Service: General;  Laterality: Left;   [place]   COLONOSCOPY  06/13/2012   [place]. Normal Exam.   COLONOSCOPY WITH PROPOFOL N/A 10/26/2021   Procedure: COLONOSCOPY WITH PROPOFOL;  Surgeon: Jonathon Bellows, MD;  [facility] ENDOSCOPY;  Service: Gastroenterology;  Laterality: N/A;   HYSTERECTOMY ABDOMINAL WITH SALPINGECTOMY     mgus     OOPHORECTOMY     OVARY SURGERY  2000    SOCIAL HISTORY: Social History   Socioeconomic History   Marital status: Married    Spouse name: Not on file   Number of children: 3   Years of education: Not on file   Highest education level: Not on file  Occupational History   Occupation: Civil engineer, contracting  Tobacco Use   Smoking status: Never   Smokeless tobacco: Never  Vaping Use   Vaping Use: Never used  Substance and Sexual Activity   Alcohol use: No    Alcohol/week: 0.0 standard drinks   Drug use: No   Sexual activity: Not Currently  Other Topics Concern   Not on file  Social History Narrative   Lives at home with husband    Right handed   Caffeine: maybe 1 cup maybe 3 days a week   Social Determinants of Radio broadcast assistant Strain: Not on file  Food Insecurity: Not on file  Transportation Needs: Not on file  Physical Activity: Not on file  Stress: Not on file  Social Connections: Not on file  Intimate Partner Violence: Not on file    FAMILY HISTORY: Family History  Problem Relation Age of Onset   Colon cancer Father 23   Heart disease Father    Cancer Father    Alcoholism Father    Congestive Heart Failure Mother    Emphysema Mother    Hypertension Mother    Kidney  failure Mother    Neuropathy Mother    Hyperlipidemia Mother    Depression Mother    Anxiety disorder Mother    Obesity Mother    Ovarian cancer Sister 59       pat 1/2 sister   Breast cancer Cousin 38   Breast cancer Cousin     ALLERGIES:  has No Known Allergies.  MEDICATIONS:  Current Outpatient Medications  Medication Sig Dispense Refill   amLODipine (NORVASC) 10 MG tablet Take 1 tablet by mouth daily.     Biotin 10000 MCG TABS Take 1 each by mouth daily.     levocetirizine (XYZAL) 5 MG tablet Take 1 tablet (5 mg total) by mouth every evening. 30 tablet 0   metFORMIN (GLUCOPHAGE) 500 MG tablet 59m po  daily with lunch 30 tablet 0   Multiple Vitamins-Minerals (WOMENS MULTIVITAMIN PO) Take by mouth.     Omega-3 Fatty Acids (OMEGA 3 PO) Take 1,200 mg by mouth daily.     rosuvastatin (CRESTOR) 10 MG tablet Take 1 tablet (10 mg total) by mouth at bedtime. 30 tablet 0   Vitamin D, Ergocalciferol, (DRISDOL) 1.25 MG (50000 UNIT) CAPS capsule Take 1 capsule (50,000 Units total) by mouth every 7 (seven) days. 4 capsule 0   vitamin E 1000 UNIT capsule Take 1,000 Units by mouth daily.     No current facility-administered medications for this visit.     PHYSICAL EXAMINATION: ECOG PERFORMANCE STATUS: 0 - Asymptomatic Vitals:   11/03/21 1010  BP: (!) 144/89  Pulse: 70  Temp: (!) 97 F (36.1 C)   Filed Weights   11/03/21 1010  Weight: 188 lb (85.3 kg)    Physical Exam Constitutional:      General: She is not in acute distress. HENT:     Head: Normocephalic and atraumatic.  Eyes:     General: No scleral icterus.    Pupils: Pupils are equal, round, and reactive to light.  Cardiovascular:     Rate and Rhythm: Normal rate and regular rhythm.     Heart sounds: Normal heart sounds.  Pulmonary:     Effort: Pulmonary effort is normal. No respiratory distress.     Breath sounds: No wheezing.  Abdominal:     General: Bowel sounds are normal. There is no distension.      Palpations: Abdomen is soft. There is no mass.     Tenderness: There is no abdominal tenderness.  Musculoskeletal:        General: No deformity. Normal range of motion.     Cervical back: Normal range of motion and neck supple.  Skin:    General: Skin is warm and dry.     Findings: No erythema or rash.  Neurological:     Mental Status: She is alert and oriented to person, place, and time.     Cranial Nerves: No cranial nerve deficit.     Coordination: Coordination normal.  Psychiatric:        Behavior: Behavior normal.        Thought Content: Thought content normal.     LABORATORY DATA:  I have reviewed the data as listed Lab Results  Component Value Date   WBC 6.4 07/08/2021   HGB 14.4 07/08/2021   HCT 42.2 07/08/2021   MCV 88 07/08/2021   PLT 231 07/08/2021   Recent Labs    12/18/20 0717 03/09/21 1048 07/08/21 0853  NA  --  142 143  K  --  4.3 4.6  CL  --  103 105  CO2  --  23 24  GLUCOSE  --  88 97  BUN  --  12 13  CREATININE  --  0.89 0.84  CALCIUM  --  10.0 9.7  PROT 7.2 7.9 7.4  ALBUMIN 4.5 5.1* 4.7  AST 18 22 32  ALT 26 29 43*  ALKPHOS 55 69 64  BILITOT 0.5 0.5 0.3  BILIDIR 0.13  --   --     Iron/TIBC/Ferritin/ %Sat No results found for: IRON, TIBC, FERRITIN, IRONPCTSAT   09/27/19 M protein 0.9 04/01/2020 M protein 1.1 09/29/2020 M protein 1, IgG 1415, hemoglobin 14.2, creatinine 0.95 04/12/2021, M protein 0.9, IgG 1495, hemoglobin 14.7, creatinine 0.95, free light chain ratio 3.6. 10/18/2021, M protein 1.1, IgG 1532, free light ratio,  3.94, hemoglobin 14.1, creatinine 0.85. 24-hour urine protein electrophoresis showed no M protein.   RADIOGRAPHIC STUDIES: I have personally reviewed the radiological images as listed and agreed with the findings in the report.  MM 3D SCREEN BREAST BILATERAL  Result Date: 10/06/2021 CLINICAL DATA:  Screening. EXAM: DIGITAL SCREENING BILATERAL MAMMOGRAM WITH TOMOSYNTHESIS AND CAD TECHNIQUE: Bilateral screening digital  craniocaudal and mediolateral oblique mammograms were obtained. Bilateral screening digital breast tomosynthesis was performed. The images were evaluated with computer-aided detection. COMPARISON:  Previous exam(s). ACR Breast Density Category b: There are scattered areas of fibroglandular density. FINDINGS: There are no findings suspicious for malignancy. IMPRESSION: No mammographic evidence of malignancy. A result letter of this screening mammogram will be mailed directly to the patient. RECOMMENDATION: Screening mammogram in one year. (Code:SM-B-01Y) BI-RADS CATEGORY  1: Negative. Electronically Signed   By: Ileana Roup M.D.   On: 10/06/2021 08:59     ASSESSMENT & PLAN:  1. MGUS (monoclonal gammopathy of unknown significance)   2. Radial scar of breast   # IgG MGUS [facility] results were reviewed and discussed with patient Stable kidney function and no anemia. M protein slightly increased 1.1, light chain ratio increased to 3.94.  Continue observation every 6 months. [facility] prescription was provided.  #Radial scar/complex sclerosing lesion, status post resection.  Genetic testing negative.  Continue annual mammogram, patient gets image ordered via primary care provider's office.  Recent mammogram 10/06/2021 is negative for malignancy.  [facility] prescription for next visit in 6 months. All questions were answered. The patient knows to call the clinic with any problems questions or concerns.   Return of visit: 6 months  Earlie Server, MD, PhD 11/03/2021

## 2021-11-10 ENCOUNTER — Encounter (INDEPENDENT_AMBULATORY_CARE_PROVIDER_SITE_OTHER): Payer: Self-pay | Admitting: Family Medicine

## 2021-11-10 ENCOUNTER — Ambulatory Visit (INDEPENDENT_AMBULATORY_CARE_PROVIDER_SITE_OTHER): Payer: Managed Care, Other (non HMO) | Admitting: Family Medicine

## 2021-11-10 VITALS — BP 124/78 | HR 65 | Temp 97.9°F | Ht 62.0 in | Wt 184.0 lb

## 2021-11-10 DIAGNOSIS — Z7984 Long term (current) use of oral hypoglycemic drugs: Secondary | ICD-10-CM

## 2021-11-10 DIAGNOSIS — E559 Vitamin D deficiency, unspecified: Secondary | ICD-10-CM

## 2021-11-10 DIAGNOSIS — K760 Fatty (change of) liver, not elsewhere classified: Secondary | ICD-10-CM | POA: Diagnosis not present

## 2021-11-10 DIAGNOSIS — Z9189 Other specified personal risk factors, not elsewhere classified: Secondary | ICD-10-CM

## 2021-11-10 DIAGNOSIS — E7849 Other hyperlipidemia: Secondary | ICD-10-CM | POA: Diagnosis not present

## 2021-11-10 DIAGNOSIS — E538 Deficiency of other specified B group vitamins: Secondary | ICD-10-CM

## 2021-11-10 DIAGNOSIS — E669 Obesity, unspecified: Secondary | ICD-10-CM

## 2021-11-10 DIAGNOSIS — E8881 Metabolic syndrome: Secondary | ICD-10-CM | POA: Diagnosis not present

## 2021-11-10 DIAGNOSIS — Z6833 Body mass index (BMI) 33.0-33.9, adult: Secondary | ICD-10-CM

## 2021-11-10 MED ORDER — VITAMIN D (ERGOCALCIFEROL) 1.25 MG (50000 UNIT) PO CAPS: 50000.0000 [IU] | ORAL_CAPSULE | ORAL | 0 refills | Status: DC

## 2021-11-15 NOTE — Progress Notes (Signed)
Chief Complaint:   OBESITY Andrea Jones is here to discuss her progress with her obesity treatment plan along with follow-up of her obesity related diagnoses. Andrea Jones is on keeping a food journal and adhering to recommended goals of 1500 calories and 100 grams protein and states she is following her eating plan approximately 70% of the time. Andrea Jones states she is walking 30 minutes 2 times per week.  Today's visit was #: 23 Starting weight: 183 lbs Starting date: 06/19/2020 Today's weight: 184 lbs Today's date: 11/10/2021 Total lbs lost to date: 0 Total lbs lost since last in-office visit: 1  Interim History: Andrea Jones restarted walking 2 days a week and wants to increase that. She recently had labs done with oncology on 10/18/2021.  Subjective:   1. Other hyperlipidemia Pt has a history of NAFLD and is now on a statin. She is drinking about 50 oz of water per day. Medication: Crestor 10 mg QHS  2. Insulin resistance Goal is HgbA1c < 5.7, fasting insulin closer to 5. Medication: Metformin  3. NAFLD (nonalcoholic fatty liver disease) Discussed labs with patient today. Pt's recent ALT was 51 and AST was 35 on 10/18/21. Pt is also dehydrated.  4. Vitamin D deficiency She is currently taking prescription vitamin D 50,000 IU each week. She denies nausea, vomiting or muscle weakness.  5. B12 deficiency Andrea Jones's last B12 check was approximately 4 months ago and was greater than 2000. She discontinued B12 supplementation at that time.  6. At risk for dehydration Andrea Jones is at risk of dehydration due to recent labs and inadequate water intake.  Assessment/Plan:   Orders Placed This Encounter  Procedures   Comprehensive metabolic panel   Hemoglobin A1c   Insulin, random   Vitamin B12   Lipid Panel With LDL/HDL Ratio   VITAMIN D 25 Hydroxy (Vit-D Deficiency, Fractures)    Medications Discontinued During This Encounter  Medication Reason   Vitamin D, Ergocalciferol, (DRISDOL) 1.25 MG  (50000 UNIT) CAPS capsule Reorder     Meds ordered this encounter  Medications   Vitamin D, Ergocalciferol, (DRISDOL) 1.25 MG (50000 UNIT) CAPS capsule    Sig: Take 1 capsule (50,000 Units total) by mouth every 7 (seven) days.    Dispense:  4 capsule    Refill:  0    Ov for RF     1. Other hyperlipidemia Cardiovascular risk and specific lipid/LDL goals reviewed.  We discussed several lifestyle modifications today and Milley will continue to work on diet, exercise and weight loss efforts. Orders and follow up as documented in patient record.   Counseling Intensive lifestyle modifications are the first line treatment for this issue. Dietary changes: Increase soluble fiber. Decrease simple carbohydrates. Exercise changes: Moderate to vigorous-intensity aerobic activity 150 minutes per week if tolerated. Lipid-lowering medications: see documented in medical record. Check fasting labs at next OV.  - Lipid Panel With LDL/HDL Ratio  2. Insulin resistance Sydnie will continue to work on weight loss, exercise, and decreasing simple carbohydrates to help decrease the risk of diabetes. Larina agreed to follow-up with [name] as directed to closely monitor her progress. Check fasting labs at next OV.  - Hemoglobin A1c; Future - Insulin, random; Future  3. NAFLD (nonalcoholic fatty liver disease) We discussed the likely diagnosis of non-alcoholic fatty liver disease today and how this condition is obesity related. Clydine was educated the importance of weight loss. Hartlyn agreed to continue with her weight loss efforts with healthier diet and exercise as an essential part  of her treatment plan. Check fasting labs at next OV.  - Comprehensive metabolic panel; Future  4. Vitamin D deficiency Low Vitamin D level contributes to fatigue and are associated with obesity, breast, and colon cancer. She agrees to continue to take prescription Vitamin D '@50'$ ,000 IU every week and will follow-up for routine  testing of Vitamin D, at least 2-3 times per year to avoid over-replacement. Check fasting labs at next OV.  Refill- Vitamin D, Ergocalciferol, (DRISDOL) 1.25 MG (50000 UNIT) CAPS capsule; Take 1 capsule (50,000 Units total) by mouth every 7 (seven) days.  Dispense: 4 capsule; Refill: 0  - VITAMIN D 25 Hydroxy (Vit-D Deficiency, Fractures); Future  5. B12 deficiency The diagnosis was reviewed with the patient. Counseling provided today, see below. We will continue to monitor. Orders and follow up as documented in patient record.  Counseling The body needs vitamin B12: to make red blood cells; to make DNA; and to help the nerves work properly so they can carry messages from the brain to the body.  The main causes of vitamin B12 deficiency include dietary deficiency, digestive diseases, pernicious anemia, and having a surgery in which part of the stomach or small intestine is removed.  Certain medicines can make it harder for the body to absorb vitamin B12. These medicines include: heartburn medications; some antibiotics; some medications used to treat diabetes, gout, and high cholesterol.  In some cases, there are no symptoms of this condition. If the condition leads to anemia or nerve damage, various symptoms can occur, such as weakness or fatigue, shortness of breath, and numbness or tingling in your hands and feet.   Treatment:  May include taking vitamin B12 supplements.  Avoid alcohol.  Eat lots of healthy foods that contain vitamin B12: Beef, pork, chicken, [place], and organ meats, such as liver.  Seafood: This includes clams, rainbow trout, salmon, tuna, and haddock. Eggs.  Cereal and dairy products that are fortified: This means that vitamin B12 has been added to the food.  Check fasting labs at next OV.  - Vitamin B12; Future  6. At risk for dehydration Andrea Jones is at higher than average risk of dehydration. Andrea Jones was given more than 10 minutes of proper hydration counseling today.  We discussed the signs and symptoms of dehydration, some of which may include muscle cramping, constipation or even orthostatic symptoms. Counseling on the prevention of dehydration was also provided today. Andrea Jones is at risk for dehydration due to weight loss, lifestyle and behavorial habits and possibly due to taking certain medication(s). She was encouraged to adequately hydrate and monitor fluid status to avoid dehydration as well as weight loss plateaus. Unless pre-existing renal or cardiopulmonary conditions exist, in which patient was told to limit their fluid intake, I recommended roughly one half of their weight in pounds to be the approximate ounces of non-caloric, non-caffeinated beverages they should drink per day; including more if they are engaging in exercise.  Andrea Jones is at higher than average risk of dehydration.  Andrea Jones was given more than 9 minutes of proper hydration counseling today. We discussed the signs and symptoms of dehydration, some of which may include muscle cramping, constipation, or even orthostatic symptoms. Counseling on the prevention of dehydration was also provided today. Andrea Jones is at risk for dehydration due to weight loss, lifestyle and behavorial habits, and possibly due to taking certain medication(s). She was encouraged to adequately hydrate and monitor fluid status to avoid dehydration as well as weight loss plateaus. Unless pre-existing renal or  cardiopulmonary conditions exist, which pt was told to limit their fluid intake. I recommended roughly one half of their weight in pounds to be the approximate ounces of non-caloric, non-caffeinated beverages they should drink per day; including more if they are engaging in exercise.  - Comprehensive metabolic panel; Future  7. Obesity, Current BMI 33.7 Andrea Jones is currently in the action stage of change. As such, her goal is to continue with weight loss efforts. She has agreed to keeping a food journal and adhering to  recommended goals of 1400-1500 calories and 100+ grams protein.   Goal settings:  SMART goals discussed with pt. Bring journaling log to next OV. Pt declines labs today. We will obtain fasting labs at next OV.  Exercise goals:  As is and increase as tolerated.  Behavioral modification strategies: keeping a strict food journal.  Lada has agreed to follow-up with our clinic in 4 weeks. She was informed of the importance of frequent follow-up visits to maximize her success with intensive lifestyle modifications for her multiple health conditions.   Objective:   Blood pressure 124/78, pulse 65, temperature 97.9 F (36.6 C), height '5\' 2"'$  (1.575 m), weight 184 lb (83.5 kg), SpO2 96 %. Body mass index is 33.65 kg/m.  General: Cooperative, alert, well developed, in no acute distress. HEENT: Conjunctivae and lids unremarkable. Cardiovascular: Regular rhythm.  Lungs: Normal work of breathing. Neurologic: No focal deficits.   Lab Results  Component Value Date   CREATININE 0.84 07/08/2021   BUN 13 07/08/2021   NA 143 07/08/2021   K 4.6 07/08/2021   CL 105 07/08/2021   CO2 24 07/08/2021   Lab Results  Component Value Date   ALT 43 (H) 07/08/2021   AST 32 07/08/2021   ALKPHOS 64 07/08/2021   BILITOT 0.3 07/08/2021   Lab Results  Component Value Date   HGBA1C 5.4 07/08/2021   HGBA1C 5.2 03/09/2021   HGBA1C 5.5 06/09/2020   HGBA1C 5.5 02/25/2019   Lab Results  Component Value Date   INSULIN 51.7 (H) 07/08/2021   INSULIN 23.3 03/09/2021   INSULIN 50.6 (H) 12/18/2020   INSULIN 21.1 06/09/2020   Lab Results  Component Value Date   TSH 2.310 07/08/2021   Lab Results  Component Value Date   CHOL 170 07/08/2021   HDL 45 07/08/2021   LDLCALC 109 (H) 07/08/2021   TRIG 88 07/08/2021   CHOLHDL 3.8 07/08/2021   Lab Results  Component Value Date   VD25OH 55.4 07/08/2021   VD25OH 54.0 03/09/2021   VD25OH 53.2 12/18/2020   Lab Results  Component Value Date   WBC 6.4  07/08/2021   HGB 14.4 07/08/2021   HCT 42.2 07/08/2021   MCV 88 07/08/2021   PLT 231 07/08/2021    Attestation Statements:   Reviewed by clinician on day of visit: allergies, medications, problem list, medical history, surgical history, family history, social history, and previous encounter notes.  I, Kathlene November, BS, CMA, am acting as transcriptionist for [employer], DO.  I have reviewed the above documentation for accuracy and completeness, and I agree with the above. Marjory Sneddon, D.O.  The [place] was signed into law in 2016 which includes the topic of electronic health records.  This provides immediate access to information in MyChart.  This includes consultation notes, operative notes, office notes, lab results and pathology reports.  If you have any questions about what you read please let us know at your next visit so we  can discuss your concerns and take corrective action if need be.  We are right here with you.

## 2021-12-21 ENCOUNTER — Ambulatory Visit (INDEPENDENT_AMBULATORY_CARE_PROVIDER_SITE_OTHER): Payer: Managed Care, Other (non HMO) | Admitting: Family Medicine

## 2021-12-21 ENCOUNTER — Encounter (INDEPENDENT_AMBULATORY_CARE_PROVIDER_SITE_OTHER): Payer: Self-pay | Admitting: Family Medicine

## 2021-12-21 ENCOUNTER — Other Ambulatory Visit (INDEPENDENT_AMBULATORY_CARE_PROVIDER_SITE_OTHER): Payer: Self-pay | Admitting: Family Medicine

## 2021-12-21 VITALS — BP 142/84 | HR 63 | Temp 98.0°F | Ht 62.0 in | Wt 184.0 lb

## 2021-12-21 DIAGNOSIS — E8881 Metabolic syndrome: Secondary | ICD-10-CM

## 2021-12-21 DIAGNOSIS — E669 Obesity, unspecified: Secondary | ICD-10-CM

## 2021-12-21 DIAGNOSIS — H539 Unspecified visual disturbance: Secondary | ICD-10-CM | POA: Diagnosis not present

## 2021-12-21 DIAGNOSIS — Z9189 Other specified personal risk factors, not elsewhere classified: Secondary | ICD-10-CM

## 2021-12-21 DIAGNOSIS — E538 Deficiency of other specified B group vitamins: Secondary | ICD-10-CM

## 2021-12-21 DIAGNOSIS — Z6833 Body mass index (BMI) 33.0-33.9, adult: Secondary | ICD-10-CM

## 2021-12-21 DIAGNOSIS — E559 Vitamin D deficiency, unspecified: Secondary | ICD-10-CM | POA: Diagnosis not present

## 2021-12-21 MED ORDER — METFORMIN HCL 500 MG PO TABS: ORAL_TABLET | ORAL | 0 refills | Status: DC

## 2021-12-21 MED ORDER — VITAMIN D (ERGOCALCIFEROL) 1.25 MG (50000 UNIT) PO CAPS: 50000.0000 [IU] | ORAL_CAPSULE | ORAL | 0 refills | Status: DC

## 2021-12-22 LAB — COMPREHENSIVE METABOLIC PANEL
ALT: 36 IU/L — ABNORMAL HIGH (ref 0–32)
AST: 25 IU/L (ref 0–40)
Albumin/Globulin Ratio: 1.6 (ref 1.2–2.2)
Albumin: 4.6 g/dL (ref 3.8–4.9)
Alkaline Phosphatase: 59 IU/L (ref 44–121)
BUN/Creatinine Ratio: 17 (ref 12–28)
BUN: 14 mg/dL (ref 8–27)
Bilirubin Total: 0.4 mg/dL (ref 0.0–1.2)
CO2: 22 mmol/L (ref 20–29)
Calcium: 9.6 mg/dL (ref 8.7–10.3)
Chloride: 105 mmol/L (ref 96–106)
Creatinine, Ser: 0.83 mg/dL (ref 0.57–1.00)
Globulin, Total: 2.9 g/dL (ref 1.5–4.5)
Glucose: 101 mg/dL — ABNORMAL HIGH (ref 70–99)
Potassium: 4.1 mmol/L (ref 3.5–5.2)
Sodium: 142 mmol/L (ref 134–144)
Total Protein: 7.5 g/dL (ref 6.0–8.5)
eGFR: 81 mL/min/{1.73_m2} (ref >59)

## 2021-12-22 LAB — LIPID PANEL WITH LDL/HDL RATIO
Cholesterol, Total: 238 mg/dL — ABNORMAL HIGH (ref 100–199)
HDL: 41 mg/dL (ref >39)
LDL Chol Calc (NIH): 161 mg/dL — ABNORMAL HIGH (ref 0–99)
LDL/HDL Ratio: 3.9 ratio — ABNORMAL HIGH (ref 0.0–3.2)
Triglycerides: 197 mg/dL — ABNORMAL HIGH (ref 0–149)
VLDL Cholesterol Cal: 36 mg/dL (ref 5–40)

## 2021-12-22 LAB — VITAMIN B12: Vitamin B-12: 837 pg/mL (ref 232–1245)

## 2021-12-22 LAB — VITAMIN D 25 HYDROXY (VIT D DEFICIENCY, FRACTURES): Vit D, 25-Hydroxy: 40.1 ng/mL (ref 30.0–100.0)

## 2021-12-22 LAB — HEMOGLOBIN A1C
Est. average glucose Bld gHb Est-mCnc: 111 mg/dL
Hgb A1c MFr Bld: 5.5 % (ref 4.8–5.6)

## 2021-12-22 LAB — INSULIN, RANDOM: INSULIN: 46.2 u[IU]/mL — ABNORMAL HIGH (ref 2.6–24.9)

## 2021-12-25 NOTE — Progress Notes (Unsigned)
Chief Complaint:   OBESITY Andrea Jones is here to discuss her progress with her obesity treatment plan along with follow-up of her obesity related diagnoses. Andrea Jones is on keeping a food journal and adhering to recommended goals of 1400-1500 calories and 100+ protein and states she is following her eating plan approximately 70% of the time. Andrea Jones states she is not exercising.  Today's visit was #: 24 Starting weight: 183 lbs Starting date: 06/19/2021 Today's weight: 184 lbs Today's date: 12/21/2021 Total lbs lost to date: 0 Total lbs lost since last in-office visit: 0  Interim History: Andrea [place] for one week.  She did journal but forgot papers.  Seeing that it really helped her see what she needs to focus on.  She learned that her calories are high and protein grams are too low.    Subjective:   1. Insulin resistance Has not been taking Metformin but did think that it helped.   2. B12 deficiency Last level checked >2,000, in February 2023.  Andrea Jones stopped all supplements, she is here today for a recheck.  3. Vision abnormalities Andrea Jones is seeing a retinal specialist, vision has been deteriorating lately. It has been difficult for her to see notes on sheets while playing the piano and even when driving.  4. Vitamin D deficiency She is currently taking prescription vitamin D 50,000 IU each week. She denies nausea, vomiting or muscle weakness.  Medication compliance is good.   5. At risk for activity intolerance Andrea Jones is at risk of exercise intolerance due to obesity.   Assessment/Plan:   Orders Placed This Encounter  Procedures   Vitamin B12   Hemoglobin A1c   Insulin, random   Comprehensive metabolic panel   VITAMIN D 25 Hydroxy (Vit-D Deficiency, Fractures)   Lipid Panel With LDL/HDL Ratio    Medications Discontinued During This Encounter  Medication Reason   metFORMIN (GLUCOPHAGE) 500 MG tablet Reorder   Vitamin D,  Ergocalciferol, (DRISDOL) 1.25 MG (50000 UNIT) CAPS capsule Reorder     Meds ordered this encounter  Medications   Vitamin D, Ergocalciferol, (DRISDOL) 1.25 MG (50000 UNIT) CAPS capsule    Sig: Take 1 capsule (50,000 Units total) by mouth every 7 (seven) days.    Dispense:  4 capsule    Refill:  0    Ov for RF   metFORMIN (GLUCOPHAGE) 500 MG tablet    Sig: '500mg'$  po daily with lunch    Dispense:  30 tablet    Refill:  0    30 d supply;  ** OV for RF **   Do not send RF request     1. Insulin resistance Andrea Jones will continue to work on weight loss, exercise, and decreasing simple carbohydrates to help decrease the risk of diabetes. Andrea Jones agreed to follow-up with [name] as directed to closely monitor her progress.  Handouts given on insulin resistance, prediabetes, and metformin again.   Refill - metFORMIN (GLUCOPHAGE) 500 MG tablet; '500mg'$  po daily with lunch  Dispense: 30 tablet; Refill: 0  Check labs today  - Hemoglobin A1c - Insulin, random - Comprehensive metabolic panel - Lipid Panel With LDL/HDL Ratio  2. B12 deficiency Check labs today. Continue to hold all supplements.  - Vitamin B12  3. Vision abnormalities Follow up with retinal specialist for further care.  4. Vitamin D deficiency Low Vitamin D level contributes to fatigue and are associated with obesity, breast, and colon cancer. She agrees to continue to  take prescription Vitamin D '@50'$ ,000 IU every week and will follow-up for routine testing of Vitamin D, at least 2-3 times per year to avoid over-replacement.  Refill - Vitamin D, Ergocalciferol, (DRISDOL) 1.25 MG (50000 UNIT) CAPS capsule; Take 1 capsule (50,000 Units total) by mouth every 7 (seven) days.  Dispense: 4 capsule; Refill: 0  Check labs today.  - VITAMIN D 25 Hydroxy (Vit-D Deficiency, Fractures)  5. At risk for activity intolerance Andrea Jones was given approximately 9 minutes of exercise intolerance counseling today. She is 61 y.o. female and has risk  factors exercise intolerance including obesity. We discussed intensive lifestyle modifications today with an emphasis on specific weight loss instructions and strategies. Andrea Jones will slowly increase activity as tolerated.  Repetitive spaced learning was employed today to elicit superior memory formation and behavioral change.   6. Obesity, Current BMI 33.7 Bring in journaling log to next office visit.  Andrea Jones is currently in the action stage of change. As such, her goal is to continue with weight loss efforts. She has agreed to keeping a food journal and adhering to recommended goals of 1400-1500 calories and 100+ protein.   Exercise goals: All adults should avoid inactivity. Some physical activity is better than none, and adults who participate in any amount of physical activity gain some health benefits. Walking in the mornings is best for Andrea Jones.   Behavioral modification strategies: increasing lean protein intake, decreasing simple carbohydrates, and keeping a strict food journal.  Andrea Jones has agreed to follow-up with our clinic in 4 weeks. She was informed of the importance of frequent follow-up visits to maximize her success with intensive lifestyle modifications for her multiple health conditions.   Objective:   Blood pressure (!) 142/84, pulse 63, temperature 98 F (36.7 C), height '5\' 2"'$  (1.575 m), weight 184 lb (83.5 kg), SpO2 95 %. Body mass index is 33.65 kg/m.  General: Cooperative, alert, well developed, in no acute distress. HEENT: Conjunctivae and lids unremarkable. Cardiovascular: Regular rhythm.  Lungs: Normal work of breathing. Neurologic: No focal deficits.   Lab Results  Component Value Date   CREATININE 0.83 12/21/2021   BUN 14 12/21/2021   NA 142 12/21/2021   K 4.1 12/21/2021   CL 105 12/21/2021   CO2 22 12/21/2021   Lab Results  Component Value Date   ALT 36 (H) 12/21/2021   AST 25 12/21/2021   ALKPHOS 59 12/21/2021   BILITOT 0.4 12/21/2021   Lab  Results  Component Value Date   HGBA1C 5.5 12/21/2021   HGBA1C 5.4 07/08/2021   HGBA1C 5.2 03/09/2021   HGBA1C 5.5 06/09/2020   HGBA1C 5.5 02/25/2019   Lab Results  Component Value Date   INSULIN 46.2 (H) 12/21/2021   INSULIN 51.7 (H) 07/08/2021   INSULIN 23.3 03/09/2021   INSULIN 50.6 (H) 12/18/2020   INSULIN 21.1 06/09/2020   Lab Results  Component Value Date   TSH 2.310 07/08/2021   Lab Results  Component Value Date   CHOL 238 (H) 12/21/2021   HDL 41 12/21/2021   LDLCALC 161 (H) 12/21/2021   TRIG 197 (H) 12/21/2021   CHOLHDL 3.8 07/08/2021   Lab Results  Component Value Date   VD25OH 40.1 12/21/2021   VD25OH 55.4 07/08/2021   VD25OH 54.0 03/09/2021   Lab Results  Component Value Date   WBC 6.4 07/08/2021   HGB 14.4 07/08/2021   HCT 42.2 07/08/2021   MCV 88 07/08/2021   PLT 231 07/08/2021   No results found for: "IRON", "TIBC", "FERRITIN"  Andrea Jones was informed we would discuss her lab results at her next visit unless there is a critical issue that needs to be addressed sooner. Andrea Jones agreed to keep her next visit at the agreed upon time to discuss these results.  Attestation Statements:   Reviewed by clinician on day of visit: allergies, medications, problem list, medical history, surgical history, family history, social history, and previous encounter notes  I, Davy Pique, RMA, am acting as Location manager for [employer], DO.  I I have reviewed the above documentation for accuracy and completeness, and I agree with the above. Marjory Sneddon, D.O.  The [place] was signed into law in 2016 which includes the topic of electronic health records.  This provides immediate access to information in MyChart.  This includes consultation notes, operative notes, office notes, lab results and pathology reports.  If you have any questions about what you read please let us know at your next visit so we can discuss your concerns and take corrective  action if need be.  We are right here with you.

## 2022-01-12 ENCOUNTER — Encounter (INDEPENDENT_AMBULATORY_CARE_PROVIDER_SITE_OTHER): Payer: Self-pay

## 2022-01-19 ENCOUNTER — Ambulatory Visit (INDEPENDENT_AMBULATORY_CARE_PROVIDER_SITE_OTHER): Payer: Managed Care, Other (non HMO) | Admitting: Family Medicine

## 2022-01-19 ENCOUNTER — Encounter (INDEPENDENT_AMBULATORY_CARE_PROVIDER_SITE_OTHER): Payer: Self-pay | Admitting: Family Medicine

## 2022-01-19 VITALS — BP 139/82 | HR 67 | Temp 97.9°F | Ht 62.0 in | Wt 187.0 lb

## 2022-01-19 DIAGNOSIS — E559 Vitamin D deficiency, unspecified: Secondary | ICD-10-CM

## 2022-01-19 DIAGNOSIS — E669 Obesity, unspecified: Secondary | ICD-10-CM

## 2022-01-19 DIAGNOSIS — E7849 Other hyperlipidemia: Secondary | ICD-10-CM

## 2022-01-19 DIAGNOSIS — E8881 Metabolic syndrome: Secondary | ICD-10-CM

## 2022-01-19 DIAGNOSIS — Z6834 Body mass index (BMI) 34.0-34.9, adult: Secondary | ICD-10-CM

## 2022-01-19 DIAGNOSIS — E538 Deficiency of other specified B group vitamins: Secondary | ICD-10-CM | POA: Diagnosis not present

## 2022-01-19 DIAGNOSIS — Z9189 Other specified personal risk factors, not elsewhere classified: Secondary | ICD-10-CM

## 2022-01-19 DIAGNOSIS — Z6833 Body mass index (BMI) 33.0-33.9, adult: Secondary | ICD-10-CM

## 2022-01-19 MED ORDER — VITAMIN D (ERGOCALCIFEROL) 1.25 MG (50000 UNIT) PO CAPS: 50000.0000 [IU] | ORAL_CAPSULE | ORAL | 0 refills | Status: DC

## 2022-01-25 NOTE — Progress Notes (Signed)
Chief Complaint:   OBESITY Andrea Jones is here to discuss her progress with her obesity treatment plan along with follow-up of her obesity related diagnoses. [name] is on keeping a food journal and adhering to recommended goals of 1400-1500 calories and 100+ grams protein and states she is following her eating plan approximately 70% of the time. [name] states she is walking 30 minutes 2 times per week.  Today's visit was #: 25 Starting weight: 183 lbs Starting date: 06/19/2021 Today's weight: 187 lbs Today's date: 01/19/2022 Total lbs lost to date: 0 Total lbs lost since last in-office visit: +3  Interim History: Pt's husband got sepsis from a UTI and was sent to the hospital. Pt has been under a lot of stress. Pt has been eating out more to make it easier on her. She hasn't been journaling. Labs drawn 12/21/2021 to be reviewed today.  Subjective:   1. Insulin resistance Worsening. Discussed labs with patient today. Andrea Jones is still not taking Metformin regularly.  2. Other hyperlipidemia Worsening. Discussed labs with patient today. Andrea Jones was started on Crestor 7/22 but has not been taking it much at all. Her LDL went from 109 to 161 and triglycerides went from 88 to 197.  3. B12 deficiency Discussed labs with patient today. Pt is not on any supplements for B12.  4. Vitamin D deficiency Worsening. Discussed labs with patient today. She is not taking supplement regularly but does take it sometimes.  5. At risk for heart disease Andrea Jones is at risk for heart disease due to worsening labs.  Assessment/Plan:  No orders of the defined types were placed in this encounter.   Medications Discontinued During This Encounter  Medication Reason   Vitamin D, Ergocalciferol, (DRISDOL) 1.25 MG (50000 UNIT) CAPS capsule Reorder     Meds ordered this encounter  Medications   Vitamin D, Ergocalciferol, (DRISDOL) 1.25 MG (50000 UNIT) CAPS capsule    Sig: Take 1 capsule (50,000 Units  total) by mouth every 7 (seven) days.    Dispense:  4 capsule    Refill:  0    Ov for RF     1. Insulin resistance A1c is now back to 5.5 (which is where she was when she stated the program). Andrea Jones will continue to work on weight loss, exercise, and decreasing simple carbohydrates to help decrease the risk of diabetes. Andrea Jones agreed to follow-up with Andrea Jones as directed to closely monitor her progress. Pt's fasting insulin is much worse now at 46.2. she needs to take Metformin. No need for refill at this time.  2. Other hyperlipidemia Cardiovascular risk and specific lipid/LDL goals reviewed.  We discussed several lifestyle modifications today and Keyanni will continue to work on diet, exercise and weight loss efforts. Orders and follow up as documented in patient record. Counseling done. Medication compliance needs to be improved and strategies discussed with pt. No need for refills. Andrea Jones needs to take meds as prescribed.  Counseling Intensive lifestyle modifications are the first line treatment for this issue. Dietary changes: Increase soluble fiber. Decrease simple carbohydrates. Exercise changes: Moderate to vigorous-intensity aerobic activity 150 minutes per week if tolerated. Lipid-lowering medications: see documented in medical record.  3. B12 deficiency B12 at goal. The diagnosis was reviewed with the patient. Counseling provided today, see below. We will continue to monitor. Orders and follow up as documented in patient record. No need for supplementation.  Counseling The body needs vitamin B12: to make red blood cells; to make DNA; and to help  the nerves work properly so they can carry messages from the brain to the body.  The main causes of vitamin B12 deficiency include dietary deficiency, digestive diseases, pernicious anemia, and having a surgery in which part of the stomach or small intestine is removed.  Certain medicines can make it harder for the body to absorb vitamin B12.  These medicines include: heartburn medications; some antibiotics; some medications used to treat diabetes, gout, and high cholesterol.  In some cases, there are no symptoms of this condition. If the condition leads to anemia or nerve damage, various symptoms can occur, such as weakness or fatigue, shortness of breath, and numbness or tingling in your hands and feet.   Treatment:  May include taking vitamin B12 supplements.  Avoid alcohol.  Eat lots of healthy foods that contain vitamin B12: Beef, pork, chicken, [place], and organ meats, such as liver.  Seafood: This includes clams, rainbow trout, salmon, tuna, and haddock. Eggs.  Cereal and dairy products that are fortified: This means that vitamin B12 has been added to the food.   4. Vitamin D deficiency Not at goal, even though it's the end of summer. Low Vitamin D level contributes to fatigue and are associated with obesity, breast, and colon cancer. She agrees to continue to take prescription Vitamin D '@50'$ ,000 IU every week and will follow-up for routine testing of Vitamin D, at least 2-3 times per year to avoid over-replacement.  Refill- Vitamin D, Ergocalciferol, (DRISDOL) 1.25 MG (50000 UNIT) CAPS capsule; Take 1 capsule (50,000 Units total) by mouth every 7 (seven) days.  Dispense: 4 capsule; Refill: 0  5. At risk for heart disease Due to Andrea Jones's current state of health and medical condition(s), she is at a higher risk for heart disease.  This puts the patient at much greater risk to subsequently develop cardiopulmonary conditions that can significantly affect patient's quality of life in a negative manner as well.    At least 24 minutes were spent on counseling Andrea Jones about these concerns today, and we discussed the importance of reversing risks factors of obesity, especially truncal and visceral fat, hypertension, hyperlipidemia, and pre-diabetes.  The initial goal is to lose at least 5-10% of starting weight to  help reduce these risk factors.  Counseling: Intensive lifestyle modifications were discussed with Andrea Dane Shader as the most appropriate first line of treatment.  she will continue to work on diet, exercise, and weight loss efforts.  We will continue to reassess these conditions on a fairly regular basis in an attempt to decrease the patient's overall morbidity and mortality.  Evidence-based interventions for health behavior change were utilized today including the discussion of self monitoring techniques, problem-solving barriers, and SMART goal setting techniques.  Specifically, regarding patient's less desirable eating habits and patterns, we employed the technique of small changes when Esbeidy Mclaine Rybarczyk has not been able to fully commit to her prudent nutritional plan.  6. Obesity, Current BMI 34.2 Virgen is currently in the action stage of change. As such, her goal is to continue with weight loss efforts. She has agreed to keeping a food journal and adhering to recommended goals of 1400-1500 calories and 100+ grams protein.   Pt's goal is to meal plan and prep more. She will plan on Sundays and buy foods.  Exercise goals:  As is  Behavioral modification strategies: meal planning and cooking strategies and planning for success.  Merlyn has agreed to follow-up with our clinic in 4-5 weeks. She was  informed of the importance of frequent follow-up visits to maximize her success with intensive lifestyle modifications for her multiple health conditions.   Objective:   Blood pressure 139/82, pulse 67, temperature 97.9 F (36.6 C), height '5\' 2"'$  (1.575 m), weight 187 lb (84.8 kg), SpO2 95 %. Body mass index is 34.2 kg/m.  General: Cooperative, alert, well developed, in no acute distress. HEENT: Conjunctivae and lids unremarkable. Cardiovascular: Regular rhythm.  Lungs: Normal work of breathing. Neurologic: No focal deficits.   Lab Results  Component Value Date   CREATININE 0.83  12/21/2021   BUN 14 12/21/2021   NA 142 12/21/2021   K 4.1 12/21/2021   CL 105 12/21/2021   CO2 22 12/21/2021   Lab Results  Component Value Date   ALT 36 (H) 12/21/2021   AST 25 12/21/2021   ALKPHOS 59 12/21/2021   BILITOT 0.4 12/21/2021   Lab Results  Component Value Date   HGBA1C 5.5 12/21/2021   HGBA1C 5.4 07/08/2021   HGBA1C 5.2 03/09/2021   HGBA1C 5.5 06/09/2020   HGBA1C 5.5 02/25/2019   Lab Results  Component Value Date   INSULIN 46.2 (H) 12/21/2021   INSULIN 51.7 (H) 07/08/2021   INSULIN 23.3 03/09/2021   INSULIN 50.6 (H) 12/18/2020   INSULIN 21.1 06/09/2020   Lab Results  Component Value Date   TSH 2.310 07/08/2021   Lab Results  Component Value Date   CHOL 238 (H) 12/21/2021   HDL 41 12/21/2021   LDLCALC 161 (H) 12/21/2021   TRIG 197 (H) 12/21/2021   CHOLHDL 3.8 07/08/2021   Lab Results  Component Value Date   VD25OH 40.1 12/21/2021   VD25OH 55.4 07/08/2021   VD25OH 54.0 03/09/2021   Lab Results  Component Value Date   WBC 6.4 07/08/2021   HGB 14.4 07/08/2021   HCT 42.2 07/08/2021   MCV 88 07/08/2021   PLT 231 07/08/2021     Attestation Statements:   Reviewed by clinician on day of visit: allergies, medications, problem list, medical history, surgical history, family history, social history, and previous encounter notes.  I, Kathlene November, BS, CMA, am acting as transcriptionist for [employer], DO.   I have reviewed the above documentation for accuracy and completeness, and I agree with the above. Marjory Sneddon, D.O.  The [place] was signed into law in 2016 which includes the topic of electronic health records.  This provides immediate access to information in MyChart.  This includes consultation notes, operative notes, office notes, lab results and pathology reports.  If you have any questions about what you read please let Andrea Jones know at your next visit so we can discuss your concerns and take corrective action if  need be.  We are right here with you.

## 2022-02-23 ENCOUNTER — Encounter (INDEPENDENT_AMBULATORY_CARE_PROVIDER_SITE_OTHER): Payer: Self-pay | Admitting: Family Medicine

## 2022-02-23 ENCOUNTER — Telehealth (INDEPENDENT_AMBULATORY_CARE_PROVIDER_SITE_OTHER): Payer: Managed Care, Other (non HMO) | Admitting: Family Medicine

## 2022-02-23 VITALS — BP 119/76 | Ht 62.0 in | Wt 184.0 lb

## 2022-02-23 DIAGNOSIS — E559 Vitamin D deficiency, unspecified: Secondary | ICD-10-CM | POA: Diagnosis not present

## 2022-02-23 DIAGNOSIS — E8881 Metabolic syndrome: Secondary | ICD-10-CM | POA: Diagnosis not present

## 2022-02-23 DIAGNOSIS — Z6833 Body mass index (BMI) 33.0-33.9, adult: Secondary | ICD-10-CM | POA: Diagnosis not present

## 2022-02-23 DIAGNOSIS — E669 Obesity, unspecified: Secondary | ICD-10-CM

## 2022-02-23 MED ORDER — METFORMIN HCL 500 MG PO TABS: ORAL_TABLET | ORAL | 0 refills | Status: DC

## 2022-02-23 MED ORDER — VITAMIN D (ERGOCALCIFEROL) 1.25 MG (50000 UNIT) PO CAPS: 50000.0000 [IU] | ORAL_CAPSULE | ORAL | 0 refills | Status: DC

## 2022-02-26 NOTE — Progress Notes (Unsigned)
TeleHealth Visit:  Due to the COVID-19 pandemic, this visit was completed with telemedicine (audio/video) technology to reduce patient and provider exposure as well as to preserve personal protective equipment.   [name] has verbally consented to this TeleHealth visit. The patient is located at home, the provider is located at the [facility] office. The participants in this visit include the listed provider and patient.  The visit was conducted today via Telehealth.  Chief Complaint: OBESITY Andrea Jones is here to discuss her progress with her obesity treatment plan along with follow-up of her obesity related diagnoses. Andrea Jones is on keeping a food journal and adhering to recommended goals of 1500 calories and 100 grams of protein daily and states she is following her eating plan approximately 70% of the time. Andrea Jones states she is not currently exercising.  Today's visit was #: 26 Starting weight: 183 lbs Starting date: 06/19/2020  Interim History: Andrea Jones had recent eye surgery for "my eye bubble". She has been on her meal plan and thinks she lost 3 lbs. She can not exercise or lift above 10 lbs. Her calories are just over [phone] and not hitting 40-50 grams of protein because she is not hungry.  Subjective:   1. Insulin resistance Andrea Jones has been more regular with taking her Metformin. She reports no hunger at all - can not eat everything. Andrea Jones reports no sweet cravings when taking Metformin as well as not having side effects.  2. Vitamin D deficiency She is currently taking prescription vitamin D 50,000 IU each week. She denies nausea, vomiting or muscle weakness.  Lab Results  Component Value Date   VD25OH 40.1 12/21/2021   VD25OH 55.4 07/08/2021   VD25OH 54.0 03/09/2021    Assessment/Plan:  No orders of the defined types were placed in this encounter.   Medications Discontinued During This Encounter  Medication Reason   metFORMIN (GLUCOPHAGE) 500 MG tablet  Reorder   Vitamin D, Ergocalciferol, (DRISDOL) 1.25 MG (50000 UNIT) CAPS capsule Reorder     Meds ordered this encounter  Medications   Vitamin D, Ergocalciferol, (DRISDOL) 1.25 MG (50000 UNIT) CAPS capsule    Sig: Take 1 capsule (50,000 Units total) by mouth every 7 (seven) days.    Dispense:  4 capsule    Refill:  0    Ov for RF   metFORMIN (GLUCOPHAGE) 500 MG tablet    Sig: '500mg'$  po daily with lunch    Dispense:  30 tablet    Refill:  0    30 d supply;  ** OV for RF **   Do not send RF request     1. Insulin resistance Andrea Jones will continue to work on weight loss, exercise, and decreasing simple carbohydrates to help decrease the risk of diabetes. Andrea Jones agreed to follow-up with [name] as directed to closely monitor her progress. Will refill Metformin as follows: - metFORMIN (GLUCOPHAGE) 500 MG tablet; '500mg'$  po daily with lunch  Dispense: 30 tablet; Refill: 0  2. Vitamin D deficiency Low Vitamin D level contributes to fatigue and are associated with obesity, breast, and colon cancer. She agrees to continue to take prescription Vitamin D '@50'$ ,000 IU every week and will follow-up for routine testing of Vitamin D, at least 2-3 times per year to avoid over-replacement. Will refill Vitamin D as follows: - Vitamin D, Ergocalciferol, (DRISDOL) 1.25 MG (50000 UNIT) CAPS capsule; Take 1 capsule (50,000 Units total) by mouth every 7 (seven) days.  Dispense: 4 capsule; Refill: 0  3. Obesity,  Current BMI 33.6 [name] is currently in the action stage of change. As such, her goal is to continue with weight loss efforts. She has agreed to keeping a food journal and adhering to recommended goals of 1400-1500 calories and 100 grams of protein.   Exercise goals: As is per Ophthalmologist eye surgeon.  Behavioral modification strategies: planning for success and keeping a strict journal.  Andrea Jones has agreed to follow-up with our clinic in 4 weeks. She was informed of the importance of frequent follow-up  visits to maximize her success with intensive lifestyle modifications for her multiple health conditions.  Objective:   VITALS: Per patient if applicable, see vitals. GENERAL: Alert and in no acute distress. CARDIOPULMONARY: No increased WOB. Speaking in clear sentences.  PSYCH: Pleasant and cooperative. Speech normal rate and rhythm. Affect is appropriate. Insight and judgement are appropriate. Attention is focused, linear, and appropriate.  NEURO: Oriented as arrived to appointment on time with no prompting.   Lab Results  Component Value Date   CREATININE 0.83 12/21/2021   BUN 14 12/21/2021   NA 142 12/21/2021   K 4.1 12/21/2021   CL 105 12/21/2021   CO2 22 12/21/2021   Lab Results  Component Value Date   ALT 36 (H) 12/21/2021   AST 25 12/21/2021   ALKPHOS 59 12/21/2021   BILITOT 0.4 12/21/2021   Lab Results  Component Value Date   HGBA1C 5.5 12/21/2021   HGBA1C 5.4 07/08/2021   HGBA1C 5.2 03/09/2021   HGBA1C 5.5 06/09/2020   HGBA1C 5.5 02/25/2019   Lab Results  Component Value Date   INSULIN 46.2 (H) 12/21/2021   INSULIN 51.7 (H) 07/08/2021   INSULIN 23.3 03/09/2021   INSULIN 50.6 (H) 12/18/2020   INSULIN 21.1 06/09/2020   Lab Results  Component Value Date   TSH 2.310 07/08/2021   Lab Results  Component Value Date   CHOL 238 (H) 12/21/2021   HDL 41 12/21/2021   LDLCALC 161 (H) 12/21/2021   TRIG 197 (H) 12/21/2021   CHOLHDL 3.8 07/08/2021   Lab Results  Component Value Date   VD25OH 40.1 12/21/2021   VD25OH 55.4 07/08/2021   VD25OH 54.0 03/09/2021   Lab Results  Component Value Date   WBC 6.4 07/08/2021   HGB 14.4 07/08/2021   HCT 42.2 07/08/2021   MCV 88 07/08/2021   PLT 231 07/08/2021   No results found for: "IRON", "TIBC", "FERRITIN"  Attestation Statements:   Reviewed by clinician on day of visit: allergies, medications, problem list, medical history, surgical history, family history, social history, and previous encounter notes.  ILennette Bihari, CMA, am acting as transcriptionist for Dr. Raliegh Scarlet, DO.    I have reviewed the above documentation for accuracy and completeness, and I agree with the above. Marjory Sneddon, D.O.  The [place] was signed into law in 2016 which includes the topic of electronic health records.  This provides immediate access to information in MyChart.  This includes consultation notes, operative notes, office notes, lab results and pathology reports.  If you have any questions about what you read please let us know at your next visit so we can discuss your concerns and take corrective action if need be.  We are right here with you.

## 2022-05-05 ENCOUNTER — Encounter: Payer: Self-pay | Admitting: Oncology

## 2022-05-05 ENCOUNTER — Inpatient Hospital Stay: Payer: Managed Care, Other (non HMO) | Attending: Oncology | Admitting: Oncology

## 2022-05-05 VITALS — BP 147/82 | HR 74 | Temp 97.6°F | Wt 191.0 lb

## 2022-05-05 DIAGNOSIS — Z8041 Family history of malignant neoplasm of ovary: Secondary | ICD-10-CM | POA: Diagnosis not present

## 2022-05-05 DIAGNOSIS — E559 Vitamin D deficiency, unspecified: Secondary | ICD-10-CM | POA: Insufficient documentation

## 2022-05-05 DIAGNOSIS — R011 Cardiac murmur, unspecified: Secondary | ICD-10-CM | POA: Insufficient documentation

## 2022-05-05 DIAGNOSIS — D472 Monoclonal gammopathy: Secondary | ICD-10-CM | POA: Diagnosis not present

## 2022-05-05 DIAGNOSIS — Z79899 Other long term (current) drug therapy: Secondary | ICD-10-CM | POA: Diagnosis not present

## 2022-05-05 DIAGNOSIS — N6489 Other specified disorders of breast: Secondary | ICD-10-CM

## 2022-05-05 DIAGNOSIS — Z7984 Long term (current) use of oral hypoglycemic drugs: Secondary | ICD-10-CM | POA: Insufficient documentation

## 2022-05-05 DIAGNOSIS — E785 Hyperlipidemia, unspecified: Secondary | ICD-10-CM | POA: Diagnosis not present

## 2022-05-05 DIAGNOSIS — Z803 Family history of malignant neoplasm of breast: Secondary | ICD-10-CM | POA: Diagnosis not present

## 2022-05-05 DIAGNOSIS — R232 Flushing: Secondary | ICD-10-CM | POA: Diagnosis not present

## 2022-05-05 DIAGNOSIS — E7800 Pure hypercholesterolemia, unspecified: Secondary | ICD-10-CM | POA: Diagnosis not present

## 2022-05-05 NOTE — Assessment & Plan Note (Signed)
#   IgG MGUS [facility] results were reviewed and discussed with patient Stable kidney function and no anemia. M protein 1.1, light chain ratio increased to 3.94. recent testing results are pending.  Continue observation every 6 months. [facility] prescription was provided.

## 2022-05-05 NOTE — Progress Notes (Addendum)
Hematology/Oncology follow up  note [phone] [phone]   Patient Care Team: Leonel Ramsay, MD as PCP - General (Infectious Diseases) [name], MD as PCP - OBGYN (Obstetrics and Gynecology) Ammie Dalton, Okey Regal, MD (Unknown Physician Specialty) Bary Castilla, Forest Gleason, MD (General Surgery) Earlie Server, MD as Consulting Physician (Oncology)  ASSESSMENT & PLAN:   MGUS (monoclonal gammopathy of unknown significance) # IgG MGUS [facility] results were reviewed and discussed with patient Stable kidney function and no anemia. M protein 1.1, light chain ratio increased to 3.94. recent testing results are pending.  Continue observation every 6 months. [facility] prescription was provided.  Radial scar of breast #Radial scar/complex sclerosing lesion, status post resection.  Genetic testing negative.  Continue annual mammogram, patient gets image ordered via primary care provider's office.  Recent mammogram 10/06/2021 is negative for malignancy   Follow up in 6 months.  All questions were answered. The patient knows to call the clinic with any problems, questions or concerns.  Earlie Server, MD, PhD [facility] Hematology Oncology 05/05/2022    CHIEF COMPLAINTS/REASON FOR VISIT:  Follow-up for MGUS  HISTORY OF PRESENTING ILLNESS:  Andrea Jones is a 61 y.o. female presents for follow-up of MGUS.  neurology work-up for foot numbness and tingling.Marland Kitchen  SPEP showed 0.9 g/dL M spike,   , and IFE showed IgG Kappa monoclonal protein.  No aggravating or alleviated factors.  Associated signs or symptoms:  Neuropathy: Toe numbness and tingling. Denies weight loss, fever chills, bone pain, Bone pain: Denies Anemia denies  Patient also recently had an abnormal screening mammogram on 02/22/2019 and the patient was called back to perform unilateral left diagnostic breast mammogram on 03/06/2019 Showed irregular hypoechoic mass 0.6 x 0.4 x 0.7 cm in the left breast is  suspicious for malignancy.  Axillary lymph node status was not commented on mammogram or targeted ultrasound. Patient is status post left breast mass biopsy-03/15/2019, biopsy showed radial scar/complex sclerosing lesion.  # 03/15/2019 left breast biopsy showed radial scar/complex sclerosing lesion # Radial scar/complex sclerosing lesion, status post resection.  Family history of ovarian cancer and breast cancer.  Patient has met genetic counselor and genetic testing is negative.  Pregnancy / Birth History Age at menarche   38 years. Age of menopause   63-50 Gravida   3 Maternal age   59-25 Para   58  INTERVAL HISTORY Andrea Jones is a 61 y.o. female who has above history reviewed by me today presents for follow up visit for management of MGUS, history of radial scar Patient had a blood work done at [facility].  She has no new complaints      Review of Systems  Constitutional:  Negative for appetite change, chills, fatigue and fever.  HENT:   Negative for hearing loss and voice change.   Eyes:  Negative for eye problems.  Respiratory:  Negative for chest tightness and cough.   Cardiovascular:  Negative for chest pain.  Gastrointestinal:  Negative for abdominal distention, abdominal pain and blood in stool.  Endocrine: Negative for hot flashes.  Genitourinary:  Negative for difficulty urinating and frequency.   Musculoskeletal:  Negative for arthralgias.  Skin:  Negative for itching and rash.  Neurological:  Negative for extremity weakness.  Hematological:  Negative for adenopathy.  Psychiatric/Behavioral:  Negative for confusion.      MEDICAL HISTORY:  Past Medical History:  Diagnosis Date   Anemia    Anxiety    Chest pain    Constipation  Family history of breast cancer    Family history of colon cancer    Family history of ovarian cancer    8/19 and 9/21 cancer genetic testing letter sent   Family history of ovarian cancer    Heart murmur    hx of per pt,  "hasn't presented itself in years"   Heartburn    High blood pressure    Hot flashes    "pretty much gone"   Hypercholesteremia    Hyperlipidemia    Insomnia    Joint pain    Liver problem    MGUS (monoclonal gammopathy of unknown significance)    sees Dr. Tasia Catchings with hematology   MGUS (monoclonal gammopathy of unknown significance)    Nerve pain    in hands and feet   Palpitations    SOB (shortness of breath) on exertion    Unspecified lump in the left breast, unspecified quadrant    surgery scheduled for 07/15/20   Vitamin D deficiency 2016    SURGICAL HISTORY: Past Surgical History:  Procedure Laterality Date   ABDOMINAL HYSTERECTOMY  2001   APPENDECTOMY  1982   BREAST BIOPSY Left 1999   cyst,benign   BREAST BIOPSY Left 03/15/2019   [name] bx venus clip radial scar   BREAST LUMPECTOMY WITH RADIOACTIVE SEED LOCALIZATION Left 07/15/2020   Procedure: LEFT BREAST LUMPECTOMY WITH RADIOACTIVE SEED LOCALIZATION;  Surgeon: Jovita Kussmaul, MD;  Location: [place];  Service: General;  Laterality: Left;   [place]   COLONOSCOPY  06/13/2012   [place]. Normal Exam.   COLONOSCOPY WITH PROPOFOL N/A 10/26/2021   Procedure: COLONOSCOPY WITH PROPOFOL;  Surgeon: Jonathon Bellows, MD;  Location[facility] ENDOSCOPY;  Service: Gastroenterology;  Laterality: N/A;   HYSTERECTOMY ABDOMINAL WITH SALPINGECTOMY     mgus     OOPHORECTOMY     OVARY SURGERY  2000    SOCIAL HISTORY: Social History   Socioeconomic History   Marital status: Married    Spouse name: Not on file   Number of children: 3   Years of education: Not on file   Highest education level: Not on file  Occupational History   Occupation: Civil engineer, contracting  Tobacco Use   Smoking status: Never   Smokeless tobacco: Never  Vaping Use   Vaping Use: Never used  Substance and Sexual Activity   Alcohol use: No    Alcohol/week: 0.0 standard drinks of alcohol   Drug use: No   Sexual activity: Not Currently   Other Topics Concern   Not on file  Social History Narrative   Lives at home with husband    Right handed   Caffeine: maybe 1 cup maybe 3 days a week   Social Determinants of Radio broadcast assistant Strain: Not on file  Food Insecurity: Not on file  Transportation Needs: Not on file  Physical Activity: Not on file  Stress: Not on file  Social Connections: Not on file  Intimate Partner Violence: Not on file    FAMILY HISTORY: Family History  Problem Relation Age of Onset   Colon cancer Father 15   Heart disease Father    Cancer Father    Alcoholism Father    Congestive Heart Failure Mother    Emphysema Mother    Hypertension Mother    Kidney failure Mother    Neuropathy Mother    Hyperlipidemia Mother    Depression Mother    Anxiety disorder Mother    Obesity Mother  Ovarian cancer Sister 73       pat 1/2 sister   Breast cancer Cousin 36   Breast cancer Cousin     ALLERGIES:  has No Known Allergies.  MEDICATIONS:  Current Outpatient Medications  Medication Sig Dispense Refill   amLODipine (NORVASC) 10 MG tablet Take 1 tablet by mouth daily.     Apple Cid Vn-Grn Tea-Bit Or-Cr (APPLE CIDER VINEGAR PLUS PO) Take by mouth.     Biotin 10000 MCG TABS Take 1 each by mouth daily.     cholecalciferol (VITAMIN D3) 25 MCG (1000 UNIT) tablet Take 1,000 Units by mouth daily.     Cinnamon 500 MG TABS Take by mouth.     Multiple Vitamins-Minerals (WOMENS MULTIVITAMIN PO) Take by mouth.     Omega-3 Fatty Acids (OMEGA 3 PO) Take 1,200 mg by mouth daily.     rosuvastatin (CRESTOR) 10 MG tablet Take 1 tablet (10 mg total) by mouth at bedtime. 30 tablet 0   vitamin E 1000 UNIT capsule Take 1,000 Units by mouth daily.     levocetirizine (XYZAL) 5 MG tablet Take 1 tablet (5 mg total) by mouth every evening. (Patient not taking: Reported on 05/05/2022) 30 tablet 0   metFORMIN (GLUCOPHAGE) 500 MG tablet 539m po daily with lunch (Patient not taking: Reported on 05/05/2022) 30  tablet 0   No current facility-administered medications for this visit.     PHYSICAL EXAMINATION: ECOG PERFORMANCE STATUS: 0 - Asymptomatic Vitals:   05/05/22 1017  BP: (!) 147/82  Pulse: 74  Temp: 97.6 F (36.4 C)  SpO2: 97%   Filed Weights   05/05/22 1017  Weight: 191 lb (86.6 kg)    Physical Exam Constitutional:      General: She is not in acute distress. HENT:     Head: Normocephalic and atraumatic.  Eyes:     General: No scleral icterus.    Pupils: Pupils are equal, round, and reactive to light.  Cardiovascular:     Rate and Rhythm: Normal rate and regular rhythm.     Heart sounds: Normal heart sounds.  Pulmonary:     Effort: Pulmonary effort is normal. No respiratory distress.     Breath sounds: No wheezing.  Abdominal:     General: Bowel sounds are normal. There is no distension.     Palpations: Abdomen is soft. There is no mass.     Tenderness: There is no abdominal tenderness.  Musculoskeletal:        General: No deformity. Normal range of motion.     Cervical back: Normal range of motion and neck supple.  Skin:    General: Skin is warm and dry.     Findings: No erythema or rash.  Neurological:     Mental Status: She is alert and oriented to person, place, and time.     Cranial Nerves: No cranial nerve deficit.     Coordination: Coordination normal.  Psychiatric:        Behavior: Behavior normal.        Thought Content: Thought content normal.      LABORATORY DATA:  I have reviewed the data as listed Lab Results  Component Value Date   WBC 6.4 07/08/2021   HGB 14.4 07/08/2021   HCT 42.2 07/08/2021   MCV 88 07/08/2021   PLT 231 07/08/2021   Recent Labs    07/08/21 0853 12/21/21 1548  NA 143 142  K 4.6 4.1  CL 105 105  CO2 24 22  GLUCOSE 97 101*  BUN 13 14  CREATININE 0.84 0.83  CALCIUM 9.7 9.6  PROT 7.4 7.5  ALBUMIN 4.7 4.6  AST 32 25  ALT 43* 36*  ALKPHOS 64 59  BILITOT 0.3 0.4    Iron/TIBC/Ferritin/ %Sat No results found  for: "IRON", "TIBC", "FERRITIN", "IRONPCTSAT"   09/27/19 M protein 0.9 04/01/2020 M protein 1.1 09/29/2020 M protein 1, IgG 1415, hemoglobin 14.2, creatinine 0.95 04/12/2021, M protein 0.9, IgG 1495, hemoglobin 14.7, creatinine 0.95, free light chain ratio 3.6. 10/18/2021, M protein 1.1, IgG 1532, free light ratio, 3.94, hemoglobin 14.1, creatinine 0.85. 24-hour urine protein electrophoresis showed no M protein. 05/04/22 M protein 1.2 light chain ratio 4.04  RADIOGRAPHIC STUDIES: I have personally reviewed the radiological images as listed and agreed with the findings in the report.  No results found.

## 2022-05-05 NOTE — Assessment & Plan Note (Signed)
#  Radial scar/complex sclerosing lesion, status post resection.  Genetic testing negative.  Continue annual mammogram, patient gets image ordered via primary care provider's office.  Recent mammogram 10/06/2021 is negative for malignancy

## 2022-05-06 ENCOUNTER — Encounter: Payer: Self-pay | Admitting: Oncology

## 2022-05-13 ENCOUNTER — Encounter: Payer: Self-pay | Admitting: Oncology

## 2022-10-24 ENCOUNTER — Other Ambulatory Visit: Payer: Self-pay | Admitting: Obstetrics & Gynecology

## 2022-10-24 DIAGNOSIS — Z1231 Encounter for screening mammogram for malignant neoplasm of breast: Secondary | ICD-10-CM

## 2022-11-03 ENCOUNTER — Inpatient Hospital Stay: Payer: Managed Care, Other (non HMO) | Admitting: Oncology

## 2022-11-03 NOTE — Assessment & Plan Note (Deleted)
#   IgG MGUS [facility] results were reviewed and discussed with patient Stable kidney function and no anemia. M protein 1.1, light chain ratio increased to 3.94. recent testing results are pending.  Continue observation every 6 months. [facility] prescription was provided. 

## 2022-11-09 NOTE — Anesthesia Preprocedure Evaluation (Addendum)
Anesthesia Evaluation  Patient identified by MRN, date of birth, ID band Patient awake    Reviewed: Allergy & Precautions, H&P , NPO status , Patient's Chart, lab work & pertinent test results  Airway Mallampati: IV  TM Distance: <3 FB Neck ROM: Full    Dental no notable dental hx.    Pulmonary neg pulmonary ROS   Pulmonary exam normal breath sounds clear to auscultation       Cardiovascular hypertension, negative cardio ROS Normal cardiovascular exam Rhythm:Regular Rate:Normal     Neuro/Psych  PSYCHIATRIC DISORDERS Anxiety      Neuromuscular disease negative neurological ROS  negative psych ROS   GI/Hepatic negative GI ROS, Neg liver ROS,,,  Endo/Other  negative endocrine ROS    Renal/GU negative Renal ROS  negative genitourinary   Musculoskeletal negative musculoskeletal ROS (+)    Abdominal   Peds negative pediatric ROS (+)  Hematology negative hematology ROS (+)   Anesthesia Other Findings Hyperlipidemia  Vitamin D deficiency Hypercholesteremia  Insomnia Hot flashes MGUS (monoclonal gammopathy of unknown significance)  Palpitations Chest pain   Hx vitrectomy elsewhere in April, do not see anesthesia record High blood pressure Joint pain  Heartburn Liver problem Constipation SOB (shortness of breath) on exertion MGUS (monoclonal gammopathy of unknown significance) Nerve pain      Reproductive/Obstetrics negative OB ROS                             Anesthesia Physical Anesthesia Plan  ASA: 2  Anesthesia Plan: MAC   Post-op Pain Management:    Induction: Intravenous  PONV Risk Score and Plan:   Airway Management Planned: Natural Airway and Nasal Cannula  Additional Equipment:   Intra-op Plan:   Post-operative Plan:   Informed Consent: I have reviewed the patients History and Physical, chart, labs and discussed the procedure including the risks, benefits and  alternatives for the proposed anesthesia with the patient or authorized representative who has indicated his/her understanding and acceptance.     Dental Advisory Given  Plan Discussed with: Anesthesiologist, CRNA and Surgeon  Anesthesia Plan Comments: (Patient consented for risks of anesthesia including but not limited to:  - adverse reactions to medications - damage to eyes, teeth, lips or other oral mucosa - nerve damage due to positioning  - sore throat or hoarseness - Damage to heart, brain, nerves, lungs, other parts of body or loss of life  Patient voiced understanding.)       Anesthesia Quick Evaluation

## 2022-11-10 ENCOUNTER — Ambulatory Visit
Admission: RE | Admit: 2022-11-10 | Discharge: 2022-11-10 | Disposition: A | Discharge status: Home / Self Care | Payer: Managed Care, Other (non HMO) | Source: Ambulatory Visit | Attending: Obstetrics & Gynecology | Admitting: Obstetrics & Gynecology

## 2022-11-10 DIAGNOSIS — Z1231 Encounter for screening mammogram for malignant neoplasm of breast: Secondary | ICD-10-CM | POA: Insufficient documentation

## 2022-11-14 ENCOUNTER — Other Ambulatory Visit: Payer: Self-pay | Admitting: Obstetrics & Gynecology

## 2022-11-14 DIAGNOSIS — R928 Other abnormal and inconclusive findings on diagnostic imaging of breast: Secondary | ICD-10-CM

## 2022-11-14 DIAGNOSIS — R921 Mammographic calcification found on diagnostic imaging of breast: Secondary | ICD-10-CM

## 2022-11-15 ENCOUNTER — Encounter: Payer: Self-pay | Admitting: Ophthalmology

## 2022-11-16 ENCOUNTER — Encounter: Payer: Self-pay | Admitting: Oncology

## 2022-11-16 ENCOUNTER — Inpatient Hospital Stay: Payer: Managed Care, Other (non HMO) | Attending: Oncology | Admitting: Oncology

## 2022-11-16 VITALS — BP 137/71 | HR 65 | Temp 97.6°F | Resp 18 | Wt 184.7 lb

## 2022-11-16 DIAGNOSIS — Z800 Family history of malignant neoplasm of digestive organs: Secondary | ICD-10-CM | POA: Insufficient documentation

## 2022-11-16 DIAGNOSIS — E785 Hyperlipidemia, unspecified: Secondary | ICD-10-CM | POA: Diagnosis not present

## 2022-11-16 DIAGNOSIS — Z8041 Family history of malignant neoplasm of ovary: Secondary | ICD-10-CM | POA: Diagnosis not present

## 2022-11-16 DIAGNOSIS — R200 Anesthesia of skin: Secondary | ICD-10-CM | POA: Diagnosis not present

## 2022-11-16 DIAGNOSIS — D472 Monoclonal gammopathy: Secondary | ICD-10-CM | POA: Insufficient documentation

## 2022-11-16 DIAGNOSIS — E559 Vitamin D deficiency, unspecified: Secondary | ICD-10-CM | POA: Diagnosis not present

## 2022-11-16 DIAGNOSIS — Z803 Family history of malignant neoplasm of breast: Secondary | ICD-10-CM | POA: Insufficient documentation

## 2022-11-16 DIAGNOSIS — N6489 Other specified disorders of breast: Secondary | ICD-10-CM | POA: Insufficient documentation

## 2022-11-16 DIAGNOSIS — R202 Paresthesia of skin: Secondary | ICD-10-CM | POA: Insufficient documentation

## 2022-11-16 DIAGNOSIS — E7800 Pure hypercholesterolemia, unspecified: Secondary | ICD-10-CM | POA: Diagnosis not present

## 2022-11-16 NOTE — Assessment & Plan Note (Signed)
#  Radial scar/complex sclerosing lesion, status post resection.  Genetic testing negative.  Continue annual mammogram, patient gets image ordered via primary care provider's office.   Recent screening mammogram showed left breast calcification.  She is scheduled to have a diagnostic mammogram tomorrow for further evaluation.

## 2022-11-16 NOTE — Progress Notes (Signed)
Hematology/Oncology follow up  note [phone] [phone]   Patient Care Team: Mick Sell, MD as PCP - General (Infectious Diseases) Nadara Mustard, MD as PCP - OBGYN (Obstetrics and Gynecology) Arvil Chaco, Belia Heman, MD (Unknown Physician Specialty) Lemar Livings, Merrily Pew, MD (General Surgery) Rickard Patience, MD as Consulting Physician (Oncology)  ASSESSMENT & PLAN:   MGUS (monoclonal gammopathy of unknown significance) # IgG MGUS [facility] results were reviewed and discussed with patient Stable kidney function and no anemia. M protein-0.7 1.1, light chain ratio increased to 3.54.  Improved Recommend continue observation.  Repeat labs in 6 months. [facility] prescription was provided.  Radial scar of breast #Radial scar/complex sclerosing lesion, status post resection.  Genetic testing negative.  Continue annual mammogram, patient gets image ordered via primary care provider's office.   Recent screening mammogram showed left breast calcification.  She is scheduled to have a diagnostic mammogram tomorrow for further evaluation.   Follow up in 6 months.  All questions were answered. The patient knows to call the clinic with any problems, questions or concerns.  Rickard Patience, MD, PhD [facility] Hematology Oncology 11/16/2022    CHIEF COMPLAINTS/REASON FOR VISIT:  Follow-up for MGUS  HISTORY OF PRESENTING ILLNESS:  Andrea Jones is a 62 y.o. female presents for follow-up of MGUS.  neurology work-up for foot numbness and tingling.Andrea Jones  SPEP showed 0.9 g/dL M spike,   , and IFE showed IgG Kappa monoclonal protein.  No aggravating or alleviated factors.  Associated signs or symptoms:  Neuropathy: Toe numbness and tingling. Denies weight loss, fever chills, bone pain, Bone pain: Denies Anemia denies  Patient also recently had an abnormal screening mammogram on 02/22/2019 and the patient was called back to perform unilateral left diagnostic breast mammogram on  03/06/2019 Showed irregular hypoechoic mass 0.6 x 0.4 x 0.7 cm in the left breast is suspicious for malignancy.  Axillary lymph node status was not commented on mammogram or targeted ultrasound. Patient is status post left breast mass biopsy-03/15/2019, biopsy showed radial scar/complex sclerosing lesion.  # 03/15/2019 left breast biopsy showed radial scar/complex sclerosing lesion # Radial scar/complex sclerosing lesion, status post resection.  Family history of ovarian cancer and breast cancer.  Patient has met genetic counselor and genetic testing is negative.  Pregnancy / Birth History Age at menarche   15 years. Age of menopause   34-50 Gravida   3 Maternal age   22-25 Para   3  INTERVAL HISTORY Andrea Jones is a 62 y.o. female who has above history reviewed by me today presents for follow up visit for management of MGUS, history of radial scar Patient had a blood work done at [facility].  She has no new complaints  Patient has diagnostic mammogram scheduled tomorrow for further evaluation of left breast calcification.    Review of Systems  Constitutional:  Negative for appetite change, chills, fatigue and fever.  HENT:   Negative for hearing loss and voice change.   Eyes:  Negative for eye problems.  Respiratory:  Negative for chest tightness and cough.   Cardiovascular:  Negative for chest pain.  Gastrointestinal:  Negative for abdominal distention, abdominal pain and blood in stool.  Endocrine: Negative for hot flashes.  Genitourinary:  Negative for difficulty urinating and frequency.   Musculoskeletal:  Negative for arthralgias.  Skin:  Negative for itching and rash.  Neurological:  Negative for extremity weakness.  Hematological:  Negative for adenopathy.  Psychiatric/Behavioral:  Negative for confusion.  MEDICAL HISTORY:  Past Medical History:  Diagnosis Date   Chest pain    Constipation    Family history of breast cancer    Family history of colon  cancer    Family history of ovarian cancer    8/19 and 9/21 cancer genetic testing letter sent   Family history of ovarian cancer    Heartburn    High blood pressure    Hot flashes    "pretty much gone"   Hypercholesteremia    Hyperlipidemia    Insomnia    Joint pain    Liver problem    MGUS (monoclonal gammopathy of unknown significance)    sees Dr. Cathie Hoops with hematology   MGUS (monoclonal gammopathy of unknown significance)    Nerve pain    in hands and feet   Palpitations    SOB (shortness of breath) on exertion    Unspecified lump in the left breast, unspecified quadrant    surgery scheduled for 07/15/20   Vitamin D deficiency 2016    SURGICAL HISTORY: Past Surgical History:  Procedure Laterality Date   ABDOMINAL HYSTERECTOMY  2001   APPENDECTOMY  1982   BREAST BIOPSY Left 1999   cyst,benign   BREAST BIOPSY Left 03/15/2019   [name] bx venus clip radial scar   BREAST LUMPECTOMY WITH RADIOACTIVE SEED LOCALIZATION Left 07/15/2020   Procedure: LEFT BREAST LUMPECTOMY WITH RADIOACTIVE SEED LOCALIZATION;  Surgeon: Griselda Miner, MD;  Location: [facility];  Service: General;  Laterality: Left;   CESAREAN SECTION  1985   COLONOSCOPY  06/13/2012   [place]. Normal Exam.   COLONOSCOPY WITH PROPOFOL N/A 10/26/2021   Procedure: COLONOSCOPY WITH PROPOFOL;  Surgeon: Wyline Mood, MD;  Location: [facility] END[facility]COPY;  Service: Gastroenterology;  Laterality: N/A;   HYSTERECTOMY ABDOMINAL WITH SALPINGECTOMY     mgus     OOPHORECTOMY     OVARY SURGERY  2000    SOCIAL HISTORY: Social History   Socioeconomic History   Marital status: Married    Spouse name: Not on file   Number of children: 3   Years of education: Not on file   Highest education level: Not on file  Occupational History   Occupation: International aid/development worker  Tobacco Use   Smoking status: Never   Smokeless tobacco: Never  Vaping Use   Vaping Use: Never used  Substance and Sexual Activity   Alcohol use: No     Alcohol/week: 0.0 standard drinks of alcohol   Drug use: No   Sexual activity: Not Currently  Other Topics Concern   Not on file  Social History Narrative   Lives at home with husband    Right handed   Caffeine: maybe 1 cup maybe 3 days a week   Social Determinants of Corporate investment banker Strain: Not on file  Food Insecurity: Not on file  Transportation Needs: Not on file  Physical Activity: Not on file  Stress: Not on file  Social Connections: Not on file  Intimate Partner Violence: Not on file    FAMILY HISTORY: Family History  Problem Relation Age of Onset   Colon cancer Father 3   Heart disease Father    Cancer Father    Alcoholism Father    Congestive Heart Failure Mother    Emphysema Mother    Hypertension Mother    Kidney failure Mother    Neuropathy Mother    Hyperlipidemia Mother    Depression Mother    Anxiety disorder Mother  Obesity Mother    Ovarian cancer Sister 62       pat 1/2 sister   Breast cancer Cousin 43   Breast cancer Cousin     ALLERGIES:  has No Known Allergies.  MEDICATIONS:  Current Outpatient Medications  Medication Sig Dispense Refill   amLODipine (NORVASC) 10 MG tablet Take 1 tablet by mouth daily.     Apple Cid Vn-Grn Tea-Bit Or-Cr (APPLE CIDER VINEGAR PLUS PO) Take by mouth. (Patient not taking: Reported on 11/15/2022)     Biotin 16109 MCG TABS Take 1 each by mouth daily. (Patient not taking: Reported on 11/16/2022)     Cinnamon 500 MG TABS Take by mouth. (Patient not taking: Reported on 11/16/2022)     Multiple Vitamins-Minerals (WOMENS MULTIVITAMIN PO) Take by mouth. (Patient not taking: Reported on 11/15/2022)     Omega-3 Fatty Acids (OMEGA 3 PO) Take 1,200 mg by mouth daily. (Patient not taking: Reported on 11/15/2022)     vitamin E 1000 UNIT capsule Take 1,000 Units by mouth daily. (Patient not taking: Reported on 11/15/2022)     No current facility-administered medications for this visit.     PHYSICAL  EXAMINATION: ECOG PERFORMANCE STATUS: 0 - Asymptomatic Vitals:   11/16/22 1400  BP: 137/71  Pulse: 65  Resp: 18  Temp: 97.6 F (36.4 C)  SpO2: 97%   Filed Weights   11/16/22 1400  Weight: 184 lb 11.2 oz (83.8 kg)    Physical Exam Constitutional:      General: She is not in acute distress. HENT:     Head: Normocephalic and atraumatic.  Eyes:     General: No scleral icterus. Cardiovascular:     Rate and Rhythm: Normal rate.  Pulmonary:     Effort: Pulmonary effort is normal. No respiratory distress.     Breath sounds: No wheezing.  Abdominal:     General: There is no distension.  Musculoskeletal:        General: No deformity. Normal range of motion.     Cervical back: Normal range of motion and neck supple.  Skin:    Findings: No erythema or rash.  Neurological:     Mental Status: She is alert and oriented to person, place, and time. Mental status is at baseline.     Cranial Nerves: No cranial nerve deficit.  Psychiatric:        Mood and Affect: Mood normal.      LABORATORY DATA:  I have reviewed the data as listed Lab Results  Component Value Date   WBC 6.4 07/08/2021   HGB 14.4 07/08/2021   HCT 42.2 07/08/2021   MCV 88 07/08/2021   PLT 231 07/08/2021   Recent Labs    12/21/21 1548  NA 142  K 4.1  CL 105  CO2 22  GLUCOSE 101*  BUN 14  CREATININE 0.83  CALCIUM 9.6  PROT 7.5  ALBUMIN 4.6  AST 25  ALT 36*  ALKPHOS 59  BILITOT 0.4    Iron/TIBC/Ferritin/ %Sat No results found for: "IRON", "TIBC", "FERRITIN", "IRONPCTSAT"   09/27/19 M protein 0.9 04/01/2020 M protein 1.1 09/29/2020 M protein 1, IgG 1415, hemoglobin 14.2, creatinine 0.95 04/12/2021, M protein 0.9, IgG 1495, hemoglobin 14.7, creatinine 0.95, free light chain ratio 3.6. 10/18/2021, M protein 1.1, IgG 1532, free light ratio, 3.94, hemoglobin 14.1, creatinine 0.85. 24-hour urine protein electrophoresis showed no M protein. 05/04/22 M protein 1.2 light chain ratio 4.04  RADIOGRAPHIC  STUDIES: I have personally reviewed the radiological images as  listed and agreed with the findings in the report.  MM 3D SCREENING MAMMOGRAM BILATERAL BREAST  Result Date: 11/11/2022 CLINICAL DATA:  Screening. History of LEFT breast surgical excision in 2022 for complex sclerosing lesion. EXAM: DIGITAL SCREENING BILATERAL MAMMOGRAM WITH TOMOSYNTHESIS AND CAD TECHNIQUE: Bilateral screening digital craniocaudal and mediolateral oblique mammograms were obtained. Bilateral screening digital breast tomosynthesis was performed. The images were evaluated with computer-aided detection. COMPARISON:  Previous exam(s). ACR Breast Density Category b: There are scattered areas of fibroglandular density. FINDINGS: In the left breast, calcifications warrant further evaluation. These calcifications are seen within the upper LEFT breast at posterior depth. In the right breast, no findings suspicious for malignancy. IMPRESSION: Further evaluation is suggested for calcifications in the left breast. These calcifications may represent benign fat necrosis related to earlier surgical excision. RECOMMENDATION: Diagnostic mammogram of the left breast. (Code:[id number]) The patient will be contacted regarding the findings, and additional imaging will be scheduled. BI-RADS CATEGORY  0: Incomplete: Need additional imaging evaluation. Electronically Signed   By: Bary Richard M.D.   On: 11/11/2022 12:51

## 2022-11-16 NOTE — Assessment & Plan Note (Signed)
#   IgG MGUS [facility] results were reviewed and discussed with patient Stable kidney function and no anemia. M protein-0.7 1.1, light chain ratio increased to 3.54.  Improved Recommend continue observation.  Repeat labs in 6 months. [facility] prescription was provided.

## 2022-11-17 NOTE — Discharge Instructions (Signed)

## 2022-11-18 ENCOUNTER — Ambulatory Visit
Admission: RE | Admit: 2022-11-18 | Discharge: 2022-11-18 | Disposition: A | Discharge status: Home / Self Care | Payer: Managed Care, Other (non HMO) | Source: Ambulatory Visit | Attending: Obstetrics & Gynecology | Admitting: Obstetrics & Gynecology

## 2022-11-18 DIAGNOSIS — R928 Other abnormal and inconclusive findings on diagnostic imaging of breast: Secondary | ICD-10-CM

## 2022-11-18 DIAGNOSIS — R921 Mammographic calcification found on diagnostic imaging of breast: Secondary | ICD-10-CM

## 2022-11-22 ENCOUNTER — Encounter: Payer: Self-pay | Admitting: Ophthalmology

## 2022-11-22 ENCOUNTER — Encounter
Admission: RE | Disposition: A | Discharge status: Home / Self Care | Payer: Self-pay | Source: Home / Self Care | Attending: Ophthalmology

## 2022-11-22 ENCOUNTER — Ambulatory Visit: Payer: Managed Care, Other (non HMO) | Admitting: Anesthesiology

## 2022-11-22 ENCOUNTER — Ambulatory Visit
Admission: RE | Admit: 2022-11-22 | Discharge: 2022-11-22 | Disposition: A | Discharge status: Home / Self Care | Payer: Managed Care, Other (non HMO) | Attending: Ophthalmology | Admitting: Ophthalmology

## 2022-11-22 ENCOUNTER — Other Ambulatory Visit: Payer: Self-pay

## 2022-11-22 DIAGNOSIS — I10 Essential (primary) hypertension: Secondary | ICD-10-CM | POA: Insufficient documentation

## 2022-11-22 DIAGNOSIS — H2511 Age-related nuclear cataract, right eye: Secondary | ICD-10-CM | POA: Diagnosis present

## 2022-11-22 HISTORY — PX: CATARACT EXTRACTION W/PHACO: SHX586

## 2022-11-22 SURGERY — PHACOEMULSIFICATION, CATARACT, WITH IOL INSERTION
Anesthesia: Monitor Anesthesia Care | Site: Eye | Laterality: Right

## 2022-11-22 MED ORDER — ARMC OPHTHALMIC DILATING DROPS
1.0000 | OPHTHALMIC | Status: DC | PRN
  Administered 2022-11-22 (×3): 1 via OPHTHALMIC

## 2022-11-22 MED ORDER — SIGHTPATH DOSE#1 BSS IO SOLN
INTRAOCULAR | Status: DC | PRN
  Administered 2022-11-22: 15 mL

## 2022-11-22 MED ORDER — BRIMONIDINE TARTRATE-TIMOLOL 0.2-0.5 % OP SOLN
OPHTHALMIC | Status: DC | PRN
  Administered 2022-11-22: 1 [drp] via OPHTHALMIC

## 2022-11-22 MED ORDER — MIDAZOLAM HCL 2 MG/2ML IJ SOLN
INTRAMUSCULAR | Status: DC | PRN
  Administered 2022-11-22: 2 mg via INTRAVENOUS

## 2022-11-22 MED ORDER — SIGHTPATH DOSE#1 BSS IO SOLN
INTRAOCULAR | Status: DC | PRN
  Administered 2022-11-22: 51 mL via OPHTHALMIC

## 2022-11-22 MED ORDER — SIGHTPATH DOSE#1 BSS IO SOLN
INTRAOCULAR | Status: DC | PRN
  Administered 2022-11-22: 1 mL via INTRAMUSCULAR

## 2022-11-22 MED ORDER — MOXIFLOXACIN HCL 0.5 % OP SOLN
OPHTHALMIC | Status: DC | PRN
  Administered 2022-11-22: .2 mL via OPHTHALMIC

## 2022-11-22 MED ORDER — SIGHTPATH DOSE#1 NA CHONDROIT SULF-NA HYALURON 40-17 MG/ML IO SOLN
INTRAOCULAR | Status: DC | PRN
  Administered 2022-11-22: 1 mL via INTRAOCULAR

## 2022-11-22 MED ORDER — TETRACAINE HCL 0.5 % OP SOLN
1.0000 [drp] | OPHTHALMIC | Status: DC | PRN
  Administered 2022-11-22 (×3): 1 [drp] via OPHTHALMIC

## 2022-11-22 MED ORDER — LACTATED RINGERS IV SOLN: INTRAVENOUS | Status: DC

## 2022-11-22 SURGICAL SUPPLY — 11 items
ANGLE REVERSE CUT SHRT 25GA (CUTTER) ×1
CATARACT SUITE SIGHTPATH (MISCELLANEOUS) ×1 IMPLANT
CYSTOTOME ANGL RVRS SHRT 25G (CUTTER) ×1 IMPLANT
CYSTOTOME ANGL RVRS SHRT 25GA (CUTTER) ×1 IMPLANT
FEE CATARACT SUITE SIGHTPATH (MISCELLANEOUS) ×1 IMPLANT
GLOVE BIOGEL PI IND STRL 8 (GLOVE) ×1 IMPLANT
GLOVE SURG ENC TEXT LTX SZ8 (GLOVE) ×1 IMPLANT
LENS IOL TECNIS EYHANCE 20.0 (Intraocular Lens) IMPLANT
NDL FILTER BLUNT 18X1 1/2 (NEEDLE) ×1 IMPLANT
NEEDLE FILTER BLUNT 18X1 1/2 (NEEDLE) ×1 IMPLANT
SYR 3ML LL SCALE MARK (SYRINGE) ×1 IMPLANT

## 2022-11-22 NOTE — Anesthesia Postprocedure Evaluation (Signed)
Anesthesia Post Note  Patient: Andrea Jones  Procedure(s) Performed: CATARACT EXTRACTION PHACO AND INTRAOCULAR LENS PLACEMENT (IOC) RIGHT  6.00  00:31.8 (Right: Eye)  Patient location during evaluation: PACU Anesthesia Type: MAC Level of consciousness: awake and alert Pain management: pain level controlled Vital Signs Assessment: post-procedure vital signs reviewed and stable Respiratory status: spontaneous breathing, nonlabored ventilation, respiratory function stable and patient connected to nasal cannula oxygen Cardiovascular status: stable and blood pressure returned to baseline Postop Assessment: no apparent nausea or vomiting Anesthetic complications: no   No notable events documented.   Last Vitals:  Vitals:   11/22/22 1155 11/22/22 1159  BP:  102/72  Pulse: 62 61  Resp: (!) 22 10  Temp: 36.8 C   SpO2: 96% 95%    Last Pain:  Vitals:   11/22/22 1159  TempSrc:   PainSc: 0-No pain                 Terelle Dobler C Arlin Savona

## 2022-11-22 NOTE — Op Note (Signed)
PREOPERATIVE DIAGNOSIS:  Nuclear sclerotic cataract of the right eye.   POSTOPERATIVE DIAGNOSIS:  H25.11 Cataract   OPERATIVE PROCEDURE:ORPROCALL@   SURGEON:  Galen Manila, MD.   ANESTHESIA:  Anesthesiologist: Marisue Humble, MD CRNA: Barbette Hair, CRNA  1.      Managed anesthesia care. 2.      0.60ml of Shugarcaine was instilled in the eye following the paracentesis.   COMPLICATIONS:  None.   TECHNIQUE:   Stop and chop   DESCRIPTION OF PROCEDURE:  The patient was examined and consented in the preoperative holding area where the aforementioned topical anesthesia was applied to the right eye and then brought back to the Operating Room where the right eye was prepped and draped in the usual sterile ophthalmic fashion and a lid speculum was placed. A paracentesis was created with the side port blade and the anterior chamber was filled with viscoelastic. A near clear corneal incision was performed with the steel keratome. A continuous curvilinear capsulorrhexis was performed with a cystotome followed by the capsulorrhexis forceps. Hydrodissection and hydrodelineation were carried out with BSS on a blunt cannula. The lens was removed in a stop and chop  technique and the remaining cortical material was removed with the irrigation-aspiration handpiece. The capsular bag was inflated with viscoelastic and the Technis [id number]  lens was placed in the capsular bag without complication. The remaining viscoelastic was removed from the eye with the irrigation-aspiration handpiece. The wounds were hydrated. The anterior chamber was flushed with BSS and the eye was inflated to physiologic pressure. 0.62ml of Vigamox was placed in the anterior chamber. The wounds were found to be water tight. The eye was dressed with Combigan. The patient was given protective glasses to wear throughout the day and a shield with which to sleep tonight. The patient was also given drops with which to begin a drop regimen today and  will follow-up with me in one day. Implant Name Type Inv. Item Serial No. Manufacturer Lot No. LRB No. Used Action  LENS IOL TECNIS EYHANCE 20.0 - [id number] Intraocular Lens LENS IOL TECNIS EYHANCE 20.0 [id number] [employer]  Right 1 Implanted   Procedure(s): CATARACT EXTRACTION PHACO AND INTRAOCULAR LENS PLACEMENT (IOC) RIGHT  6.00  00:31.8 (Right)  Electronically signed: Galen Manila 11/22/2022 11:53 AM

## 2022-11-22 NOTE — H&P (Signed)
[facility]   Primary Care Physician:  Mick Sell, MD Ophthalmologist: Dr. Maren Reamer  Pre-Procedure History & Physical: HPI:  Andrea Jones is a 62 y.o. female here for cataract surgery.   Past Medical History:  Diagnosis Date   Chest pain    Constipation    Family history of breast cancer    Family history of colon cancer    Family history of ovarian cancer    8/19 and 9/21 cancer genetic testing letter sent   Family history of ovarian cancer    Heartburn    High blood pressure    Hot flashes    "pretty much gone"   Hypercholesteremia    Hyperlipidemia    Insomnia    Joint pain    Liver problem    MGUS (monoclonal gammopathy of unknown significance)    sees Dr. Cathie Hoops with hematology   MGUS (monoclonal gammopathy of unknown significance)    Nerve pain    in hands and feet   Palpitations    SOB (shortness of breath) on exertion    Unspecified lump in the left breast, unspecified quadrant    surgery scheduled for 07/15/20   Vitamin D deficiency 2016    Past Surgical History:  Procedure Laterality Date   ABDOMINAL HYSTERECTOMY  2001   APPENDECTOMY  1982   BREAST BIOPSY Left 1999   cyst,benign   BREAST BIOPSY Left 03/15/2019   [name] bx venus clip radial scar   BREAST LUMPECTOMY WITH RADIOACTIVE SEED LOCALIZATION Left 07/15/2020   Procedure: LEFT BREAST LUMPECTOMY WITH RADIOACTIVE SEED LOCALIZATION;  Surgeon: Griselda Miner, MD;  Location: [facility];  Service: General;  Laterality: Left;   CESAREAN SECTION  1985   COLONOSCOPY  06/13/2012   [place]. Normal Exam.   COLONOSCOPY WITH PROPOFOL N/A 10/26/2021   Procedure: COLONOSCOPY WITH PROPOFOL;  Surgeon: Wyline Mood, MD;  Location: [facility];  Service: Gastroenterology;  Laterality: N/A;   HYSTERECTOMY ABDOMINAL WITH SALPINGECTOMY     mgus     OOPHORECTOMY     OVARY SURGERY  2000    Prior to Admission medications   Medication Sig Start Date End Date Taking? Authorizing Provider   amLODipine (NORVASC) 10 MG tablet Take 1 tablet by mouth daily. 09/24/21 11/22/22 Yes [provider]  Apple Cid Vn-Grn Tea-Bit Or-Cr (APPLE CIDER VINEGAR PLUS PO) Take by mouth. Patient not taking: Reported on 11/15/2022    [provider]  Biotin 78469 MCG TABS Take 1 each by mouth daily. Patient not taking: Reported on 11/16/2022    [provider]  Cinnamon 500 MG TABS Take by mouth. Patient not taking: Reported on 11/16/2022    [provider]  Multiple Vitamins-Minerals (WOMENS MULTIVITAMIN PO) Take by mouth. Patient not taking: Reported on 11/15/2022    [provider]  Omega-3 Fatty Acids (OMEGA 3 PO) Take 1,200 mg by mouth daily. Patient not taking: Reported on 11/15/2022    [provider]  vitamin E 1000 UNIT capsule Take 1,000 Units by mouth daily. Patient not taking: Reported on 11/15/2022    [provider]    Allergies as of 10/26/2022   (No Known Allergies)    Family History  Problem Relation Age of Onset   Colon cancer Father 32   Heart disease Father    Cancer Father    Alcoholism Father    Congestive Heart Failure Mother    Emphysema Mother    Hypertension Mother    Kidney failure  Mother    Neuropathy Mother    Hyperlipidemia Mother    Depression Mother    Anxiety disorder Mother    Obesity Mother    Ovarian cancer Sister 26       pat 1/2 sister   Breast cancer Cousin 27   Breast cancer Cousin     Social History   Socioeconomic History   Marital status: Married    Spouse name: Not on file   Number of children: 3   Years of education: Not on file   Highest education level: Not on file  Occupational History   Occupation: International aid/development worker  Tobacco Use   Smoking status: Never   Smokeless tobacco: Never  Vaping Use   Vaping Use: Never used  Substance and Sexual Activity   Alcohol use: No    Alcohol/week: 0.0 standard drinks of alcohol   Drug use: No   Sexual activity: Not Currently   Other Topics Concern   Not on file  Social History Narrative   Lives at home with husband    Right handed   Caffeine: maybe 1 cup maybe 3 days a week   Social Determinants of Corporate investment banker Strain: Not on file  Food Insecurity: Not on file  Transportation Needs: Not on file  Physical Activity: Not on file  Stress: Not on file  Social Connections: Not on file  Intimate Partner Violence: Not on file    Review of Systems: See HPI, otherwise negative ROS  Physical Exam: BP (!) 139/90   Pulse 64   Temp 97.7 F (36.5 C) (Temporal)   Resp 18   Ht 5\' 2"  (1.575 m)   Wt 82.7 kg   SpO2 98%   BMI 33.34 kg/m  General:   Alert, cooperative in NAD Head:  Normocephalic and atraumatic. Respiratory:  Normal work of breathing. Cardiovascular:  RRR  Impression/Plan: Andrea Jones is here for cataract surgery.  Risks, benefits, limitations, and alternatives regarding cataract surgery have been reviewed with the patient.  Questions have been answered.  All parties agreeable.   Galen Manila, MD  11/22/2022, 11:24 AM

## 2022-11-22 NOTE — Transfer of Care (Signed)
Immediate Anesthesia Transfer of Care Note  Patient: Andrea Jones  Procedure(s) Performed: CATARACT EXTRACTION PHACO AND INTRAOCULAR LENS PLACEMENT (IOC) RIGHT  6.00  00:31.8 (Right: Eye)  Patient Location: PACU  Anesthesia Type: MAC  Level of Consciousness: awake, alert  and patient cooperative  Airway and Oxygen Therapy: Patient Spontanous Breathing and Patient connected to supplemental oxygen  Post-op Assessment: Post-op Vital signs reviewed, Patient's Cardiovascular Status Stable, Respiratory Function Stable, Patent Airway and No signs of Nausea or vomiting  Post-op Vital Signs: Reviewed and stable  Complications: No notable events documented.

## 2022-11-23 ENCOUNTER — Encounter: Payer: Self-pay | Admitting: Ophthalmology

## 2022-11-28 NOTE — Anesthesia Preprocedure Evaluation (Addendum)
Anesthesia Evaluation  Patient identified by MRN, date of birth, ID band Patient awake    Reviewed: Allergy & Precautions, H&P , NPO status , Patient's Chart, lab work & pertinent test results  Airway Mallampati: IV  TM Distance: <3 FB Neck ROM: Full    Dental no notable dental hx.    Pulmonary neg pulmonary ROS   Pulmonary exam normal breath sounds clear to auscultation       Cardiovascular hypertension, Normal cardiovascular exam Rhythm:Regular Rate:Normal     Neuro/Psych  PSYCHIATRIC DISORDERS Anxiety      Neuromuscular disease    GI/Hepatic negative GI ROS, Neg liver ROS,,,  Endo/Other  negative endocrine ROS    Renal/GU negative Renal ROS  negative genitourinary   Musculoskeletal negative musculoskeletal ROS (+)    Abdominal   Peds negative pediatric ROS (+)  Hematology negative hematology ROS (+)   Anesthesia Other Findings  Previous cataract surgery  11-22-22  Hyperlipidemia  Vitamin D deficiency Hypercholesteremia  Insomnia Hot flashes  MGUS (monoclonal gammopathy of unknown significance)  Palpitations Chest pain  High blood pressure Joint pain  Heartburn Liver problem Constipation SOB (shortness of breath) on exertion MGUS (monoclonal gammopathy of unknown significance) Nerve pain     Reproductive/Obstetrics negative OB ROS                             Anesthesia Physical Anesthesia Plan  ASA: 2  Anesthesia Plan: MAC   Post-op Pain Management:    Induction: Intravenous  PONV Risk Score and Plan:   Airway Management Planned: Natural Airway and Nasal Cannula  Additional Equipment:   Intra-op Plan:   Post-operative Plan:   Informed Consent: I have reviewed the patients History and Physical, chart, labs and discussed the procedure including the risks, benefits and alternatives for the proposed anesthesia with the patient or authorized representative who  has indicated his/her understanding and acceptance.     Dental Advisory Given  Plan Discussed with: Anesthesiologist, CRNA and Surgeon  Anesthesia Plan Comments: (Patient consented for risks of anesthesia including but not limited to:  - adverse reactions to medications - damage to eyes, teeth, lips or other oral mucosa - nerve damage due to positioning  - sore throat or hoarseness - Damage to heart, brain, nerves, lungs, other parts of body or loss of life  Patient voiced understanding.)        Anesthesia Quick Evaluation

## 2022-12-01 NOTE — Discharge Instructions (Signed)

## 2022-12-06 ENCOUNTER — Other Ambulatory Visit: Payer: Self-pay

## 2022-12-06 ENCOUNTER — Encounter: Payer: Self-pay | Admitting: Ophthalmology

## 2022-12-06 ENCOUNTER — Ambulatory Visit: Payer: Managed Care, Other (non HMO) | Admitting: Anesthesiology

## 2022-12-06 ENCOUNTER — Ambulatory Visit
Admission: RE | Admit: 2022-12-06 | Discharge: 2022-12-06 | Disposition: A | Discharge status: Home / Self Care | Payer: Managed Care, Other (non HMO) | Attending: Ophthalmology | Admitting: Ophthalmology

## 2022-12-06 ENCOUNTER — Encounter
Admission: RE | Disposition: A | Discharge status: Home / Self Care | Payer: Self-pay | Source: Home / Self Care | Attending: Ophthalmology

## 2022-12-06 DIAGNOSIS — I10 Essential (primary) hypertension: Secondary | ICD-10-CM | POA: Insufficient documentation

## 2022-12-06 DIAGNOSIS — H2512 Age-related nuclear cataract, left eye: Secondary | ICD-10-CM | POA: Diagnosis not present

## 2022-12-06 HISTORY — PX: CATARACT EXTRACTION W/PHACO: SHX586

## 2022-12-06 SURGERY — PHACOEMULSIFICATION, CATARACT, WITH IOL INSERTION
Anesthesia: Monitor Anesthesia Care | Site: Eye | Laterality: Left

## 2022-12-06 MED ORDER — BRIMONIDINE TARTRATE-TIMOLOL 0.2-0.5 % OP SOLN
OPHTHALMIC | Status: DC | PRN
  Administered 2022-12-06: 1 [drp] via OPHTHALMIC

## 2022-12-06 MED ORDER — SIGHTPATH DOSE#1 BSS IO SOLN
INTRAOCULAR | Status: DC | PRN
  Administered 2022-12-06: 35 mL via OPHTHALMIC

## 2022-12-06 MED ORDER — SIGHTPATH DOSE#1 BSS IO SOLN
INTRAOCULAR | Status: DC | PRN
  Administered 2022-12-06: 2 mL

## 2022-12-06 MED ORDER — MIDAZOLAM HCL 2 MG/2ML IJ SOLN
INTRAMUSCULAR | Status: DC | PRN
  Administered 2022-12-06: 2 mg via INTRAVENOUS

## 2022-12-06 MED ORDER — TETRACAINE HCL 0.5 % OP SOLN
1.0000 [drp] | OPHTHALMIC | Status: DC | PRN
  Administered 2022-12-06 (×3): 1 [drp] via OPHTHALMIC

## 2022-12-06 MED ORDER — SIGHTPATH DOSE#1 NA CHONDROIT SULF-NA HYALURON 40-17 MG/ML IO SOLN
INTRAOCULAR | Status: DC | PRN
  Administered 2022-12-06: 1 mL via INTRAOCULAR

## 2022-12-06 MED ORDER — ARMC OPHTHALMIC DILATING DROPS
1.0000 | OPHTHALMIC | Status: DC | PRN
  Administered 2022-12-06 (×3): 1 via OPHTHALMIC

## 2022-12-06 MED ORDER — SIGHTPATH DOSE#1 BSS IO SOLN
INTRAOCULAR | Status: DC | PRN
  Administered 2022-12-06: 15 mL via INTRAOCULAR

## 2022-12-06 MED ORDER — MOXIFLOXACIN HCL 0.5 % OP SOLN
OPHTHALMIC | Status: DC | PRN
  Administered 2022-12-06: .2 mL via OPHTHALMIC

## 2022-12-06 MED ORDER — LACTATED RINGERS IV SOLN: INTRAVENOUS | Status: DC

## 2022-12-06 SURGICAL SUPPLY — 11 items
ANGLE REVERSE CUT SHRT 25GA (CUTTER) ×1
CATARACT SUITE SIGHTPATH (MISCELLANEOUS) ×1 IMPLANT
CYSTOTOME ANGL RVRS SHRT 25G (CUTTER) ×1 IMPLANT
CYSTOTOME ANGL RVRS SHRT 25GA (CUTTER) ×1 IMPLANT
FEE CATARACT SUITE SIGHTPATH (MISCELLANEOUS) ×1 IMPLANT
GLOVE BIOGEL PI IND STRL 8 (GLOVE) ×1 IMPLANT
GLOVE SURG ENC TEXT LTX SZ8 (GLOVE) ×1 IMPLANT
LENS IOL TECNIS EYHANCE 20.0 (Intraocular Lens) IMPLANT
NDL FILTER BLUNT 18X1 1/2 (NEEDLE) ×1 IMPLANT
NEEDLE FILTER BLUNT 18X1 1/2 (NEEDLE) ×1 IMPLANT
SYR 3ML LL SCALE MARK (SYRINGE) ×1 IMPLANT

## 2022-12-06 NOTE — Transfer of Care (Signed)
Immediate Anesthesia Transfer of Care Note  Patient: Andrea Jones  Procedure(s) Performed: CATARACT EXTRACTION PHACO AND INTRAOCULAR LENS PLACEMENT (IOC) LEFT 5.36 00:33.7 (Left: Eye)  Patient Location: PACU  Anesthesia Type: MAC  Level of Consciousness: awake, alert  and patient cooperative  Airway and Oxygen Therapy: Patient Spontanous Breathing and Patient connected to supplemental oxygen  Post-op Assessment: Post-op Vital signs reviewed, Patient's Cardiovascular Status Stable, Respiratory Function Stable, Patent Airway and No signs of Nausea or vomiting  Post-op Vital Signs: Reviewed and stable  Complications: No notable events documented.

## 2022-12-06 NOTE — Op Note (Signed)
PREOPERATIVE DIAGNOSIS:  Nuclear sclerotic cataract of the left eye.   POSTOPERATIVE DIAGNOSIS:  Nuclear sclerotic cataract of the left eye.   OPERATIVE PROCEDURE:ORPROCALL@   SURGEON:  Galen Manila, MD.   ANESTHESIA:  Anesthesiologist: Marisue Humble, MD CRNA: Barbette Hair, CRNA  1.      Managed anesthesia care. 2.     0.88ml of Shugarcaine was instilled following the paracentesis   COMPLICATIONS:  None.   TECHNIQUE:   Stop and chop   DESCRIPTION OF PROCEDURE:  The patient was examined and consented in the preoperative holding area where the aforementioned topical anesthesia was applied to the left eye and then brought back to the Operating Room where the left eye was prepped and draped in the usual sterile ophthalmic fashion and a lid speculum was placed. A paracentesis was created with the side port blade and the anterior chamber was filled with viscoelastic. A near clear corneal incision was performed with the steel keratome. A continuous curvilinear capsulorrhexis was performed with a cystotome followed by the capsulorrhexis forceps. Hydrodissection and hydrodelineation were carried out with BSS on a blunt cannula. The lens was removed in a stop and chop  technique and the remaining cortical material was removed with the irrigation-aspiration handpiece. The capsular bag was inflated with viscoelastic and the Technis DIB00 lens was placed in the capsular bag without complication. The remaining viscoelastic was removed from the eye with the irrigation-aspiration handpiece. The wounds were hydrated. The anterior chamber was flushed with BSS and the eye was inflated to physiologic pressure. 0.39ml Vigamox was placed in the anterior chamber. The wounds were found to be water tight. The eye was dressed with Combigan. The patient was given protective glasses to wear throughout the day and a shield with which to sleep tonight. The patient was also given drops with which to begin a drop regimen  today and will follow-up with me in one day. Implant Name Type Inv. Item Serial No. Manufacturer Lot No. LRB No. Used Action  LENS IOL TECNIS EYHANCE 20.0 - [id number] Intraocular Lens LENS IOL TECNIS EYHANCE 20.0 [id number] [employer]  Left 1 Implanted    Procedure(s): CATARACT EXTRACTION PHACO AND INTRAOCULAR LENS PLACEMENT (IOC) LEFT 5.36 00:33.7 (Left)  Electronically signed: Galen Manila 12/06/2022 9:14 AM

## 2022-12-06 NOTE — Anesthesia Postprocedure Evaluation (Signed)
Anesthesia Post Note  Patient: Andrea Jones  Procedure(s) Performed: CATARACT EXTRACTION PHACO AND INTRAOCULAR LENS PLACEMENT (IOC) LEFT 5.36 00:33.7 (Left: Eye)  Patient location during evaluation: PACU Anesthesia Type: MAC Level of consciousness: awake and alert Pain management: pain level controlled Vital Signs Assessment: post-procedure vital signs reviewed and stable Respiratory status: spontaneous breathing, nonlabored ventilation, respiratory function stable and patient connected to nasal cannula oxygen Cardiovascular status: stable and blood pressure returned to baseline Postop Assessment: no apparent nausea or vomiting Anesthetic complications: no   No notable events documented.   Last Vitals:  Vitals:   12/06/22 0915 12/06/22 0920  BP: 111/72   Pulse: (!) 57 63  Resp: 10 14  Temp: (!) 36.4 C (!) 36.4 C  SpO2: 95% 94%    Last Pain:  Vitals:   12/06/22 0920  TempSrc:   PainSc: 0-No pain                 De Libman C Jeni Duling

## 2022-12-06 NOTE — H&P (Signed)
[facility]   Primary Care Physician:  Mick Sell, MD Ophthalmologist: Dr. Druscilla Brownie  Pre-Procedure History & Physical: HPI:  Andrea Jones is a 62 y.o. female here for cataract surgery.   Past Medical History:  Diagnosis Date   Chest pain    Constipation    Family history of breast cancer    Family history of colon cancer    Family history of ovarian cancer    8/19 and 9/21 cancer genetic testing letter sent   Family history of ovarian cancer    Heartburn    High blood pressure    Hot flashes    "pretty much gone"   Hypercholesteremia    Hyperlipidemia    Insomnia    Joint pain    Liver problem    MGUS (monoclonal gammopathy of unknown significance)    sees Dr. Cathie Hoops with hematology   MGUS (monoclonal gammopathy of unknown significance)    Nerve pain    in hands and feet   Palpitations    SOB (shortness of breath) on exertion    Unspecified lump in the left breast, unspecified quadrant    surgery scheduled for 07/15/20   Vitamin D deficiency 2016    Past Surgical History:  Procedure Laterality Date   ABDOMINAL HYSTERECTOMY  2001   APPENDECTOMY  1982   BREAST BIOPSY Left 1999   cyst,benign   BREAST BIOPSY Left 03/15/2019   [name] bx venus clip radial scar   BREAST LUMPECTOMY WITH RADIOACTIVE SEED LOCALIZATION Left 07/15/2020   Procedure: LEFT BREAST LUMPECTOMY WITH RADIOACTIVE SEED LOCALIZATION;  Surgeon: Griselda Miner, MD;  Location: [facility];  Service: General;  Laterality: Left;   CATARACT EXTRACTION W/PHACO Right 11/22/2022   Procedure: CATARACT EXTRACTION PHACO AND INTRAOCULAR LENS PLACEMENT (IOC) RIGHT  6.00  00:31.8;  Surgeon: Galen Manila, MD;  Location: [facility];  Service: Ophthalmology;  Laterality: Right;   CESAREAN SECTION  1985   COLONOSCOPY  06/13/2012   [facility]. Normal Exam.   COLONOSCOPY WITH PROPOFOL N/A 10/26/2021   Procedure: COLONOSCOPY WITH PROPOFOL;  Surgeon: Wyline Mood, MD;  Location: [facility]  ENDOSCOPY;  Service: Gastroenterology;  Laterality: N/A;   HYSTERECTOMY ABDOMINAL WITH SALPINGECTOMY     mgus     OOPHORECTOMY     OVARY SURGERY  2000    Prior to Admission medications   Medication Sig Start Date End Date Taking? Authorizing Provider  amLODipine (NORVASC) 10 MG tablet Take 1 tablet by mouth daily. 09/24/21 12/06/22 Yes [provider]  Apple Cid Vn-Grn Tea-Bit Or-Cr (APPLE CIDER VINEGAR PLUS PO) Take by mouth. Patient not taking: Reported on 11/15/2022    [provider]  Biotin 16109 MCG TABS Take 1 each by mouth daily. Patient not taking: Reported on 11/16/2022    [provider]  Cinnamon 500 MG TABS Take by mouth. Patient not taking: Reported on 11/16/2022    [provider]  Multiple Vitamins-Minerals (WOMENS MULTIVITAMIN PO) Take by mouth. Patient not taking: Reported on 11/15/2022    [provider]  Omega-3 Fatty Acids (OMEGA 3 PO) Take 1,200 mg by mouth daily. Patient not taking: Reported on 11/15/2022    [provider]  vitamin E 1000 UNIT capsule Take 1,000 Units by mouth daily. Patient not taking: Reported on 11/15/2022    [provider]    Allergies as of 10/26/2022   (No Known Allergies)    Family History  Problem Relation Age of Onset   Colon cancer  Father 49   Heart disease Father    Cancer Father    Alcoholism Father    Congestive Heart Failure Mother    Emphysema Mother    Hypertension Mother    Kidney failure Mother    Neuropathy Mother    Hyperlipidemia Mother    Depression Mother    Anxiety disorder Mother    Obesity Mother    Ovarian cancer Sister 50       pat 1/2 sister   Breast cancer Cousin 34   Breast cancer Cousin     Social History   Socioeconomic History   Marital status: Married    Spouse name: Not on file   Number of children: 3   Years of education: Not on file   Highest education level: Not on file  Occupational History   Occupation: Neurosurgeon  Tobacco Use   Smoking status: Never   Smokeless tobacco: Never  Vaping Use   Vaping Use: Never used  Substance and Sexual Activity   Alcohol use: No    Alcohol/week: 0.0 standard drinks of alcohol   Drug use: No   Sexual activity: Not Currently  Other Topics Concern   Not on file  Social History Narrative   Lives at home with husband    Right handed   Caffeine: maybe 1 cup maybe 3 days a week   Social Determinants of Corporate investment banker Strain: Not on file  Food Insecurity: Not on file  Transportation Needs: Not on file  Physical Activity: Not on file  Stress: Not on file  Social Connections: Not on file  Intimate Partner Violence: Not on file    Review of Systems: See HPI, otherwise negative ROS  Physical Exam: BP (!) 140/76   Temp 98.1 F (36.7 C) (Temporal)   Resp 15   Ht 5' 2.01" (1.575 m)   Wt 83 kg   SpO2 97%   BMI 33.44 kg/m  General:   Alert, cooperative in NAD Head:  Normocephalic and atraumatic. Respiratory:  Normal work of breathing. Cardiovascular:  RRR  Impression/Plan: Andrea Jones is here for cataract surgery.  Risks, benefits, limitations, and alternatives regarding cataract surgery have been reviewed with the patient.  Questions have been answered.  All parties agreeable.   Galen Manila, MD  12/06/2022, 8:53 AM

## 2022-12-07 ENCOUNTER — Encounter: Payer: Self-pay | Admitting: Ophthalmology

## 2023-05-02 ENCOUNTER — Other Ambulatory Visit: Payer: Self-pay | Admitting: Obstetrics & Gynecology

## 2023-05-08 ENCOUNTER — Other Ambulatory Visit: Payer: Self-pay | Admitting: Obstetrics & Gynecology

## 2023-05-08 DIAGNOSIS — R921 Mammographic calcification found on diagnostic imaging of breast: Secondary | ICD-10-CM

## 2023-05-15 ENCOUNTER — Inpatient Hospital Stay: Payer: Managed Care, Other (non HMO) | Attending: Oncology | Admitting: Oncology

## 2023-05-15 ENCOUNTER — Encounter: Payer: Self-pay | Admitting: Oncology

## 2023-05-15 VITALS — BP 147/81 | HR 67 | Temp 97.5°F | Resp 18 | Wt 180.2 lb

## 2023-05-15 DIAGNOSIS — N6489 Other specified disorders of breast: Secondary | ICD-10-CM

## 2023-05-15 DIAGNOSIS — D472 Monoclonal gammopathy: Secondary | ICD-10-CM | POA: Diagnosis present

## 2023-05-15 DIAGNOSIS — Z8041 Family history of malignant neoplasm of ovary: Secondary | ICD-10-CM | POA: Insufficient documentation

## 2023-05-15 DIAGNOSIS — Z800 Family history of malignant neoplasm of digestive organs: Secondary | ICD-10-CM | POA: Diagnosis not present

## 2023-05-15 DIAGNOSIS — Z803 Family history of malignant neoplasm of breast: Secondary | ICD-10-CM | POA: Insufficient documentation

## 2023-05-15 NOTE — Assessment & Plan Note (Addendum)
#  Radial scar/complex sclerosing lesion 2022, status post resection.  Genetic testing negative.  Continue annual mammogram, patient gets image ordered via primary care provider's office.   left breast calcification.  She is scheduled to have a diagnostic mammogram in Dec.

## 2023-05-15 NOTE — Progress Notes (Signed)
Hematology/Oncology follow up  note [phone] [phone]   Patient Care Team: Mick Sell, MD as PCP - General (Infectious Diseases) Nadara Mustard, MD as PCP - OBGYN (Obstetrics and Gynecology) Arvil Chaco, Belia Heman, MD (Unknown Physician Specialty) Lemar Livings, Merrily Pew, MD (General Surgery) Rickard Patience, MD as Consulting Physician (Oncology)  ASSESSMENT & PLAN:   MGUS (monoclonal gammopathy of unknown significance) # IgG MGUS [facility] results were reviewed and discussed with patient Stable kidney function and no anemia. M protein-1, light chain ratio increased to 3.94. stable.  Recommend continue observation.  Repeat labs in 6 months. [facility] prescription was provided.  Radial scar of breast #Radial scar/complex sclerosing lesion 2022, status post resection.  Genetic testing negative.  Continue annual mammogram, patient gets image ordered via primary care provider's office.   left breast calcification.  She is scheduled to have a diagnostic mammogram in Dec.   Follow up in 6 months.  All questions were answered. The patient knows to call the clinic with any problems, questions or concerns.  Rickard Patience, MD, PhD [facility] Hematology Oncology 05/15/2023    CHIEF COMPLAINTS/REASON FOR VISIT:  Follow-up for MGUS  HISTORY OF PRESENTING ILLNESS:  Andrea Jones is a 62 y.o. female presents for follow-up of MGUS.  neurology work-up for foot numbness and tingling.Andrea Jones  SPEP showed 0.9 g/dL M spike,   , and IFE showed IgG Kappa monoclonal protein.  No aggravating or alleviated factors.  Associated signs or symptoms:  Neuropathy: Toe numbness and tingling. Denies weight loss, fever chills, bone pain, Bone pain: Denies Anemia denies  Patient also recently had an abnormal screening mammogram on 02/22/2019 and the patient was called back to perform unilateral left diagnostic breast mammogram on 03/06/2019 Showed irregular hypoechoic mass 0.6 x 0.4 x 0.7 cm  in the left breast is suspicious for malignancy.  Axillary lymph node status was not commented on mammogram or targeted ultrasound. Patient is status post left breast mass biopsy-03/15/2019, biopsy showed radial scar/complex sclerosing lesion.  # 03/15/2019 left breast biopsy showed radial scar/complex sclerosing lesion # Radial scar/complex sclerosing lesion, status post resection.  Family history of ovarian cancer and breast cancer.  Patient has met genetic counselor and genetic testing is negative.  Pregnancy / Birth History Age at menarche   15 years. Age of menopause   50-50 Gravida   3 Maternal age   53-25 Para   3  INTERVAL HISTORY Andrea Jones is a 62 y.o. female who has above history reviewed by me today presents for follow up visit for management of MGUS, history of radial scar Patient had a blood work done at [facility].  She has no new complaints  Patient has diagnostic mammogram scheduled for follow up of left breast calcification.    Review of Systems  Constitutional:  Negative for appetite change, chills, fatigue and fever.  HENT:   Negative for hearing loss and voice change.   Eyes:  Negative for eye problems.  Respiratory:  Negative for chest tightness and cough.   Cardiovascular:  Negative for chest pain.  Gastrointestinal:  Negative for abdominal distention, abdominal pain and blood in stool.  Endocrine: Negative for hot flashes.  Genitourinary:  Negative for difficulty urinating and frequency.   Musculoskeletal:  Negative for arthralgias.  Skin:  Negative for itching and rash.  Neurological:  Negative for extremity weakness.  Hematological:  Negative for adenopathy.  Psychiatric/Behavioral:  Negative for confusion.      MEDICAL HISTORY:  Past Medical History:  Diagnosis Date   Chest pain    Constipation    Family history of breast cancer    Family history of colon cancer    Family history of ovarian cancer    8/19 and 9/21 cancer genetic testing  letter sent   Family history of ovarian cancer    Heartburn    High blood pressure    Hot flashes    "pretty much gone"   Hypercholesteremia    Hyperlipidemia    Insomnia    Joint pain    Liver problem    MGUS (monoclonal gammopathy of unknown significance)    sees Dr. Cathie Hoops with hematology   MGUS (monoclonal gammopathy of unknown significance)    Nerve pain    in hands and feet   Palpitations    SOB (shortness of breath) on exertion    Unspecified lump in the left breast, unspecified quadrant    surgery scheduled for 07/15/20   Vitamin D deficiency 2016    SURGICAL HISTORY: Past Surgical History:  Procedure Laterality Date   ABDOMINAL HYSTERECTOMY  2001   APPENDECTOMY  1982   BREAST BIOPSY Left 1999   cyst,benign   BREAST BIOPSY Left 03/15/2019   [name] bx venus clip radial scar   BREAST LUMPECTOMY WITH RADIOACTIVE SEED LOCALIZATION Left 07/15/2020   Procedure: LEFT BREAST LUMPECTOMY WITH RADIOACTIVE SEED LOCALIZATION;  Surgeon: Griselda Miner, MD;  Location: [facility];  Service: General;  Laterality: Left;   CATARACT EXTRACTION W/PHACO Right 11/22/2022   Procedure: CATARACT EXTRACTION PHACO AND INTRAOCULAR LENS PLACEMENT (IOC) RIGHT  6.00  00:31.8;  Surgeon: Galen Manila, MD;  Location: [facility];  Service: Ophthalmology;  Laterality: Right;   CATARACT EXTRACTION W/PHACO Left 12/06/2022   Procedure: CATARACT EXTRACTION PHACO AND INTRAOCULAR LENS PLACEMENT (IOC) LEFT 5.36 00:33.7;  Surgeon: Galen Manila, MD;  Location: [facility];  Service: Ophthalmology;  Laterality: Left;   CESAREAN SECTION  1985   COLONOSCOPY  06/13/2012   [place]. Normal Exam.   COLONOSCOPY WITH PROPOFOL N/A 10/26/2021   Procedure: COLONOSCOPY WITH PROPOFOL;  Surgeon: Wyline Mood, MD;  Location: [facility] END[facility];  Service: Gastroenterology;  Laterality: N/A;   HYSTERECTOMY ABDOMINAL WITH SALPINGECTOMY     [facility]     OOPHORECTOMY     OVARY SURGERY  2000    SOCIAL  HISTORY: Social History   Socioeconomic History   Marital status: Married    Spouse name: Not on file   Number of children: 3   Years of education: Not on file   Highest education level: Not on file  Occupational History   Occupation: International aid/development worker  Tobacco Use   Smoking status: Never   Smokeless tobacco: Never  Vaping Use   Vaping status: Never Used  Substance and Sexual Activity   Alcohol use: No    Alcohol/week: 0.0 standard drinks of alcohol   Drug use: No   Sexual activity: Not Currently  Other Topics Concern   Not on file  Social History Narrative   Lives at home with husband    Right handed   Caffeine: maybe 1 cup maybe 3 days a week   Social Determinants of Health   Financial Resource Strain: Low Risk  (01/03/2023)   Received [facility]   Overall Financial Resource Strain (CARDIA)    Difficulty of Paying Living Expenses: Not hard at all  Food Insecurity: No Food Insecurity (01/03/2023)   Received from [facility]   Hunger  Vital Sign    Worried About Programme researcher, broadcasting/film/video in the Last Year: Never true    Ran Out of Food in the Last Year: Never true  Transportation Needs: No Transportation Needs (01/03/2023)   Received from [facility] - Transportation    In the past 12 months, has lack of transportation kept you from medical appointments or from getting medications?: No    Lack of Transportation (Non-Medical): No  Physical Activity: Not on file  Stress: Not on file  Social Connections: Not on file  Intimate Partner Violence: Not on file    FAMILY HISTORY: Family History  Problem Relation Age of Onset   Colon cancer Father 74   Heart disease Father    Cancer Father    Alcoholism Father    Congestive Heart Failure Mother    Emphysema Mother    Hypertension Mother    Kidney failure Mother    Neuropathy Mother    Hyperlipidemia Mother    Depression Mother    Anxiety disorder  Mother    Obesity Mother    Ovarian cancer Sister 72       pat 1/2 sister   Breast cancer Cousin 59   Breast cancer Cousin     ALLERGIES:  has No Known Allergies.  MEDICATIONS:  Current Outpatient Medications  Medication Sig Dispense Refill   amLODipine (NORVASC) 10 MG tablet Take 1 tablet by mouth daily.     Apple Cid Vn-Grn Tea-Bit Or-Cr (APPLE CIDER VINEGAR PLUS PO) Take by mouth. (Patient not taking: Reported on 05/15/2023)     Biotin 44010 MCG TABS Take 1 each by mouth daily. (Patient not taking: Reported on 11/16/2022)     Cinnamon 500 MG TABS Take by mouth. (Patient not taking: Reported on 11/16/2022)     Multiple Vitamins-Minerals (WOMENS MULTIVITAMIN PO) Take by mouth. (Patient not taking: Reported on 11/15/2022)     Omega-3 Fatty Acids (OMEGA 3 PO) Take 1,200 mg by mouth daily. (Patient not taking: Reported on 11/15/2022)     vitamin E 1000 UNIT capsule Take 1,000 Units by mouth daily. (Patient not taking: Reported on 11/15/2022)     No current facility-administered medications for this visit.     PHYSICAL EXAMINATION: ECOG PERFORMANCE STATUS: 0 - Asymptomatic Vitals:   05/15/23 1302  BP: (!) 147/81  Pulse: 67  Resp: 18  Temp: (!) 97.5 F (36.4 C)  SpO2: 98%   Filed Weights   05/15/23 1302  Weight: 180 lb 3.2 oz (81.7 kg)    Physical Exam Constitutional:      General: She is not in acute distress. HENT:     Head: Normocephalic and atraumatic.  Eyes:     General: No scleral icterus. Cardiovascular:     Rate and Rhythm: Normal rate.  Pulmonary:     Effort: Pulmonary effort is normal. No respiratory distress.     Breath sounds: No wheezing.  Abdominal:     General: There is no distension.  Musculoskeletal:        General: No deformity. Normal range of motion.     Cervical back: Normal range of motion and neck supple.  Skin:    Findings: No erythema or rash.  Neurological:     Mental Status: She is alert and oriented to person, place, and time. Mental  status is at baseline.     Cranial Nerves: No cranial nerve deficit.  Psychiatric:        Mood and Affect: Mood  normal.      LABORATORY DATA:  I have reviewed the data as listed Lab Results  Component Value Date   WBC 6.4 07/08/2021   HGB 14.4 07/08/2021   HCT 42.2 07/08/2021   MCV 88 07/08/2021   PLT 231 07/08/2021   No results for input(s): "NA", "K", "CL", "CO2", "GLUCOSE", "BUN", "CREATININE", "CALCIUM", "GFRNONAA", "GFRAA", "PROT", "ALBUMIN", "AST", "ALT", "ALKPHOS", "BILITOT", "BILIDIR", "IBILI" in the last 8760 hours. Iron/TIBC/Ferritin/ %Sat No results found for: "IRON", "TIBC", "FERRITIN", "IRONPCTSAT"   09/27/19 M protein 0.9 04/01/2020 M protein 1.1 09/29/2020 M protein 1, IgG 1415, hemoglobin 14.2, creatinine 0.95 04/12/2021, M protein 0.9, IgG 1495, hemoglobin 14.7, creatinine 0.95, free light chain ratio 3.6. 10/18/2021, M protein 1.1, IgG 1532, free light ratio, 3.94, hemoglobin 14.1, creatinine 0.85. 24-hour urine protein electrophoresis showed no M protein. 05/04/22 M protein 1.2 light chain ratio 4.04 05/08/2023 M protein 1, light chain ration 3.94 RADIOGRAPHIC STUDIES: I have personally reviewed the radiological images as listed and agreed with the findings in the report.  No results found.

## 2023-05-15 NOTE — Assessment & Plan Note (Signed)
#   IgG MGUS [facility] results were reviewed and discussed with patient Stable kidney function and no anemia. M protein-1, light chain ratio increased to 3.94. stable.  Recommend continue observation.  Repeat labs in 6 months. [facility] prescription was provided.

## 2023-05-17 ENCOUNTER — Encounter: Payer: Self-pay | Admitting: Oncology

## 2023-05-23 ENCOUNTER — Ambulatory Visit
Admission: RE | Admit: 2023-05-23 | Discharge: 2023-05-23 | Disposition: A | Discharge status: Home / Self Care | Payer: Managed Care, Other (non HMO) | Source: Ambulatory Visit | Attending: Obstetrics & Gynecology | Admitting: Obstetrics & Gynecology

## 2023-05-23 DIAGNOSIS — R921 Mammographic calcification found on diagnostic imaging of breast: Secondary | ICD-10-CM | POA: Diagnosis present

## 2023-06-02 ENCOUNTER — Other Ambulatory Visit: Payer: Self-pay | Admitting: Obstetrics & Gynecology

## 2023-06-02 DIAGNOSIS — R921 Mammographic calcification found on diagnostic imaging of breast: Secondary | ICD-10-CM

## 2023-06-16 ENCOUNTER — Encounter: Payer: Self-pay | Admitting: Physician Assistant

## 2023-06-20 ENCOUNTER — Ambulatory Visit
Admission: RE | Admit: 2023-06-20 | Discharge: 2023-06-20 | Disposition: A | Discharge status: Home / Self Care | Payer: Managed Care, Other (non HMO) | Source: Ambulatory Visit | Attending: Obstetrics & Gynecology | Admitting: Obstetrics & Gynecology

## 2023-06-20 DIAGNOSIS — N6012 Diffuse cystic mastopathy of left breast: Secondary | ICD-10-CM | POA: Insufficient documentation

## 2023-06-20 DIAGNOSIS — R921 Mammographic calcification found on diagnostic imaging of breast: Secondary | ICD-10-CM | POA: Diagnosis present

## 2023-06-20 HISTORY — PX: BREAST BIOPSY: SHX20

## 2023-06-20 MED ORDER — LIDOCAINE 1 % OPTIME INJ - NO CHARGE
5.0000 mL | Freq: Once | INTRAMUSCULAR | Status: AC
  Administered 2023-06-20: 5 mL
  Filled 2023-06-20: qty 6

## 2023-06-20 MED ORDER — LIDOCAINE HCL (PF) 1 % IJ SOLN
10.0000 mL | Freq: Once | INTRAMUSCULAR | Status: AC
  Administered 2023-06-20: 10 mL

## 2023-06-20 MED ORDER — LIDOCAINE HCL (PF) 2 % IJ SOLN
10.0000 mL | Freq: Once | INTRAMUSCULAR | Status: AC
  Administered 2023-06-20: 10 mL

## 2023-06-21 LAB — SURGICAL PATHOLOGY

## 2023-11-13 ENCOUNTER — Ambulatory Visit: Payer: Managed Care, Other (non HMO) | Admitting: Oncology

## 2023-11-17 ENCOUNTER — Telehealth: Payer: Self-pay | Admitting: *Deleted

## 2023-11-17 NOTE — Telephone Encounter (Signed)
 Order form faxed to [facility] on [address]. Pt aware of this.

## 2023-11-17 NOTE — Telephone Encounter (Signed)
 Patient called today and said that she needs another [facility] paper because she lost it.  She says that she can come by and get the paper or you could email it to her.  She apologized for her losing the paper.

## 2023-11-29 ENCOUNTER — Inpatient Hospital Stay: Admitting: Oncology

## 2023-11-30 ENCOUNTER — Encounter: Payer: Self-pay | Admitting: Oncology

## 2023-12-05 ENCOUNTER — Encounter: Payer: Self-pay | Admitting: Oncology

## 2023-12-05 ENCOUNTER — Inpatient Hospital Stay: Attending: Oncology | Admitting: Oncology

## 2023-12-05 VITALS — BP 137/79 | HR 67 | Temp 98.5°F | Resp 18 | Wt 187.9 lb

## 2023-12-05 DIAGNOSIS — Z803 Family history of malignant neoplasm of breast: Secondary | ICD-10-CM | POA: Diagnosis not present

## 2023-12-05 DIAGNOSIS — N6489 Other specified disorders of breast: Secondary | ICD-10-CM | POA: Diagnosis not present

## 2023-12-05 DIAGNOSIS — Z800 Family history of malignant neoplasm of digestive organs: Secondary | ICD-10-CM | POA: Insufficient documentation

## 2023-12-05 DIAGNOSIS — D472 Monoclonal gammopathy: Secondary | ICD-10-CM | POA: Diagnosis not present

## 2023-12-05 DIAGNOSIS — Z8041 Family history of malignant neoplasm of ovary: Secondary | ICD-10-CM | POA: Diagnosis not present

## 2023-12-05 NOTE — Progress Notes (Signed)
 Hematology/Oncology follow up  note [phone] [phone]   Patient Care Team: Epifanio Alm SQUIBB, MD as PCP - General (Infectious Diseases) Arloa Lamar SQUIBB, MD as PCP - OBGYN (Obstetrics and Gynecology) Fleeta Milks, Lamar ORN, MD (Unknown Physician Specialty) Dessa, Reyes ORN, MD (General Surgery) Babara Call, MD as Consulting Physician (Oncology)  ASSESSMENT & PLAN:   MGUS (monoclonal gammopathy of unknown significance) # IgG kappa MGUS [facility] results were reviewed and discussed with patient M protein spike 1, free kappa 36.6, lambda 9.1, ratio 4.03. Stable kidney function and no anemia.  Recommend continue observation.  Repeat labs in 6 months. [facility] prescription was provided.  Radial scar of breast #Radial scar/complex sclerosing lesion 2022, status post resection.  Genetic testing negative.  Continue annual mammogram, patient gets image ordered via primary care provider's office.   left breast calcification December mammogram result, biopsy results were reviewed with patient. Recommend patient to get bilateral screening mammogram in June 2025.  She will call primary care doctor's office.   Follow up in 6 months.  All questions were answered. The patient knows to call the clinic with any problems, questions or concerns.  Call Babara, MD, PhD [facility] Hematology Oncology 12/05/2023    CHIEF COMPLAINTS/REASON FOR VISIT:  Follow-up for MGUS  HISTORY OF PRESENTING ILLNESS:  Andrea Jones is a 63 y.o. female presents for follow-up of MGUS.  neurology work-up for foot numbness and tingling.SABRA  SPEP showed 0.9 g/dL M spike,   , and IFE showed IgG Kappa monoclonal protein.  No aggravating or alleviated factors.  Associated signs or symptoms:  Neuropathy: Toe numbness and tingling. Denies weight loss, fever chills, bone pain, Bone pain: Denies Anemia denies  Patient also recently had an abnormal screening mammogram on 02/22/2019 and the patient  was called back to perform unilateral left diagnostic breast mammogram on 03/06/2019 Showed irregular hypoechoic mass 0.6 x 0.4 x 0.7 cm in the left breast is suspicious for malignancy.  Axillary lymph node status was not commented on mammogram or targeted ultrasound. Patient is status post left breast mass biopsy-03/15/2019, biopsy showed radial scar/complex sclerosing lesion.  # 03/15/2019 left breast biopsy showed radial scar/complex sclerosing lesion # Radial scar/complex sclerosing lesion, status post resection.  Family history of ovarian cancer and breast cancer.  Patient has met genetic counselor and genetic testing is negative.  Pregnancy / Birth History Age at menarche   15 years. Age of menopause   72-50 Gravida   3 Maternal age   73-25 Para   3  INTERVAL HISTORY Andrea Jones is a 63 y.o. female who has above history reviewed by me today presents for follow up visit for management of MGUS, history of radial scar Patient had a blood work done at [facility].  She has no new complaints  05/23/2023, left breast diagnostic mammogram showed 2.5 cm group of lung calcification. Patient underwent biopsy of the left breast upper central position with 1 clip-pathology showed benign breast parenchyma with adenosis, fibrocystic changes and rare focal usual ductal hyperplasia.  Organizing fat necrosis and associated coarse calcification consistent with prior surgical related changes.  Negative for malignancy.  Review of Systems  Constitutional:  Negative for appetite change, chills, fatigue and fever.  HENT:   Negative for hearing loss and voice change.   Eyes:  Negative for eye problems.  Respiratory:  Negative for chest tightness and cough.   Cardiovascular:  Negative for chest pain.  Gastrointestinal:  Negative for abdominal distention, abdominal pain and blood  in stool.  Endocrine: Negative for hot flashes.  Genitourinary:  Negative for difficulty urinating and frequency.    Musculoskeletal:  Negative for arthralgias.  Skin:  Negative for itching and rash.  Neurological:  Negative for extremity weakness.  Hematological:  Negative for adenopathy.  Psychiatric/Behavioral:  Negative for confusion.      MEDICAL HISTORY:  Past Medical History:  Diagnosis Date   Chest pain    Constipation    Family history of breast cancer    Family history of colon cancer    Family history of ovarian cancer    8/19 and 9/21 cancer genetic testing letter sent   Family history of ovarian cancer    Heartburn    High blood pressure    Hot flashes    pretty much gone   Hypercholesteremia    Hyperlipidemia    Insomnia    Joint pain    Liver problem    MGUS (monoclonal gammopathy of unknown significance)    sees Dr. Babara with hematology   MGUS (monoclonal gammopathy of unknown significance)    Nerve pain    in hands and feet   Palpitations    SOB (shortness of breath) on exertion    Unspecified lump in the left breast, unspecified quadrant    surgery scheduled for 07/15/20   Vitamin D  deficiency 2016    SURGICAL HISTORY: Past Surgical History:  Procedure Laterality Date   ABDOMINAL HYSTERECTOMY  2001   APPENDECTOMY  1982   BREAST BIOPSY Left 1999   cyst,benign   BREAST BIOPSY Left 03/15/2019   us  bx venus clip radial scar   BREAST BIOPSY Left 06/20/2023   left breast stereo calcs ribbon clip path pending   BREAST BIOPSY Left 06/20/2023   MM LT BREAST BX W LOC DEV 1ST LESION IMAGE BX SPEC STEREO GUIDE 06/20/2023 [facility]   BREAST LUMPECTOMY WITH RADIOACTIVE SEED LOCALIZATION Left 07/15/2020   Procedure: LEFT BREAST LUMPECTOMY WITH RADIOACTIVE SEED LOCALIZATION;  Surgeon: Curvin Deward MOULD, MD;  Location: [facility];  Service: General;  Laterality: Left;   CATARACT EXTRACTION W/PHACO Right 11/22/2022   Procedure: CATARACT EXTRACTION PHACO AND INTRAOCULAR LENS PLACEMENT (IOC) RIGHT  6.00  00:31.8;  Surgeon: Jaye Fallow, MD;  Location:  [facility];  Service: Ophthalmology;  Laterality: Right;   CATARACT EXTRACTION W/PHACO Left 12/06/2022   Procedure: CATARACT EXTRACTION PHACO AND INTRAOCULAR LENS PLACEMENT (IOC) LEFT 5.36 00:33.7;  Surgeon: Jaye Fallow, MD;  Location: [facility];  Service: Ophthalmology;  Laterality: Left;   CESAREAN SECTION  1985   COLONOSCOPY  06/13/2012   [place]. Normal Exam.   COLONOSCOPY WITH PROPOFOL  N/A 10/26/2021   Procedure: COLONOSCOPY WITH PROPOFOL ;  Surgeon: Therisa Bi, MD;  Location: [facility];  Service: Gastroenterology;  Laterality: N/A;   HYSTERECTOMY ABDOMINAL WITH SALPINGECTOMY     mgus     OOPHORECTOMY     OVARY SURGERY  2000    SOCIAL HISTORY: Social History   Socioeconomic History   Marital status: Married    Spouse name: Not on file   Number of children: 3   Years of education: Not on file   Highest education level: Not on file  Occupational History   Occupation: International aid/development worker  Tobacco Use   Smoking status: Never   Smokeless tobacco: Never  Vaping Use   Vaping status: Never Used  Substance and Sexual Activity   Alcohol use: No    Alcohol/week: 0.0 standard drinks of alcohol   Drug use:  No   Sexual activity: Not Currently  Other Topics Concern   Not on file  Social History Narrative   Lives at home with husband    Right handed   Caffeine: maybe 1 cup maybe 3 days a week   Social Drivers of Corporate investment banker Strain: Low Risk  (07/18/2023)   Received from [facility]   Overall Financial Resource Strain (CARDIA)    Difficulty of Paying Living Expenses: Not hard at all  Food Insecurity: No Food Insecurity (07/18/2023)   Received from [facility]   Hunger Vital Sign    Within the past 12 months, you worried that your food would run out before you got the money to buy more.: Never true    Within the past 12 months, the food you bought just didn't last and you didn't have money to get  more.: Never true  Transportation Needs: No Transportation Needs (07/18/2023)   Received from [facility] - Transportation    In the past 12 months, has lack of transportation kept you from medical appointments or from getting medications?: No    Lack of Transportation (Non-Medical): No  Physical Activity: Not on file  Stress: Not on file  Social Connections: Not on file  Intimate Partner Violence: Not on file    FAMILY HISTORY: Family History  Problem Relation Age of Onset   Colon cancer Father 52   Heart disease Father    Cancer Father    Alcoholism Father    Congestive Heart Failure Mother    Emphysema Mother    Hypertension Mother    Kidney failure Mother    Neuropathy Mother    Hyperlipidemia Mother    Depression Mother    Anxiety disorder Mother    Obesity Mother    Ovarian cancer Sister 32       pat 1/2 sister   Breast cancer Cousin 63   Breast cancer Cousin     ALLERGIES:  has no known allergies.  MEDICATIONS:  Current Outpatient Medications  Medication Sig Dispense Refill   amLODipine  (NORVASC ) 10 MG tablet Take 1 tablet by mouth daily.     Vitamin D , Ergocalciferol , (DRISDOL ) 1.25 MG (50000 UNIT) CAPS capsule Take 50,000 Units by mouth once a week.     Apple Cid Vn-Grn Tea-Bit Or-Cr (APPLE CIDER VINEGAR PLUS PO) Take by mouth. (Patient not taking: Reported on 12/05/2023)     Biotin 89999 MCG TABS Take 1 each by mouth daily. (Patient not taking: Reported on 12/05/2023)     Cinnamon 500 MG TABS Take by mouth. (Patient not taking: Reported on 12/05/2023)     Multiple Vitamins-Minerals (WOMENS MULTIVITAMIN PO) Take by mouth. (Patient not taking: Reported on 12/05/2023)     Omega-3 Fatty Acids (OMEGA 3 PO) Take 1,200 mg by mouth daily. (Patient not taking: Reported on 12/05/2023)     vitamin E 1000 UNIT capsule Take 1,000 Units by mouth daily. (Patient not taking: Reported on 12/05/2023)     No current facility-administered medications for this  visit.     PHYSICAL EXAMINATION: ECOG PERFORMANCE STATUS: 0 - Asymptomatic Vitals:   12/05/23 1317 12/05/23 1324  BP: (!) 152/80 137/79  Pulse: 67   Resp: 18   Temp: 98.5 F (36.9 C)   SpO2: 97%    Filed Weights   12/05/23 1317  Weight: 187 lb 14.4 oz (85.2 kg)    Physical Exam Constitutional:  General: She is not in acute distress. HENT:     Head: Normocephalic and atraumatic.   Eyes:     General: No scleral icterus.   Cardiovascular:     Rate and Rhythm: Normal rate.  Pulmonary:     Effort: Pulmonary effort is normal. No respiratory distress.     Breath sounds: No wheezing.  Abdominal:     General: There is no distension.   Musculoskeletal:        General: No deformity. Normal range of motion.     Cervical back: Normal range of motion and neck supple.   Skin:    Findings: No erythema or rash.   Neurological:     Mental Status: She is alert and oriented to person, place, and time. Mental status is at baseline.     Cranial Nerves: No cranial nerve deficit.   Psychiatric:        Mood and Affect: Mood normal.      LABORATORY DATA:  I have reviewed the data as listed Lab Results  Component Value Date   WBC 6.4 07/08/2021   HGB 14.4 07/08/2021   HCT 42.2 07/08/2021   MCV 88 07/08/2021   PLT 231 07/08/2021   No results for input(s): NA, K, CL, CO2, GLUCOSE, BUN, CREATININE, CALCIUM , GFRNONAA, GFRAA, PROT, ALBUMIN, AST, ALT, ALKPHOS, BILITOT, BILIDIR, IBILI in the last 8760 hours. Iron/TIBC/Ferritin/ %Sat No results found for: IRON, TIBC, FERRITIN, IRONPCTSAT   09/27/19 M protein 0.9 04/01/2020 M protein 1.1 09/29/2020 M protein 1, IgG 1415, hemoglobin 14.2, creatinine 0.95 04/12/2021, M protein 0.9, IgG 1495, hemoglobin 14.7, creatinine 0.95, free light chain ratio 3.6. 10/18/2021, M protein 1.1, IgG 1532, free light ratio, 3.94, hemoglobin 14.1, creatinine 0.85. 24-hour urine protein electrophoresis  showed no M protein. 05/04/22 M protein 1.2 light chain ratio 4.04 05/08/2023 M protein 1, light chain ration 3.94 RADIOGRAPHIC STUDIES: I have personally reviewed the radiological images as listed and agreed with the findings in the report.  No results found.

## 2023-12-05 NOTE — Assessment & Plan Note (Addendum)
#   IgG kappa MGUS [facility] results were reviewed and discussed with patient M protein spike 1, free kappa 36.6, lambda 9.1, ratio 4.03. Stable kidney function and no anemia.  Recommend continue observation.  Repeat labs in 6 months. [facility] prescription was provided.

## 2023-12-05 NOTE — Assessment & Plan Note (Signed)
#  Radial scar/complex sclerosing lesion 2022, status post resection.  Genetic testing negative.  Continue annual mammogram, patient gets image ordered via primary care provider's office.   left breast calcification December mammogram result, biopsy results were reviewed with patient. Recommend patient to get bilateral screening mammogram in June 2025.  She will call primary care doctor's office.

## 2024-01-17 ENCOUNTER — Other Ambulatory Visit: Payer: Self-pay | Admitting: Obstetrics & Gynecology

## 2024-01-17 DIAGNOSIS — Z1231 Encounter for screening mammogram for malignant neoplasm of breast: Secondary | ICD-10-CM

## 2024-02-12 ENCOUNTER — Ambulatory Visit
Admission: RE | Admit: 2024-02-12 | Discharge: 2024-02-12 | Disposition: A | Discharge status: Home / Self Care | Source: Ambulatory Visit | Attending: Obstetrics & Gynecology | Admitting: Obstetrics & Gynecology

## 2024-02-12 DIAGNOSIS — Z1231 Encounter for screening mammogram for malignant neoplasm of breast: Secondary | ICD-10-CM | POA: Diagnosis present

## 2024-04-16 ENCOUNTER — Other Ambulatory Visit: Payer: Self-pay | Admitting: Infectious Diseases

## 2024-04-16 DIAGNOSIS — E7849 Other hyperlipidemia: Secondary | ICD-10-CM

## 2024-04-16 DIAGNOSIS — I10 Essential (primary) hypertension: Secondary | ICD-10-CM

## 2024-04-23 ENCOUNTER — Ambulatory Visit
Admission: RE | Admit: 2024-04-23 | Discharge: 2024-04-23 | Disposition: A | Discharge status: Home / Self Care | Payer: Self-pay | Source: Ambulatory Visit | Attending: Infectious Diseases | Admitting: Infectious Diseases

## 2024-04-23 DIAGNOSIS — I10 Essential (primary) hypertension: Secondary | ICD-10-CM | POA: Insufficient documentation

## 2024-04-23 DIAGNOSIS — E7849 Other hyperlipidemia: Secondary | ICD-10-CM | POA: Insufficient documentation

## 2024-06-10 ENCOUNTER — Inpatient Hospital Stay: Admitting: Oncology

## 2024-06-26 ENCOUNTER — Inpatient Hospital Stay: Payer: Self-pay | Admitting: Oncology

## 2024-08-08 ENCOUNTER — Inpatient Hospital Stay: Payer: Self-pay | Admitting: Oncology
